# Patient Record
Sex: Female | Born: 1995 | Race: White | Hispanic: No | Marital: Married | Smoking: Former smoker
Health system: Southern US, Community
[De-identification: ages and names within clinical notes are randomized; demographics above are authoritative.]

## PROBLEM LIST (undated history)

## (undated) DIAGNOSIS — F329 Major depressive disorder, single episode, unspecified: Secondary | ICD-10-CM

## (undated) DIAGNOSIS — I10 Essential (primary) hypertension: Secondary | ICD-10-CM

## (undated) DIAGNOSIS — N200 Calculus of kidney: Secondary | ICD-10-CM

## (undated) DIAGNOSIS — R51 Headache: Secondary | ICD-10-CM

## (undated) DIAGNOSIS — N2589 Other disorders resulting from impaired renal tubular function: Secondary | ICD-10-CM

## (undated) DIAGNOSIS — K219 Gastro-esophageal reflux disease without esophagitis: Secondary | ICD-10-CM

## (undated) DIAGNOSIS — Z973 Presence of spectacles and contact lenses: Secondary | ICD-10-CM

## (undated) DIAGNOSIS — F419 Anxiety disorder, unspecified: Secondary | ICD-10-CM

## (undated) DIAGNOSIS — G43909 Migraine, unspecified, not intractable, without status migrainosus: Secondary | ICD-10-CM

## (undated) DIAGNOSIS — F32A Depression, unspecified: Secondary | ICD-10-CM

## (undated) DIAGNOSIS — F411 Generalized anxiety disorder: Secondary | ICD-10-CM

## (undated) DIAGNOSIS — Q8740 Marfan's syndrome, unspecified: Secondary | ICD-10-CM

## (undated) DIAGNOSIS — Z87448 Personal history of other diseases of urinary system: Secondary | ICD-10-CM

## (undated) DIAGNOSIS — Q87410 Marfan's syndrome with aortic dilation: Secondary | ICD-10-CM

## (undated) DIAGNOSIS — Z8279 Family history of other congenital malformations, deformations and chromosomal abnormalities: Secondary | ICD-10-CM

## (undated) DIAGNOSIS — E282 Polycystic ovarian syndrome: Secondary | ICD-10-CM

## (undated) DIAGNOSIS — Z87442 Personal history of urinary calculi: Secondary | ICD-10-CM

## (undated) DIAGNOSIS — R519 Headache, unspecified: Secondary | ICD-10-CM

## (undated) DIAGNOSIS — G43101 Migraine with aura, not intractable, with status migrainosus: Secondary | ICD-10-CM

## (undated) DIAGNOSIS — R011 Cardiac murmur, unspecified: Secondary | ICD-10-CM

## (undated) HISTORY — DX: Essential (primary) hypertension: I10

## (undated) HISTORY — PX: FRACTURE SURGERY: SHX138

## (undated) HISTORY — DX: Calculus of kidney: N20.0

## (undated) HISTORY — DX: Headache: R51

## (undated) HISTORY — DX: Headache, unspecified: R51.9

## (undated) HISTORY — DX: Migraine, unspecified, not intractable, without status migrainosus: G43.909

## (undated) HISTORY — PX: OTHER SURGICAL HISTORY: SHX169

## (undated) HISTORY — DX: Other disorders resulting from impaired renal tubular function: N25.89

## (undated) HISTORY — PX: CYSTOSCOPY WITH URETEROSCOPY AND STENT PLACEMENT: SHX6377

## (undated) HISTORY — DX: Depression, unspecified: F32.A

## (undated) HISTORY — DX: Major depressive disorder, single episode, unspecified: F32.9

---

## 2015-01-14 HISTORY — PX: EXTRACORPOREAL SHOCK WAVE LITHOTRIPSY: SHX1557

## 2017-04-08 ENCOUNTER — Ambulatory Visit (INDEPENDENT_AMBULATORY_CARE_PROVIDER_SITE_OTHER): Payer: No Typology Code available for payment source | Admitting: Family Medicine

## 2017-04-08 ENCOUNTER — Telehealth: Payer: Self-pay

## 2017-04-08 ENCOUNTER — Encounter: Payer: Self-pay | Admitting: Family Medicine

## 2017-04-08 VITALS — BP 122/82 | HR 104 | Temp 98.0°F | Ht 69.0 in | Wt 235.2 lb

## 2017-04-08 DIAGNOSIS — G43809 Other migraine, not intractable, without status migrainosus: Secondary | ICD-10-CM

## 2017-04-08 DIAGNOSIS — Z87442 Personal history of urinary calculi: Secondary | ICD-10-CM | POA: Diagnosis not present

## 2017-04-08 DIAGNOSIS — Z8279 Family history of other congenital malformations, deformations and chromosomal abnormalities: Secondary | ICD-10-CM | POA: Diagnosis not present

## 2017-04-08 DIAGNOSIS — G43909 Migraine, unspecified, not intractable, without status migrainosus: Secondary | ICD-10-CM | POA: Insufficient documentation

## 2017-04-08 MED ORDER — PROPRANOLOL HCL ER 60 MG PO CP24: 60.0000 mg | ORAL_CAPSULE | Freq: Every day | ORAL | 5 refills | Status: DC

## 2017-04-08 MED ORDER — SUMATRIPTAN SUCCINATE 100 MG PO TABS: 100.0000 mg | ORAL_TABLET | ORAL | 2 refills | Status: DC | PRN

## 2017-04-08 MED ORDER — ZOLMITRIPTAN 2.5 MG PO TABS: 2.5000 mg | ORAL_TABLET | ORAL | 2 refills | Status: DC | PRN

## 2017-04-08 MED FILL — PROPRANOLOL ER 60 MG CAP: 60 | 30 days supply | Qty: 30 | Fill #0

## 2017-04-08 MED FILL — SUMATRIPTAN SUCC 100 MG TAB: 100 | 30 days supply | Qty: 8 | Fill #0

## 2017-04-08 NOTE — Patient Instructions (Addendum)
Call Center for [facility] at [facility] at [phone] for an appointment.  They are located at [address], [address], [place], [place], [phone] (right across the hall from our office).  If you do not hear anything about your urology referral in the next 1-2 weeks, call our office and ask for an update. The echo appointment should be scheduled earlier.   Let us know if you need anything.

## 2017-04-08 NOTE — Progress Notes (Signed)
Pre visit review using our clinic review tool, if applicable. No additional management support is needed unless otherwise documented below in the visit note. 

## 2017-04-08 NOTE — Progress Notes (Signed)
Chief Complaint  Patient presents with  . Establish Care       New Patient Visit SUBJECTIVE: HPI: Carol Pugh is an 22 y.o.female who is being seen for establishing care.  [facility].  The patient has a history of kidney stones.  She has had 23 symptomatic stones in her life.  She follows with urology for this issue.  Her previous urologist had her on potassium citrate which she did not feel was helpful.  She is not following up with that provider because her insurance changed.  She would like another referral.  She does not have an active stone that is passing.  She does report 5 stones that are a symptomatic.  Her father has a history of Marfan syndrome.  She did not undergo genetic testing, however does show some aspects of the disease while she lacks others.  The cardiology team has been monitoring bi annual echocardiograms.  She is wondering if I can start managing this or refer her back to a cardiologist.  The cardiology team also was prescribing propanolol which helped for both migraines and blood pressure/tachycardia.  No Known Allergies  Past Medical History:  Diagnosis Date  . Depression   . Distal renal tubular acidosis   . Headache   . Hypertension   . Kidney stones   . Migraine    Past Surgical History:  Procedure Laterality Date  . NO PAST SURGERIES     Social History   Socioeconomic History  . Marital status: Married   Family History  Problem Relation Age of Onset  . Marfan syndrome Father      Current Outpatient Medications:  .  buPROPion (WELLBUTRIN XL) 150 MG 24 hr tablet, Take 150 mg by mouth daily., Disp: , Rfl:  .  EPINEPHrine 0.3 mg/0.3 mL IJ SOAJ injection, Inject into the muscle once., Disp: , Rfl:  .  Potassium Citrate 15 MEQ (1620 MG) TBCR, Take 2 by mouth twice daily, Disp: , Rfl:  .  propranolol ER (INDERAL LA) 60 MG 24 hr capsule, Take 1 capsule (60 mg total) by mouth daily., Disp: 30 capsule, Rfl: 5 .   SUMAtriptan (IMITREX) 100 MG tablet, Take 1 tablet (100 mg total) by mouth every 2 (two) hours as needed for migraine. May repeat in 2 hours if headache persists or recurs., Disp: 10 tablet, Rfl: 2  ROS Cardiovascular: Denies chest pain  Respiratory: Denies dyspnea   OBJECTIVE: BP 122/82 (BP Location: Left Arm, Patient Position: Sitting, Cuff Size: Large)   Pulse (!) 104   Temp 98 F (36.7 C) (Oral)   Ht 5\' 9"  (1.753 m)   Wt 235 lb 4 oz (106.7 kg)   SpO2 96%   BMI 34.74 kg/m   Constitutional: -  VS reviewed -  Well developed, well nourished, appears stated age  -She does not appear to have a Marfan habitus -  No apparent distress  Psychiatric: -  Oriented to person, place, and time -  Memory intact -  Affect and mood normal -  Fluent conversation, good eye contact -  Judgment and insight age appropriate  Eye: -  Conjunctivae clear, no discharge -  Pupils symmetric, round, reactive to light  ENMT: -  MMM    Pharynx moist, no exudate, no erythema  Neck: -  No gross swelling, no palpable masses -  Thyroid midline, not enlarged, mobile, no palpable masses  Cardiovascular: -  RRR, no bruits -  No LE edema  Respiratory: -  Normal respiratory effort, no accessory muscle use, no retraction -  Breath sounds equal, no wheezes, no ronchi, no crackles  Musculoskeletal: -  No clubbing, no cyanosis -  Gait normal  Skin: -  No significant lesion on inspection -  Warm and dry to palpation   ASSESSMENT/PLAN: History of kidney stones - Plan: Ambulatory referral to Urology  Family history of Marfan syndrome - Plan: ECHOCARDIOGRAM COMPLETE  Other migraine without status migrainosus, not intractable - Plan: propranolol ER (INDERAL LA) 60 MG 24 hr capsule  Patient instructed to sign release of records form from her previous PCP. Refer to urology, check echo, restart propranolol. Patient should return at earliest convenience for complete physical.  The patient voiced understanding and  agreement to the plan.   Jilda Roche [place], DO 04/08/17  4:47 PM

## 2017-04-08 NOTE — Telephone Encounter (Signed)
Zolmitriptan not covered. Preferred alternatives: sumatriptan or rizatriptan.

## 2017-04-13 ENCOUNTER — Encounter: Payer: Self-pay | Admitting: Family Medicine

## 2017-04-16 ENCOUNTER — Ambulatory Visit (HOSPITAL_BASED_OUTPATIENT_CLINIC_OR_DEPARTMENT_OTHER)
Admission: RE | Admit: 2017-04-16 | Discharge: 2017-04-16 | Disposition: A | Discharge status: Home / Self Care | Payer: No Typology Code available for payment source | Source: Ambulatory Visit | Attending: Family Medicine | Admitting: Family Medicine

## 2017-04-16 DIAGNOSIS — I10 Essential (primary) hypertension: Secondary | ICD-10-CM | POA: Insufficient documentation

## 2017-04-16 DIAGNOSIS — Z8279 Family history of other congenital malformations, deformations and chromosomal abnormalities: Secondary | ICD-10-CM | POA: Diagnosis not present

## 2017-04-16 NOTE — Progress Notes (Signed)
  Echocardiogram 2D Echocardiogram has been performed.  Carol Pugh Carol Pugh 04/16/2017, 2:56 PM

## 2017-06-02 ENCOUNTER — Other Ambulatory Visit: Payer: Self-pay | Admitting: Urology

## 2017-06-04 ENCOUNTER — Ambulatory Visit (INDEPENDENT_AMBULATORY_CARE_PROVIDER_SITE_OTHER): Payer: No Typology Code available for payment source | Admitting: Medical

## 2017-06-04 ENCOUNTER — Encounter (HOSPITAL_COMMUNITY): Payer: Self-pay | Admitting: *Deleted

## 2017-06-04 ENCOUNTER — Encounter: Payer: Self-pay | Admitting: Medical

## 2017-06-04 ENCOUNTER — Other Ambulatory Visit: Payer: Self-pay

## 2017-06-04 VITALS — BP 123/83 | HR 68 | Temp 97.9°F | Resp 16 | Ht 69.0 in | Wt 247.8 lb

## 2017-06-04 DIAGNOSIS — R002 Palpitations: Secondary | ICD-10-CM

## 2017-06-04 DIAGNOSIS — Z01818 Encounter for other preprocedural examination: Secondary | ICD-10-CM | POA: Diagnosis not present

## 2017-06-04 NOTE — Patient Instructions (Addendum)
Your exam today and EKG look good.  Preop EKG showed normal sinus rhythm.  No abnormality.  However on further discussion your events of pulse rate randomly reaching 170 and other times as low as 39-40 are of concern.  I would recommend that you stay on your low-dose beta-blocker.  But would be hesitant to increase dose as you do report some events of bradycardia.  I went ahead and place referral for cardiology and asked that they can see you Tuesday of this coming week.  I am hoping that we can get you in quickly but we might have to delay clearance for your urologic procedure as we need to get full understanding regarding your erratic heart rate/palpitations.  I am going to ask [facility] our referal coordinator to work on Geneticist, molecular tomorrow.  Hopefully will give you update  by tomorrow.  However I do not know if cardiologist office is open tomorrow.  Follow-up date to be determined,  Also I think you might benefit from finger pulse ox monitor.  If you are at work would recommend checking your pulse if you become symptomatic.  If any severe low pulse rate or high pulse rate with severe symptoms and recommend ED evaluation.

## 2017-06-04 NOTE — Progress Notes (Signed)
Subjective:    Patient ID: Carol Pugh, female    DOB: 10-06-1995, 22 y.o.   MRN: [id number]  HPI  Pt is a nurse in NICU.   Pt in for evaluation. She is going to get lithotripsy done in about 2 weeks. She has hx of kidney stone.  Pt has history of kidney stones in past. Total number in past was 21. Pt has no current pain presently. But has known 6 mm stone on left side. Pt is planning to try to get pregnant in near future and wants to avoid potential problems with stone in event stone were to move while she was pregnant.  Pt had pregnancy test on Monday and was negative(at urologist office) Pt states her cycle are very irregular in the past. Sometimes very frequent and some  2 months between cycles.  No history of asthma, no wheezing or diagnosed cardiac problems. Pt dad had marfans. Pt had work up and no known abnormality. Work up included echo.Echo reviewed today.  No bruising or bleeding disorders.   Pt dad passed away from aneurysm of the heart. He had a lot of marfan findings.  Dr. Carmelia Roller made the referral for echo.  Pt states sometimes per her apple watch her pulse will increase to 170. Other times down to 40. She feels her heart palpitating occasionally. Happens every couple of day. These events are happening at rest. Pt does not drinking any coffee or sudafed.  Pt has been on propanolol for years since 22 yo. Bp has been well controlled now.   Pt states easily will see pulse range 140-160. Highest pulse 170. She will randomly see 39-40. These readings are on her aplewatch.   One event she thought she would pass out.  Pt can't wear her apple watch at work.    Review of Systems  Constitutional: Negative for chills, diaphoresis, fatigue and fever.  Respiratory: Negative for cough, chest tightness, shortness of breath and wheezing.   Cardiovascular: Positive for palpitations. Negative for chest pain and leg swelling.  Gastrointestinal: Negative for abdominal  distention, abdominal pain and anal bleeding.  Musculoskeletal: Negative for back pain.  Skin: Negative for rash.  Neurological: Negative for dizziness, weakness, numbness and headaches.  Hematological: Negative for adenopathy. Does not bruise/bleed easily.  Psychiatric/Behavioral: Negative for behavioral problems and confusion.    Past Medical History:  Diagnosis Date  . Depression   . Distal renal tubular acidosis   . Family history of Marfan syndrome   . Headache   . History of kidney stones   . Hypertension   . Kidney stones   . Migraine      Social History   Socioeconomic History  . Marital status: Married    Spouse name: Not on file  . Number of children: Not on file  . Years of education: Not on file  . Highest education level: Not on file  Occupational History  . Not on file  Social Needs  . Financial resource strain: Not on file  . Food insecurity:    Worry: Not on file    Inability: Not on file  . Transportation needs:    Medical: Not on file    Non-medical: Not on file  Tobacco Use  . Smoking status: Current Every Day Smoker    Packs/day: 0.50    Years: 5.00    Pack years: 2.50    Types: Cigarettes  . Smokeless tobacco: Never Used  Substance and Sexual Activity  . Alcohol use:  Not Currently    Frequency: Never  . Drug use: Never  . Sexual activity: Not on file  Lifestyle  . Physical activity:    Days per week: Not on file    Minutes per session: Not on file  . Stress: Not on file  Relationships  . Social connections:    Talks on phone: Not on file    Gets together: Not on file    Attends religious service: Not on file    Active member of club or organization: Not on file    Attends meetings of clubs or organizations: Not on file    Relationship status: Not on file  . Intimate partner violence:    Fear of current or ex partner: Not on file    Emotionally abused: Not on file    Physically abused: Not on file    Forced sexual activity: Not on  file  Other Topics Concern  . Not on file  Social History Narrative  . Not on file    Past Surgical History:  Procedure Laterality Date  . urinary stent placements      Family History  Problem Relation Age of Onset  . Marfan syndrome Father     No Known Allergies  Current Outpatient Medications on File Prior to Visit  Medication Sig Dispense Refill  . buPROPion (WELLBUTRIN XL) 150 MG 24 hr tablet Take 150 mg by mouth daily.    Marland Kitchen EPINEPHrine 0.3 mg/0.3 mL IJ SOAJ injection Inject into the muscle once.    . Potassium Citrate 15 MEQ (1620 MG) TBCR Take 2 by mouth twice daily    . propranolol ER (INDERAL LA) 60 MG 24 hr capsule Take 1 capsule (60 mg total) by mouth daily. 30 capsule 5  . SUMAtriptan (IMITREX) 100 MG tablet Take 1 tablet (100 mg total) by mouth every 2 (two) hours as needed for migraine. May repeat in 2 hours if headache persists or recurs. 10 tablet 2  . aspirin-acetaminophen-caffeine (EXCEDRIN MIGRAINE) 250-250-65 MG tablet Take 2 tablets by mouth every 6 (six) hours as needed for headache.     No current facility-administered medications on file prior to visit.     BP 123/83   Pulse 68   Temp 97.9 F (36.6 C) (Oral)   Resp 16   Ht  (1.753 m)   Wt 247 lb 12.8 oz (112.4 kg)   SpO2 100%   BMI 36.59 kg/m       Objective:   Physical Exam  General Mental Status- Alert. General Appearance- Not in acute distress.    Neck Carotid Arteries- Normal color. Moisture- Normal Moisture. No carotid bruits. No JVD.  Chest and Lung Exam Auscultation: Breath Sounds:-Normal.  Cardiovascular Auscultation:Rythm- Regular. Murmurs & Other Heart Sounds:Auscultation of the heart reveals- No Murmurs.  Abdomen Inspection:-Inspeection Normal. Palpation/Percussion:Note:No mass. Palpation and Percussion of the abdomen reveal- Non Tender, Non Distended + BS, no rebound or guarding.   Neurologic Cranial Nerve exam:- CN III-XII intact(No nystagmus), symmetric  smile. Strength:- 5/5 equal and symmetric strength both upper and lower extremities.  Lower ext- no pedal edema. Symmetric calves.    Assessment & Plan:  Your exam today and EKG look good.  Preop EKG showed normal sinus rhythm.  No abnormality.  However on further discussion your events of pulse rate randomly reaching 170 and other times as low as 39-40 are of concern.  I would recommend that you stay on your low-dose beta-blocker.  But would be hesitant to increase  dose as you do report some events of bradycardia.  I went ahead and place referral for cardiology and asked that they can see you Tuesday of this coming week.  I am hoping that we can get you in quickly but we might have to delay clearance for your urologic procedure as we need to get full understanding regarding your erratic heart rate/palpitations.  I am going to ask [facility] our referal coordinator to work on Geneticist, molecular tomorrow.  Hopefully will give you update  by tomorrow.  However I do not know if cardiologist office is open tomorrow.  Follow-up date to be determined,  Also I think you might benefit from finger pulse ox monitor.  If you are at work would recommend checking your pulse if you become symptomatic.  If any severe low pulse rate or high pulse rate with severe symptoms and recommend ED evaluation.  Esperanza Richters, PA-C

## 2017-06-09 ENCOUNTER — Encounter: Payer: Self-pay | Admitting: Cardiology

## 2017-06-09 ENCOUNTER — Ambulatory Visit (INDEPENDENT_AMBULATORY_CARE_PROVIDER_SITE_OTHER): Payer: No Typology Code available for payment source | Admitting: Cardiology

## 2017-06-09 VITALS — BP 126/82 | HR 79 | Ht 69.0 in | Wt 248.0 lb

## 2017-06-09 DIAGNOSIS — Z8279 Family history of other congenital malformations, deformations and chromosomal abnormalities: Secondary | ICD-10-CM

## 2017-06-09 DIAGNOSIS — I10 Essential (primary) hypertension: Secondary | ICD-10-CM | POA: Insufficient documentation

## 2017-06-09 DIAGNOSIS — R000 Tachycardia, unspecified: Secondary | ICD-10-CM

## 2017-06-09 DIAGNOSIS — Z87442 Personal history of urinary calculi: Secondary | ICD-10-CM | POA: Diagnosis not present

## 2017-06-09 NOTE — Patient Instructions (Signed)
Medication Instructions:  Your physician recommends that you continue on your current medications as directed. Please refer to the Current Medication list given to you today.  Labwork: None  Testing/Procedures: Your physician has recommended that you wear a holter monitor. Holter monitors are medical devices that record the heart's electrical activity. Doctors most often use these monitors to diagnose arrhythmias. Arrhythmias are problems with the speed or rhythm of the heartbeat. The monitor is a small, portable device. You can wear one while you do your normal daily activities. This is usually used to diagnose what is causing palpitations/syncope (passing out).  Follow-Up: Your physician recommends that you schedule a follow-up appointment in: 3 months  Any Other Special Instructions Will Be Listed Below (If Applicable).     If you need a refill on your cardiac medications before your next appointment, please call your pharmacy.   [facility]  Garey Ham, RN, BSN

## 2017-06-09 NOTE — Progress Notes (Signed)
Cardiology Office Note:    Date:  06/09/2017   ID:  Carol Pugh, DOB 11/25/1995, MRN [id number]  PCP:  Sharlene Dory, DO  Cardiologist:  Garwin Brothers, MD   Referring MD: Esperanza Richters, PA-C    ASSESSMENT:    1. Tachycardia   2. Essential hypertension   3. History of kidney stones   4. Family history of Marfan syndrome    PLAN:    In order of problems listed above:  1. I reassured the patient about my findings.  In view of history of Marfan's syndrome in the family, I reviewed her echocardiogram done recently and that was unremarkable.  Her blood pressure and heart rates are stable.  In view of her her mentioning that her heart rate gets low at times I will do a 48-hour Holter monitor.  I do not think that this should cause any source of concern as far as I for her lithotripsy and she should be good to go ahead with it. 2. Patient will be seen in follow-up appointment in 3 months or earlier if the patient has any concerns    Medication Adjustments/Labs and Tests Ordered: Current medicines are reviewed at length with the patient today.  Concerns regarding medicines are outlined above.  Orders Placed This Encounter  Procedures  . HOLTER MONITOR - 48 HOUR   No orders of the defined types were placed in this encounter.    History of Present Illness:    Carol Pugh is a 22 y.o. female who is being seen today for the evaluation of palpitations and family history of Marfan syndrome at the request of [place], [place]on [place], [place].  Patient is a pleasant 21 year old female.  She has past medical history of essential hypertension.  She also has history of resting tachycardia and therefore she is on a beta-blocker since the past 4 or 5 years approximately.  She mentions to me that she tolerates this medication well and has had no issues with palpitations or tachycardia.  She was told that she has resting elevated heart rate in the past and the beta-blocker has helped her.   Now she mentions to me that she is planning to undergo lithotripsy.  She tells me that on her apple watch occasionally her heart rate will go down in the 40s.  She is completely asymptomatic from this.  No chest pain orthopnea or PND.  No history of palpitations.  No history of syncope.  At the time of my evaluation, the patient is alert awake oriented and in no distress.  Past Medical History:  Diagnosis Date  . Depression   . Distal renal tubular acidosis   . Family history of Marfan syndrome   . Headache   . History of kidney stones   . Hypertension   . Kidney stones   . Migraine     Past Surgical History:  Procedure Laterality Date  . urinary stent placements      Current Medications: Current Meds  Medication Sig  . aspirin-acetaminophen-caffeine (EXCEDRIN MIGRAINE) 250-250-65 MG tablet Take 2 tablets by mouth every 6 (six) hours as needed for headache.  Marland Kitchen buPROPion (WELLBUTRIN XL) 150 MG 24 hr tablet Take 150 mg by mouth daily.  Marland Kitchen EPINEPHrine 0.3 mg/0.3 mL IJ SOAJ injection Inject into the muscle once.  . Potassium Citrate 15 MEQ (1620 MG) TBCR Take 2 by mouth twice daily  . propranolol ER (INDERAL LA) 60 MG 24 hr capsule Take 1 capsule (60 mg total) by  mouth daily.  . SUMAtriptan (IMITREX) 100 MG tablet Take 1 tablet (100 mg total) by mouth every 2 (two) hours as needed for migraine. May repeat in 2 hours if headache persists or recurs.     Allergies:   Patient has no known allergies.   Social History   Socioeconomic History  . Marital status: Married    Spouse name: Not on file  . Number of children: Not on file  . Years of education: Not on file  . Highest education level: Not on file  Occupational History  . Not on file  Social Needs  . Financial resource strain: Not on file  . Food insecurity:    Worry: Not on file    Inability: Not on file  . Transportation needs:    Medical: Not on file    Non-medical: Not on file  Tobacco Use  . Smoking status: Current  Every Day Smoker    Packs/day: 0.50    Years: 5.00    Pack years: 2.50    Types: Cigarettes  . Smokeless tobacco: Never Used  Substance and Sexual Activity  . Alcohol use: Not Currently    Frequency: Never  . Drug use: Never  . Sexual activity: Not on file  Lifestyle  . Physical activity:    Days per week: Not on file    Minutes per session: Not on file  . Stress: Not on file  Relationships  . Social connections:    Talks on phone: Not on file    Gets together: Not on file    Attends religious service: Not on file    Active member of club or organization: Not on file    Attends meetings of clubs or organizations: Not on file    Relationship status: Not on file  Other Topics Concern  . Not on file  Social History Narrative  . Not on file     Family History: The patient's family history includes Marfan syndrome in her father.  ROS:   Please see the history of present illness.    All other systems reviewed and are negative.  EKGs/Labs/Other Studies Reviewed:    The following studies were reviewed today: EKG reveals sinus rhythm and nonspecific ST changes.   Recent Labs: No results found for requested labs within last 8760 hours.  Recent Lipid Panel No results found for: CHOL, TRIG, HDL, CHOLHDL, VLDL, LDLCALC, LDLDIRECT  Physical Exam:    VS:  BP 126/82 (BP Location: Left Arm, Patient Position: Sitting, Cuff Size: Normal)   Pulse 79   Ht  (1.753 m)   Wt 248 lb (112.5 kg)   SpO2 98%   BMI 36.62 kg/m     Wt Readings from Last 3 Encounters:  06/09/17 248 lb (112.5 kg)  06/04/17 247 lb 12.8 oz (112.4 kg)  04/08/17 235 lb 4 oz (106.7 kg)     GEN: Patient is in no acute distress HEENT: Normal NECK: No JVD; No carotid bruits LYMPHATICS: No lymphadenopathy CARDIAC: S1 S2 regular, 2/6 systolic murmur at the apex. RESPIRATORY:  Clear to auscultation without rales, wheezing or rhonchi  ABDOMEN: Soft, non-tender, non-distended MUSCULOSKELETAL:  No edema;  No deformity  SKIN: Warm and dry NEUROLOGIC:  Alert and oriented x 3 PSYCHIATRIC:  Normal affect    Signed, Garwin Brothers, MD  06/09/2017 2:54 PM    [facility]

## 2017-06-10 NOTE — H&P (Signed)
Office Visit Report     06/01/2017   --------------------------------------------------------------------------------   Carol Pugh  MRN: [id number]  PRIMARY CARE:  Arva Chafe, MD  DOB: July 20, 1995, 22 year old Female  REFERRING:    SSN: [id number]  PROVIDER:  Jerilee Field, M.D.    LOCATION:  [facility] [phone]   --------------------------------------------------------------------------------   CC: I have kidney stones.  HPI: Carol Pugh is a 22 year-old female established patient who is here for renal calculi.  The problem is on both sides. This is not her first kidney stone.   She has had ureteroscopy for treatment of her stones in the past.   Patient has a long history of kidney stones. 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 Stones are calcium oxalate and calcium phosphate.   She underwent a stent and ESWL in 2017 (less pain). She doesn't recall which side. She underwent right ureteroscopy June 2018 and stent (more pain).   She's been treated with hydration and potassium citrate. She was on 30 mEq potassium citrate twice daily (15 mEq, two tabs, twice a day). Urine pH = 6. Imaging was a KUB summer of 2018 elsewhere and she considered treating the left-sided stones (largest 6 mm, but pt thought there were 11 stones) before she starts her family planning. She's a nurse in NICU.   She returns and UA is clear. KUB today reveals an 8 mm stone in the left kidney with a 2 mm stone just above it. She is well today. She wants to have kids later this year. UA clear.     ALLERGIES: None   MEDICATIONS: Epi Pen  Imitrex 100 mg tablet  Potassium Citrate Er 15 meq (1,620 mg) tablet, extended release  Propranolol Hcl Er 60 mg capsule, extended release 24hr  Wellbutrin Xl     GU PSH: None     PSH Notes: ureteroscopy  oral surgery     NON-GU PSH: None   GU PMH: Renal calculus - 04/17/2017    NON-GU  PMH: Cardiac murmur, unspecified Depression Hypertension    FAMILY HISTORY: Aortic Aneurysm - Father Kidney Stones - Grandmother   SOCIAL HISTORY: Marital Status: Married Preferred Language: English; Race: White Current Smoking Status: Patient smokes.   Tobacco Use Assessment Completed: Used Tobacco in last 30 days? Drinks 3 caffeinated drinks per day. Patient's occupation Education administrator.    REVIEW OF SYSTEMS:    GU Review Female:   Patient reports get up at night to urinate. Patient denies frequent urination, hard to postpone urination, burning /pain with urination, leakage of urine, stream starts and stops, trouble starting your stream, have to strain to urinate, and being pregnant.  Gastrointestinal (Upper):   Patient denies nausea, vomiting, and indigestion/ heartburn.  Gastrointestinal (Lower):   Patient denies diarrhea and constipation.  Constitutional:   Patient denies fever, night sweats, weight loss, and fatigue.  Skin:   Patient denies skin rash/ lesion and itching.  Eyes:   Patient denies blurred vision and double vision.  Ears/ Nose/ Throat:   Patient denies sore throat and sinus problems.  Hematologic/Lymphatic:   Patient denies swollen glands and easy bruising.  Cardiovascular:   Patient denies leg swelling and chest pains.  Respiratory:   Patient denies cough and shortness of breath.  Endocrine:   Patient denies excessive thirst.  Musculoskeletal:   Patient denies back pain and joint pain.  Neurological:   Patient denies headaches and dizziness.  Psychologic:  Patient denies depression and anxiety.   VITAL SIGNS:      06/01/2017 10:06 AM  BP 133/86 mmHg  Pulse 80 /min  Temperature 98.1 F / 36.7 C   MULTI-SYSTEM PHYSICAL EXAMINATION:    Constitutional: Well-nourished. No physical deformities. Normally developed. Good grooming.  Neck: Neck symmetrical, not swollen. Normal tracheal position.  Respiratory: No labored breathing, no use of accessory muscles. Normal  breath sounds.  Cardiovascular: Regular rate and rhythm. Normal temperature, normal extremity pulses, no swelling, no varicosities.   Neurologic / Psychiatric: Oriented to time, oriented to place, oriented to person. No depression, no anxiety, no agitation.  Gastrointestinal: No mass, no tenderness, no rigidity, non obese abdomen.     PAST DATA REVIEWED:  Source Of History:  Patient   PROCEDURES:         KUB - [id number]  A single view of the abdomen is obtained.  Calculi:  Middle pole left kidney calculi -- 7 mm, 2 mm superior and adjacent.       The bones appeared normal. The bowel gas pattern appeared normal. The soft tissues were unremarkable.         Urinalysis Dipstick Dipstick Cont'd  Color: Yellow Bilirubin: Neg  Appearance: Clear Ketones: Neg  Specific Gravity: 1.020 Blood: Neg  pH: 6.0 Protein: Neg  Glucose: Neg Urobilinogen: 0.2    Nitrites: Neg    Leukocyte Esterase: Neg    ASSESSMENT:      ICD-10 Details  1 GU:   Renal calculus - N20.0    PLAN:           Schedule Return Visit/Planned Activity: Next Available Appointment - Schedule Surgery          Document Letter(s):  Created for Patient: Clinical Summary         Notes:   left renal stones - discussed she may have other early, small stones that aren't visible on KUB. She has a left 7 mm stone. I discussed with the patient the nature risks and benefits of continued surveillance, LEFT shockwave lithotripsy or ureteroscopy. Discussed limitations of each approach, expected success, complications and staged procedure use. All questions answered. She elects to proceed with LEFT ESWL. Discussed one of my colleagues may be doing the procedure.   cc: Dr. Carmelia Roller     * Signed by Jerilee Field, M.D. on 06/01/17 at 4:37 PM (EDT)*

## 2017-06-11 ENCOUNTER — Encounter (HOSPITAL_COMMUNITY)
Admission: RE | Disposition: A | Discharge status: Home / Self Care | Payer: Self-pay | Source: Other Acute Inpatient Hospital | Attending: Urology

## 2017-06-11 ENCOUNTER — Telehealth: Payer: Self-pay | Admitting: Family Medicine

## 2017-06-11 ENCOUNTER — Ambulatory Visit (HOSPITAL_COMMUNITY): Payer: No Typology Code available for payment source

## 2017-06-11 ENCOUNTER — Encounter (HOSPITAL_COMMUNITY): Payer: Self-pay | Admitting: General Practice

## 2017-06-11 ENCOUNTER — Ambulatory Visit (HOSPITAL_COMMUNITY)
Admission: RE | Admit: 2017-06-11 | Discharge: 2017-06-11 | Disposition: A | Discharge status: Home / Self Care | Payer: No Typology Code available for payment source | Source: Other Acute Inpatient Hospital | Attending: Urology | Admitting: Urology

## 2017-06-11 DIAGNOSIS — Z79899 Other long term (current) drug therapy: Secondary | ICD-10-CM | POA: Insufficient documentation

## 2017-06-11 DIAGNOSIS — Z87442 Personal history of urinary calculi: Secondary | ICD-10-CM | POA: Diagnosis not present

## 2017-06-11 DIAGNOSIS — N200 Calculus of kidney: Secondary | ICD-10-CM | POA: Diagnosis not present

## 2017-06-11 DIAGNOSIS — I10 Essential (primary) hypertension: Secondary | ICD-10-CM | POA: Diagnosis not present

## 2017-06-11 DIAGNOSIS — F329 Major depressive disorder, single episode, unspecified: Secondary | ICD-10-CM | POA: Diagnosis not present

## 2017-06-11 HISTORY — DX: Family history of other congenital malformations, deformations and chromosomal abnormalities: Z82.79

## 2017-06-11 HISTORY — DX: Personal history of urinary calculi: Z87.442

## 2017-06-11 HISTORY — PX: EXTRACORPOREAL SHOCK WAVE LITHOTRIPSY: SHX1557

## 2017-06-11 LAB — PREGNANCY, URINE: Preg Test, Ur: NEGATIVE

## 2017-06-11 SURGERY — LITHOTRIPSY, ESWL
Anesthesia: LOCAL | Laterality: Left

## 2017-06-11 MED ORDER — SODIUM CHLORIDE 0.9 % IV SOLN
INTRAVENOUS | Status: DC
  Administered 2017-06-11: 07:00:00 via INTRAVENOUS

## 2017-06-11 MED ORDER — CIPROFLOXACIN HCL 500 MG PO TABS
500.0000 mg | ORAL_TABLET | ORAL | Status: AC
  Administered 2017-06-11: 500 mg via ORAL
  Filled 2017-06-11: qty 1

## 2017-06-11 MED ORDER — DIAZEPAM 5 MG PO TABS
10.0000 mg | ORAL_TABLET | ORAL | Status: AC
  Administered 2017-06-11: 10 mg via ORAL
  Filled 2017-06-11: qty 2

## 2017-06-11 MED ORDER — DIPHENHYDRAMINE HCL 25 MG PO CAPS
25.0000 mg | ORAL_CAPSULE | ORAL | Status: AC
  Administered 2017-06-11: 25 mg via ORAL
  Filled 2017-06-11: qty 1

## 2017-06-11 MED FILL — TAMSULOSIN HCL 0.4 MG CAP: 0.4 | 30 days supply | Qty: 30 | Fill #0

## 2017-06-11 MED FILL — ONDANSETRON HCL 4 MG TABLET: 4 | 2 days supply | Qty: 10 | Fill #0

## 2017-06-11 MED FILL — OXYCODONE-ACETAMINOPHEN 5-3: 5-325 | 2 days supply | Qty: 16 | Fill #0

## 2017-06-11 NOTE — Discharge Instructions (Signed)
1. You should strain your urine and collect all fragments and bring them to your follow up appointment.  °2. You should take your pain medication as needed.  Please call if your pain is severe to the point that it is not controlled with your pain medication. °3. You should call if you develop fever > 101 or persistent nausea or vomiting. °4. Your doctor may prescribe tamsulosin to take to help facilitate stone passage. °

## 2017-06-11 NOTE — Telephone Encounter (Signed)
OK to place referral. TY.  

## 2017-06-11 NOTE — Telephone Encounter (Signed)
Copied from CRM [id number]. Topic: Referral - Request >> Jun 11, 2017  2:31 PM Floria Raveling A wrote: Reason for CRM:  Pt called in and stated that she has the focus plan with [facility] and needs ins referral to a Gyn.  She doesn't care which one she just needs one.  Pt has not had per period in 2 weeks, per pregnancy test they were negative   Best number  [phone]

## 2017-06-11 NOTE — Op Note (Signed)
Please see scanned chart for ESWL operative note. 

## 2017-06-11 NOTE — Interval H&P Note (Signed)
History and Physical Interval Note:  06/11/2017 8:02 AM  Carol Pugh  has presented today for surgery, with the diagnosis of LEFT KIDNEY STONE  The various methods of treatment have been discussed with the patient and family. After consideration of risks, benefits and other options for treatment, the patient has consented to  Procedure(s): LEFT EXTRACORPOREAL SHOCK WAVE LITHOTRIPSY (ESWL) (Left) as a surgical intervention .  The patient's history has been reviewed, patient examined, no change in status, stable for surgery.  I have reviewed the patient's chart and labs.  Questions were answered to the patient's satisfaction.     Nikolos Billig,LES

## 2017-06-12 ENCOUNTER — Encounter (HOSPITAL_COMMUNITY): Payer: Self-pay | Admitting: Urology

## 2017-06-12 ENCOUNTER — Other Ambulatory Visit: Payer: Self-pay | Admitting: Family Medicine

## 2017-06-12 DIAGNOSIS — N926 Irregular menstruation, unspecified: Secondary | ICD-10-CM

## 2017-06-12 NOTE — Telephone Encounter (Signed)
Referral done

## 2017-06-15 ENCOUNTER — Encounter: Payer: Self-pay | Admitting: Family Medicine

## 2017-06-15 ENCOUNTER — Encounter: Payer: Self-pay | Admitting: Medical

## 2017-06-15 ENCOUNTER — Other Ambulatory Visit (INDEPENDENT_AMBULATORY_CARE_PROVIDER_SITE_OTHER): Payer: No Typology Code available for payment source

## 2017-06-15 ENCOUNTER — Telehealth: Payer: Self-pay

## 2017-06-15 ENCOUNTER — Other Ambulatory Visit: Payer: Self-pay | Admitting: Family Medicine

## 2017-06-15 DIAGNOSIS — Z3200 Encounter for pregnancy test, result unknown: Secondary | ICD-10-CM | POA: Diagnosis not present

## 2017-06-15 LAB — HCG, QUANTITATIVE, PREGNANCY

## 2017-06-15 NOTE — Telephone Encounter (Signed)
Put in order/scheduled appt with the patient for today

## 2017-06-15 NOTE — Telephone Encounter (Signed)
OK, order POCT urine preg please.

## 2017-06-15 NOTE — Telephone Encounter (Signed)
Copied from CRM [id number]. Topic: Inquiry >> Jun 15, 2017 11:17 AM Alexander BergeronBarksdale, Harvey B wrote: Reason for CRM: pt called to ask if she can have lab orders created to have blood work done to check to see if she is pregnant, call pt to advise

## 2017-06-22 ENCOUNTER — Ambulatory Visit: Payer: No Typology Code available for payment source

## 2017-06-22 DIAGNOSIS — R002 Palpitations: Secondary | ICD-10-CM | POA: Diagnosis not present

## 2017-06-23 MED FILL — PROPRANOLOL ER 60 MG CAP: 60 | 30 days supply | Qty: 30 | Fill #1

## 2017-06-24 ENCOUNTER — Other Ambulatory Visit: Payer: Self-pay | Admitting: Obstetrics and Gynecology

## 2017-06-24 DIAGNOSIS — N921 Excessive and frequent menstruation with irregular cycle: Secondary | ICD-10-CM

## 2017-06-24 MED FILL — MEDROXYPROGESTERONE 10 MG T: 10 | 10 days supply | Qty: 10 | Fill #0

## 2017-06-29 ENCOUNTER — Ambulatory Visit (HOSPITAL_COMMUNITY)
Admission: RE | Admit: 2017-06-29 | Discharge: 2017-06-29 | Disposition: A | Discharge status: Home / Self Care | Payer: No Typology Code available for payment source | Source: Ambulatory Visit | Attending: Obstetrics and Gynecology | Admitting: Obstetrics and Gynecology

## 2017-06-29 DIAGNOSIS — N921 Excessive and frequent menstruation with irregular cycle: Secondary | ICD-10-CM | POA: Diagnosis present

## 2017-07-03 ENCOUNTER — Ambulatory Visit (INDEPENDENT_AMBULATORY_CARE_PROVIDER_SITE_OTHER): Payer: No Typology Code available for payment source | Admitting: Family Medicine

## 2017-07-03 ENCOUNTER — Encounter: Payer: Self-pay | Admitting: Family Medicine

## 2017-07-03 VITALS — BP 112/76 | HR 78 | Temp 98.2°F | Ht 69.0 in | Wt 252.4 lb

## 2017-07-03 DIAGNOSIS — Z0000 Encounter for general adult medical examination without abnormal findings: Secondary | ICD-10-CM

## 2017-07-03 MED FILL — SUMATRIPTAN SUCC 100 MG TAB: 100 | 30 days supply | Qty: 8 | Fill #1

## 2017-07-03 NOTE — Patient Instructions (Addendum)
Consider Pepcid (famotidine) 20 mg twice daily or Zantac (ranitidine) 150 mg twice daily to help. Omeprazole 20 mg twice daily is a stronger alternative.   The only lifestyle changes that have data behind them are weight loss for the overweight/obese and elevating the head of the bed. Finding out which foods/positions are triggers is important.  Keep the diet clean and stay physically active.  Let us know if you need anything.

## 2017-07-03 NOTE — Progress Notes (Signed)
Pre visit review using our clinic review tool, if applicable. No additional management support is needed unless otherwise documented below in the visit note. 

## 2017-07-03 NOTE — Progress Notes (Signed)
Chief Complaint  Patient presents with  . Annual Exam     Well Woman Carol Pugh is here for a complete physical.   Her last physical was >1 year ago.  Current diet: in general, trying to improve diet. Current exercise: none currently will walk dog. No LMP recorded. Seatbelt? Yes  Health Maintenance Pap/HPV- Yes 08/13/2016 Tetanus- Yes - 11-13-16 HIV screening- Yes -08/13/17 STI screening- Yes - 08/13/17  Past Medical History:  Diagnosis Date  . Depression   . Distal renal tubular acidosis   . Family history of Marfan syndrome   . Headache   . History of kidney stones   . Hypertension   . Kidney stones   . Migraine      Past Surgical History:  Procedure Laterality Date  . EXTRACORPOREAL SHOCK WAVE LITHOTRIPSY Left 06/11/2017   Procedure: LEFT EXTRACORPOREAL SHOCK WAVE LITHOTRIPSY (ESWL);  Surgeon: Heloise Purpura, MD;  Location: [facility];  Service: Urology;  Laterality: Left;  . urinary stent placements      Medications  Current Outpatient Medications on File Prior to Visit  Medication Sig Dispense Refill  . buPROPion (WELLBUTRIN XL) 150 MG 24 hr tablet Take 150 mg by mouth daily.    Marland Kitchen EPINEPHrine 0.3 mg/0.3 mL IJ SOAJ injection Inject into the muscle once.    . Potassium Citrate 15 MEQ (1620 MG) TBCR Take 2 by mouth twice daily    . propranolol ER (INDERAL LA) 60 MG 24 hr capsule Take 1 capsule (60 mg total) by mouth daily. 30 capsule 5  . SUMAtriptan (IMITREX) 100 MG tablet Take 1 tablet (100 mg total) by mouth every 2 (two) hours as needed for migraine. May repeat in 2 hours if headache persists or recurs. 10 tablet 2   Allergies No Known Allergies  Review of Systems: Constitutional:  no unexpected weight changes Eye:  no recent significant change in vision Ear/Nose/Mouth/Throat:  Ears:  no tinnitus or vertigo and no recent change in hearing Nose/Mouth/Throat:  no complaints of nasal congestion, no sore throat Cardiovascular: no chest pain Respiratory:  +nighttime  cough, no shortness of breath Gastrointestinal:  no abdominal pain, no change in bowel habits GU:  Female: negative for dysuria or pelvic pain Musculoskeletal/Extremities:  no pain of the joints Integumentary (Skin/Breast):  no abnormal skin lesions reported Neurologic:  no headaches Endocrine:  denies fatigue Hematologic/Lymphatic:  No areas of easy bleeding  Exam BP 112/76 (BP Location: Left Arm, Patient Position: Sitting, Cuff Size: Large)   Pulse 78   Temp 98.2 F (36.8 C) (Oral)   Ht 5\' 9"  (1.753 m)   Wt 252 lb 6 oz (114.5 kg)   BMI 37.27 kg/m  General:  well developed, well nourished, in no apparent distress Skin:  no significant moles, warts, or growths Head:  no masses, lesions, or tenderness Eyes:  pupils equal and round, sclera anicteric without injection Ears:  canals without lesions, TMs shiny without retraction, no obvious effusion, no erythema Nose:  nares patent, septum midline, mucosa normal, and no drainage or sinus tenderness Throat/Pharynx:  lips and gingiva without lesion; tongue and uvula midline; non-inflamed pharynx; no exudates or postnasal drainage Neck: neck supple without adenopathy, thyromegaly, or masses Lungs:  clear to auscultation, breath sounds equal bilaterally, no respiratory distress Cardio:  regular rate and rhythm, no bruits, no LE edema Abdomen:  abdomen soft, nontender; bowel sounds normal; no masses or organomegaly Genital: Defer to GYN Musculoskeletal:  symmetrical muscle groups noted without atrophy or deformity Extremities:  no clubbing, cyanosis, or edema, no deformities, no skin discoloration Neuro:  gait normal; deep tendon reflexes normal and symmetric Psych: well oriented with normal range of affect and appropriate judgment/insight  Assessment and Plan  Well adult exam   Well 22 y.o. female. Counseled on diet and exercise. Had labs thru GYN. Try H2 blocker and/or PPI.  Follow up in 6 mo or prn. The patient voiced  understanding and agreement to the plan.  Jilda Rocheicholas Paul WessonWendling, DO 07/03/17 3:12 PM

## 2017-07-07 ENCOUNTER — Encounter: Payer: No Typology Code available for payment source | Admitting: Advanced Practice Midwife

## 2017-07-24 MED FILL — CLOMIPHENE CITRATE 50 MG TA: 50 | 5 days supply | Qty: 5 | Fill #0

## 2017-07-28 ENCOUNTER — Telehealth: Payer: Self-pay | Admitting: Family Medicine

## 2017-07-28 MED FILL — PROPRANOLOL ER 60 MG CAP: 60 | 30 days supply | Qty: 30 | Fill #2

## 2017-07-28 NOTE — Telephone Encounter (Signed)
Copied from CRM [id number]. Topic: Quick Communication - Rx Refill/Question >> Jul 28, 2017  9:21 AM Maia Pettiesrtiz, Kristie S wrote: Medication: buPROPion (WELLBUTRIN XL) 150 MG 24 hr tablet takes 1/day & Potassium Citrate 15 MEQ (1620 MG) TBCR takes 2 tabs 2/day Pt is out of medication and pharmacy does not have RX. She thought Dr. Carmelia RollerWendling was sending in after her appt in June. Please advise.  Has the patient contacted their pharmacy? yes Preferred Pharmacy (with phone number or street name): [facility] [place], [place] - [id number] [phone] (Phone) [phone] (Fax)

## 2017-07-29 MED ORDER — BUPROPION HCL ER (XL) 150 MG PO TB24: 150.0000 mg | ORAL_TABLET | Freq: Every day | ORAL | 1 refills | Status: DC

## 2017-07-29 MED ORDER — POTASSIUM CITRATE ER 15 MEQ (1620 MG) PO TBCR: EXTENDED_RELEASE_TABLET | ORAL | 1 refills | Status: DC

## 2017-07-29 MED FILL — buPROPion HCL ER (XL) 150 M: 150 | 90 days supply | Qty: 90 | Fill #0

## 2017-07-29 NOTE — Telephone Encounter (Signed)
Refill done.  

## 2017-08-31 MED FILL — PROPRANOLOL ER 60 MG CAP: 60 | 30 days supply | Qty: 30 | Fill #3

## 2017-09-07 ENCOUNTER — Ambulatory Visit (INDEPENDENT_AMBULATORY_CARE_PROVIDER_SITE_OTHER): Payer: No Typology Code available for payment source | Admitting: Family Medicine

## 2017-09-07 ENCOUNTER — Encounter: Payer: Self-pay | Admitting: Family Medicine

## 2017-09-07 VITALS — BP 108/72 | HR 98 | Temp 98.3°F | Ht 69.0 in | Wt 256.1 lb

## 2017-09-07 DIAGNOSIS — S83004A Unspecified dislocation of right patella, initial encounter: Secondary | ICD-10-CM | POA: Diagnosis not present

## 2017-09-07 MED ORDER — TRAMADOL HCL 50 MG PO TABS: 50.0000 mg | ORAL_TABLET | Freq: Three times a day (TID) | ORAL | 0 refills | Status: DC | PRN

## 2017-09-07 MED FILL — traMADol HCL 50 MG TABS: 50 | 5 days supply | Qty: 15 | Fill #0

## 2017-09-07 NOTE — Progress Notes (Signed)
Chief Complaint  Patient presents with  . Knee Pain    right knee    Subjective: Patient is a 22 y.o. female here for R knee pain.  Patient was at a wedding and her knee gave out and she fell down.  There was no injury specifically.  Her kneecap had dislocated laterally initially but was able to go back in.  It came out again and she went to the emergency department.  The provider in the emergency department tried to relocate it but it slid back out.  It was finally relocated under conscious sedation and remained in place with a pressure brace.  She was told she needs a follow-up for an orthopedic surgery referral.  She has been using hydrocodone for pain, however does make her drowsy.  She has done well on tramadol in the past.   ROS: MSK: +knee pain  Past Medical History:  Diagnosis Date  . Depression   . Distal renal tubular acidosis   . Family history of Marfan syndrome   . Headache   . History of kidney stones   . Hypertension   . Kidney stones   . Migraine     Objective: BP 108/72 (BP Location: Right Arm, Patient Position: Sitting, Cuff Size: Large)   Pulse 98   Temp 98.3 F (36.8 C) (Oral)   Ht 5\' 9"  (1.753 m)   Wt 256 lb 2 oz (116.2 kg)   SpO2 96%   BMI 37.82 kg/m  General: Awake, appears stated age Lungs: No accessory muscle use Neuro: Antalgic gait Psych: Age appropriate judgment and insight, normal affect and mood  Assessment and Plan: Dislocation of right patella, initial encounter - Plan: Ambulatory referral to Orthopedic Surgery, traMADol (ULTRAM) 50 MG tablet  Patient declined exam due to fear of re-aggravating her injury.  Short course of tramadol.  Okay to use hydrocodone at night.  Ice, ibuprofen, Tylenol, activity as tolerated. Follow-up as needed. The patient voiced understanding and agreement to the plan.  Jilda Rocheicholas Paul Big RunWendling, DO 09/07/17  4:46 PM

## 2017-09-07 NOTE — Progress Notes (Signed)
Pre visit review using our clinic review tool, if applicable. No additional management support is needed unless otherwise documented below in the visit note. 

## 2017-09-07 NOTE — Patient Instructions (Addendum)
Ice/cold pack over area for 10-15 min twice daily.  If you do not hear anything about your referral in the next few days, call our office and ask for an update.  OK to take Tylenol 1000 mg (2 extra strength tabs) or 975 mg (3 regular strength tabs) every 6 hours as needed.  Ibuprofen 400-600 mg (2-3 over the counter strength tabs) every 6 hours as needed for pain.  Let us know if you need anything.

## 2017-09-08 ENCOUNTER — Telehealth: Payer: Self-pay

## 2017-09-08 ENCOUNTER — Encounter: Payer: Self-pay | Admitting: Family Medicine

## 2017-09-08 NOTE — Telephone Encounter (Signed)
Copied from CRM #[id number]. Topic: Referral - Question >> Sep 08, 2017 11:23 AM Alexander, Amber L wrote: Reason for CRM:   Pt wants to know what orthopeadic surgeon she is being referred to. Pt can be reached at [phone] >> Sep 08, 2017 12:13 PM Fergerson, Tricia A, CMA wrote: See pt mychart message >> Sep 08, 2017  1:35 PM Barksdale, Harvey B wrote: Pt called to check any info about where the referral has went so that she can contact that office 

## 2017-09-08 NOTE — Telephone Encounter (Signed)
Copied from CRM [id number]. Topic: Referral - Question >> Sep 08, 2017 11:23 AM Baldo DaubAlexander, Amber L wrote: Reason for CRM:   Pt wants to know what orthopeadic surgeon she is being referred to. Pt can be reached at [phone] >> Sep 08, 2017 12:13 PM Kathi SimpersFergerson, Tricia A, CMA wrote: See pt mychart message >> Sep 08, 2017  1:35 PM Alexander BergeronBarksdale, Harvey B wrote: Pt called to check any info about where the referral has went so that she can contact that office

## 2017-09-08 NOTE — Telephone Encounter (Signed)
Routed to [facility] LLCGwen, Armed forces training and education officerreferral coordinator, to address.   Copied from CRM [id number]. Topic: Referral - Question >> Sep 08, 2017 11:23 AM Baldo DaubAlexander, Amber L wrote: Reason for CRM:   Pt wants to know what orthopeadic surgeon she is being referred to. Pt can be reached at [phone] >> Sep 08, 2017 12:13 PM Kathi SimpersFergerson, Tricia A, CMA wrote: See pt mychart message >> Sep 08, 2017  1:35 PM Alexander BergeronBarksdale, Harvey B wrote: Pt called to check any info about where the referral has went so that she can contact that office

## 2017-09-09 ENCOUNTER — Telehealth: Payer: Self-pay

## 2017-09-09 ENCOUNTER — Telehealth: Payer: Self-pay | Admitting: Family Medicine

## 2017-09-09 NOTE — Telephone Encounter (Signed)
I did not want to get an MRI from our end since she is going to see ortho. She had told me the ER suggested the orthopedic team may want to get one, which I agree that they may want to. TY.

## 2017-09-09 NOTE — Telephone Encounter (Signed)
Patient informed of PCP instructions. 

## 2017-09-09 NOTE — Telephone Encounter (Unsigned)
Copied from CRM [id number]. Topic: Referral - Question >> Sep 08, 2017 11:23 AM Baldo DaubAlexander, Amber L wrote: Reason for CRM:   Pt wants to know what orthopeadic surgeon she is being referred to. Pt can be reached at [phone] >> Sep 08, 2017 12:13 PM Kathi SimpersFergerson, Tricia A, CMA wrote: See pt mychart message >> Sep 08, 2017  1:35 PM Jonette EvaBarksdale, Harvey B wrote: Pt called to check any info about where the referral has went so that she can contact that office >> Sep 08, 2017  3:59 PM Terisa Starraylor, Brittany L wrote: [facility] called and said she is coming in on Thursday at 11:15. The patient called and told him that Dr Carmelia RollerWendling wanted her to have an MRI but they told her they can not do that on the first appointment. She did say that if Dr Carmelia RollerWendling wants that done to please call her. If you can not get in touch with her, and you want her to have the MRI first you can upload it in the referral section in epic.   Call back @ [phone]

## 2017-09-09 NOTE — Telephone Encounter (Signed)
Pt. calling, wanting clarification on whether or not Dr. Carmelia RollerWendling prefers pt. get her MRI done prior to appointment with [facility] at 11 tmr. 8/29. No MRI order found. Order able to be uploaded in referral section of Epic if Dr. Carmelia RollerWendling wants it done prior. Routed to Dr. Carmelia RollerWendling to advise.

## 2017-09-09 NOTE — Telephone Encounter (Signed)
Copied from CRM #[id number]. Topic: Referral - Question >> Sep 08, 2017 11:23 AM Alexander, Amber L wrote: Reason for CRM:   Pt wants to know what orthopeadic surgeon she is being referred to. Pt can be reached at [phone] >> Sep 08, 2017 12:13 PM Fergerson, Tricia A, CMA wrote: See pt mychart message >> Sep 08, 2017  1:35 PM Barksdale, Harvey B wrote: Pt called to check any info about where the referral has went so that she can contact that office >> Sep 08, 2017  3:59 PM Taylor, Brittany L wrote: Andrienne, with [facility] called and said she is coming in on Thursday at 11:15. The patient called and told him that Dr Wendling wanted her to have an MRI but they told her they can not do that on the first appointment. She did say that if Dr Wendling wants that done to please call her. If you can not get in touch with her, and you want her to have the MRI first you can upload it in the referral section in epic.   Call back @ [phone] 

## 2017-09-10 ENCOUNTER — Ambulatory Visit (INDEPENDENT_AMBULATORY_CARE_PROVIDER_SITE_OTHER): Payer: Self-pay

## 2017-09-10 ENCOUNTER — Ambulatory Visit (INDEPENDENT_AMBULATORY_CARE_PROVIDER_SITE_OTHER): Payer: No Typology Code available for payment source | Admitting: Surgery

## 2017-09-10 ENCOUNTER — Encounter (INDEPENDENT_AMBULATORY_CARE_PROVIDER_SITE_OTHER): Payer: Self-pay | Admitting: Surgery

## 2017-09-10 ENCOUNTER — Telehealth (INDEPENDENT_AMBULATORY_CARE_PROVIDER_SITE_OTHER): Payer: Self-pay | Admitting: Surgery

## 2017-09-10 ENCOUNTER — Ambulatory Visit (INDEPENDENT_AMBULATORY_CARE_PROVIDER_SITE_OTHER): Payer: Self-pay | Admitting: Orthopaedic Surgery

## 2017-09-10 VITALS — BP 139/101 | HR 95 | Ht 69.0 in | Wt 250.0 lb

## 2017-09-10 DIAGNOSIS — M25561 Pain in right knee: Secondary | ICD-10-CM

## 2017-09-10 DIAGNOSIS — S83104A Unspecified dislocation of right knee, initial encounter: Secondary | ICD-10-CM

## 2017-09-10 MED ORDER — HYDROCODONE-ACETAMINOPHEN 5-325 MG PO TABS: 1.0000 | ORAL_TABLET | Freq: Three times a day (TID) | ORAL | 0 refills | Status: DC | PRN

## 2017-09-10 NOTE — Telephone Encounter (Signed)
Per Zonia KiefJames Owens by text pt can use the note from hospital until follow up with Dr. August Saucerean to review MRI.

## 2017-09-10 NOTE — Telephone Encounter (Signed)
Patient needs a work note stating that she needs to be out of work until a certain date.  She received a note from the hospital to be out of work until the 6th.  CB#[phone].  Thank you.

## 2017-09-10 NOTE — Progress Notes (Signed)
Office Visit Note   Patient: Carol Pugh           Date of Birth: 01/12/1996           MRN: [id number] Visit Date: 09/10/2017              Requested by: Sharlene DoryWendling, Nicholas Paul, DO [address] [place], [place] [id number] PCP: Sharlene DoryWendling, Nicholas Paul, DO   Assessment & Plan: Visit Diagnoses:  1. Acute pain of right knee   2. Dislocation of right knee, initial encounter     Plan: With patient's knee injury I recommend getting a stat MRI to rule out medial patellofemoral ligament tear, ACL injury, meniscal injury.  Follow-up with Dr. August Saucerean next week to discuss results and treatment options.  Recommend that she stay in her knee immobilizer and use crutches.  Follow-Up Instructions: Return in about 1 week (around 09/17/2017) for with Dr August Saucerean to review MRI.   Orders:  Orders Placed This Encounter  Procedures  . XR Knee 1-2 Views Right  . MR Knee Right w/o contrast   No orders of the defined types were placed in this encounter.     Procedures: No procedures performed   Clinical Data: No additional findings.   Subjective: Chief Complaint  Patient presents with  . Right Knee - Follow-up    HPI 22 year old white female who is a [employer] employee comes in today with complaints of right knee pain and likely patella dislocation.  Patient states that Saturday, September 05, 2017 she was dancing at home and she felt her knee "pop" and went down to the floor.  States that she saw her kneecap shifted lateral.  She went to move her knee and patella reduced.  She continued to have ongoing pain.  EMS was called and when they got there she pivoted and her kneecap "dislocated" again.  She has never had any problems with her knee before this injury.  She was transported to the emergency room and states that she was given conscious sedation and patella again was reduced.  She was put in a knee immobilizer and advised to follow-up with orthopedics.  States that she had to go through  her primary care to get a referral to our office which caused the delay for her treatment by us. Review of Systems No current cardiac pulmonary GI GU issues  Objective: Vital Signs: BP (!) 139/101 (BP Location: Left Arm, Patient Position: Sitting)   Pulse 95   Ht 5\' 9"  (1.753 m)   Wt 250 lb (113.4 kg)   BMI 36.92 kg/m   Physical Exam  Constitutional: She is oriented to person, place, and time. No distress.  HENT:  Head: Normocephalic and atraumatic.  Eyes: Pupils are equal, round, and reactive to light. EOM are normal.  Pulmonary/Chest: She is in respiratory distress.  Musculoskeletal:  Right knee is swollen.  Knee is diffusely tender.  Markedly tender around the medial retinaculum.  I am unable to put patient's knee through range of motion or assess knee ligament stability due to her pain.  Calf is nontender.  Neurovascular intact.  Neurological: She is alert and oriented to person, place, and time.  Skin: Skin is warm and dry.    Ortho Exam  Specialty Comments:  No specialty comments available.  Imaging: No results found.   PMFS History: Patient Active Problem List   Diagnosis Date Noted  . Essential hypertension 06/09/2017  . History of kidney stones 04/08/2017  .  Family history of Marfan syndrome 04/08/2017  . Migraine 04/08/2017   Past Medical History:  Diagnosis Date  . Depression   . Distal renal tubular acidosis   . Family history of Marfan syndrome   . Headache   . History of kidney stones   . Hypertension   . Kidney stones   . Migraine     Family History  Problem Relation Age of Onset  . Marfan syndrome Father     Past Surgical History:  Procedure Laterality Date  . EXTRACORPOREAL SHOCK WAVE LITHOTRIPSY Left 06/11/2017   Procedure: LEFT EXTRACORPOREAL SHOCK WAVE LITHOTRIPSY (ESWL);  Surgeon: Heloise Purpura, MD;  Location: [facility];  Service: Urology;  Laterality: Left;  . urinary stent placements     Social History   Occupational History  . Not  on file  Tobacco Use  . Smoking status: Current Every Day Smoker    Packs/day: 0.50    Years: 5.00    Pack years: 2.50    Types: Cigarettes  . Smokeless tobacco: Never Used  Substance and Sexual Activity  . Alcohol use: Not Currently    Frequency: Never  . Drug use: Never  . Sexual activity: Not on file

## 2017-09-11 ENCOUNTER — Telehealth (INDEPENDENT_AMBULATORY_CARE_PROVIDER_SITE_OTHER): Payer: Self-pay | Admitting: Orthopedic Surgery

## 2017-09-11 NOTE — Telephone Encounter (Signed)
Patient called stating that she had an MRI at [facility] and wants to know if someone is going to read it and give her a call with the results before her MRI.  CB#[phone].  Thank you.

## 2017-09-11 NOTE — Telephone Encounter (Signed)
Please advise 

## 2017-09-15 ENCOUNTER — Telehealth (INDEPENDENT_AMBULATORY_CARE_PROVIDER_SITE_OTHER): Payer: Self-pay | Admitting: Orthopedic Surgery

## 2017-09-15 ENCOUNTER — Telehealth: Payer: Self-pay | Admitting: *Deleted

## 2017-09-15 NOTE — Telephone Encounter (Signed)
Patient called asked if Dr August Saucer can call her with the results of her MRI. Patient said the MRI was scheduled as (Stat) MRI. The number to contact patient is [phone]

## 2017-09-15 NOTE — Telephone Encounter (Signed)
IC s/w patient. Worked her in to see Dr August Saucer tomorrow a.m. Due to complexity of MRI results.

## 2017-09-15 NOTE — Telephone Encounter (Signed)
Copied from CRM [id number]. Topic: Referral - Request >> Sep 15, 2017  9:04 AM Gaynelle Adu wrote: Reason for CRM: Patient is calling to request another referral for a orthopeadic surgeon. She stated she will not be going back to [facility].  She is requesting her referral sent  to [facility]. Address: [address] [place]  Ph: [phone] She is requesting  a call back once the referral is sent. >> Sep 15, 2017 10:23 AM Leafy Ro wrote: Pt no longer needs the referral to [facility]

## 2017-09-15 NOTE — Telephone Encounter (Signed)
No she needs return office visit with Dr. August Saucer per my office note to review the study.

## 2017-09-16 ENCOUNTER — Encounter (INDEPENDENT_AMBULATORY_CARE_PROVIDER_SITE_OTHER): Payer: Self-pay | Admitting: Orthopedic Surgery

## 2017-09-16 ENCOUNTER — Telehealth (INDEPENDENT_AMBULATORY_CARE_PROVIDER_SITE_OTHER): Payer: Self-pay | Admitting: Orthopedic Surgery

## 2017-09-16 ENCOUNTER — Ambulatory Visit (INDEPENDENT_AMBULATORY_CARE_PROVIDER_SITE_OTHER): Payer: No Typology Code available for payment source | Admitting: Orthopedic Surgery

## 2017-09-16 ENCOUNTER — Telehealth: Payer: Self-pay | Admitting: Family Medicine

## 2017-09-16 ENCOUNTER — Encounter: Payer: Self-pay | Admitting: Family Medicine

## 2017-09-16 DIAGNOSIS — S83104A Unspecified dislocation of right knee, initial encounter: Secondary | ICD-10-CM

## 2017-09-16 NOTE — Telephone Encounter (Signed)
Patient left a message stating that she has a question about the rehab Dr. August Saucer is sending her to.  CB#[phone].  Thank you.

## 2017-09-16 NOTE — Telephone Encounter (Signed)
Working on getting patient brace. Patient advised will call her once I have heard back from [facility] about getting her a brace for her knee.

## 2017-09-16 NOTE — Telephone Encounter (Signed)
Copied from CRM [id number]. Topic: Quick Communication - See Telephone Encounter >> Sep 16, 2017  1:38 PM Raquel Sarna wrote: Pt had a visit with 1st referral - Dr. August Saucer - orthopedic surgeon. Pt wanting to know if she can have another referral for a second opinion.  Pt would also like to discuss what Dr. Carmelia Roller thinks on Dr. Diamantina Providence option.

## 2017-09-16 NOTE — Telephone Encounter (Signed)
The patient has appointment today with Dr. August Saucer.  She is keeping this appt. Due to her MRI results and the need to be seen asap. The problem she had was with the office staff, she stated she was not treated well.  One staff member called her back and apologized.  She will keep her appt with Dr. August Saucer, but most likely once treatment is complete will not return there for future referrals.

## 2017-09-16 NOTE — Telephone Encounter (Signed)
Patient returned call advised the [facility] is fine for therapy. Patient asked will she have the brace before her appointment on Monday? Patient asked for a call back concerning the brace. The number to contact patient is [phone]

## 2017-09-16 NOTE — Telephone Encounter (Signed)
That's fine. For my reference and future referrals, was there a specific issue?

## 2017-09-16 NOTE — Telephone Encounter (Signed)
Tried calling patient. No answer. Also responded to her my chart message asking her to call us back and let us know specifically where she would like to be referred for therapy.

## 2017-09-16 NOTE — Telephone Encounter (Signed)
Noted  

## 2017-09-17 ENCOUNTER — Telehealth: Payer: Self-pay | Admitting: Family Medicine

## 2017-09-17 ENCOUNTER — Telehealth (INDEPENDENT_AMBULATORY_CARE_PROVIDER_SITE_OTHER): Payer: Self-pay | Admitting: Orthopedic Surgery

## 2017-09-17 NOTE — Telephone Encounter (Signed)
IC s/w patient and advised I just have found a brace through [facility] but had to talk with Dr August Saucer to make sure that this is what he was wanting her to have. Will also ask Dr August Saucer if ok for patient to still participate in therapy on Monday even if she does not have the brace.

## 2017-09-17 NOTE — Telephone Encounter (Signed)
IC s/w patient and advised I just have found a brace through [facility] but had to talk with Dr Dean to make sure that this is what he was wanting her to have. Will also ask Dr Dean if ok for patient to still participate in therapy on Monday even if she does not have the brace.  

## 2017-09-17 NOTE — Telephone Encounter (Signed)
FYI

## 2017-09-17 NOTE — Telephone Encounter (Signed)
Patient called asked if there has been any word about the brace for her knee? Patient said she is having (PT) on Monday. The number to contact patient is [phone]

## 2017-09-17 NOTE — Telephone Encounter (Signed)
This was addressed in phone encounter

## 2017-09-17 NOTE — Telephone Encounter (Signed)
I reviewed her MyChart message and her relaying of the assessment/plan from Dr. August Saucer. I appreciate Dr. Diamantina Providence approach in trying to avoid surgery, but also understand where she is coming from. PT is not cheap. My recommendation would be to first let Dr. August Saucer know the concerns. My understanding is that when therapies/treatment like this have such a low success rate, "shared decision making" where the patient is very much involved with the next step, is much more important. If Dr. August Saucer is rigid in his plan, I think a second opinion is worthwhile given her concerns. TY.

## 2017-09-17 NOTE — Telephone Encounter (Signed)
Copied from CRM [id number]. Topic: Referral - Request >> Sep 17, 2017  9:41 AM Crist Infante wrote: Reason for CRM: pt states she has the [facility] plan and [facility], Dr August Saucer wants her to have PT. Her appt is Monday,9/09 at [facility] at [facility] At 1:30 pm Monday.  Pt needs before then for insurance purposes  Address: [address] [place], [place] [address] Phone: [phone]  Referral done already/spoke to the patient.

## 2017-09-17 NOTE — Addendum Note (Signed)
Addended by: Scharlene Gloss B on: 09/17/2017 10:16 AM   Modules accepted: Orders

## 2017-09-17 NOTE — Telephone Encounter (Signed)
Informed the patient of PCP instructions.  She will discuss at appointment tomorrow with Dr. August Saucer all of this. She has made a PT appt already for Monday and wants for insurance reason referral for PT to do done (will do referral)

## 2017-09-18 ENCOUNTER — Encounter (INDEPENDENT_AMBULATORY_CARE_PROVIDER_SITE_OTHER): Payer: Self-pay | Admitting: Orthopedic Surgery

## 2017-09-18 ENCOUNTER — Ambulatory Visit (INDEPENDENT_AMBULATORY_CARE_PROVIDER_SITE_OTHER): Payer: No Typology Code available for payment source | Admitting: Orthopedic Surgery

## 2017-09-18 NOTE — Progress Notes (Signed)
Office Visit Note   Patient: Carol Pugh           Date of Birth: 1995-08-04           MRN: [id number] Visit Date: 09/16/2017 Requested by: Sharlene Dory, DO [address] [place], [place] [address] PCP: Sharlene Dory, DO  Subjective: Chief Complaint  Patient presents with  . Right Knee - Follow-up    HPI: Patient presents for evaluation of right knee pain.  Date of injury was 09/05/2017.  She is had no prior patellar dislocation.  Her father did die of complications from Marfan's which was an aortic dissection.  She may have some incomplete penetrance of Marfan's.  She is 2 weeks out from injury.  She works as a Engineer, civil (consulting).  MRI scan shows evidence of patellar dislocation.  They also called ACL tear.  I think some of these fibers are intact but a little bit lax.              ROS: All systems reviewed are negative as they relate to the chief complaint within the history of present illness.  Patient denies  fevers or chills.   Assessment & Plan: Visit Diagnoses:  1. Dislocation of right knee, initial encounter     Plan: Impression is patellar dislocation right knee with pretty significant pain and reluctance to really do anything with the knee.  She is been in a knee immobilizer.  At her age and with the first time dislocation I think there is a chance that she may not require surgery.  Like to get her started in physical therapy with a patellar brace.  We will see her back in 2 weeks for clinical recheck.  Note out of work for 4 weeks.  Cannot really get a great enough exam of her today because of apprehension general reluctance to have the knee examined to make a great clinical assessment on the stability of the ACL.  We will try that again at her next clinic appointment.  Follow-Up Instructions: Return in about 2 weeks (around 09/30/2017).   Orders:  Orders Placed This Encounter  Procedures  . Ambulatory referral to Physical Therapy   No orders of  the defined types were placed in this encounter.     Procedures: No procedures performed   Clinical Data: No additional findings.  Objective: Vital Signs: There were no vitals taken for this visit.  Physical Exam:   Constitutional: Patient appears well-developed HEENT:  Head: Normocephalic Eyes:EOM are normal Neck: Normal range of motion Cardiovascular: Normal rate Pulmonary/chest: Effort normal Neurologic: Patient is alert Skin: Skin is warm Psychiatric: Patient has normal mood and affect    Ortho Exam: Ortho exam demonstrates mild effusion of the knee.  She does have some tenderness at the medial facet of the patella and medial pedal pulses palpable.  Beyond that assessment she really is reluctant to have any manipulation or examination done on the knee.  She does not seem to have much in terms of joint hyperlaxity the elbows wrist or fingers.  Specialty Comments:  No specialty comments available.  Imaging: No results found.   PMFS History: Patient Active Problem List   Diagnosis Date Noted  . Essential hypertension 06/09/2017  . History of kidney stones 04/08/2017  . Family history of Marfan syndrome 04/08/2017  . Migraine 04/08/2017   Past Medical History:  Diagnosis Date  . Depression   . Distal renal tubular acidosis   . Family history  of Marfan syndrome   . Headache   . History of kidney stones   . Hypertension   . Kidney stones   . Migraine     Family History  Problem Relation Age of Onset  . Marfan syndrome Father     Past Surgical History:  Procedure Laterality Date  . EXTRACORPOREAL SHOCK WAVE LITHOTRIPSY Left 06/11/2017   Procedure: LEFT EXTRACORPOREAL SHOCK WAVE LITHOTRIPSY (ESWL);  Surgeon: Heloise Purpura, MD;  Location: [facility];  Service: Urology;  Laterality: Left;  . urinary stent placements     Social History   Occupational History  . Not on file  Tobacco Use  . Smoking status: Current Every Day Smoker    Packs/day: 0.50     Years: 5.00    Pack years: 2.50    Types: Cigarettes  . Smokeless tobacco: Never Used  Substance and Sexual Activity  . Alcohol use: Not Currently    Frequency: Never  . Drug use: Never  . Sexual activity: Not on file

## 2017-09-18 NOTE — Telephone Encounter (Signed)
Talked with Dr August Saucer. Ok to proceed with PT even without brace. Alycia Rossetti still working with me to make arrangements to get brace for patient. I have advised patient of this as well.

## 2017-09-18 NOTE — Telephone Encounter (Signed)
Advised patient would call her as soon as I have brace in hand.

## 2017-09-21 ENCOUNTER — Ambulatory Visit: Payer: No Typology Code available for payment source | Admitting: Physical Therapy

## 2017-09-21 ENCOUNTER — Telehealth (INDEPENDENT_AMBULATORY_CARE_PROVIDER_SITE_OTHER): Payer: Self-pay | Admitting: Orthopedic Surgery

## 2017-09-21 ENCOUNTER — Ambulatory Visit (INDEPENDENT_AMBULATORY_CARE_PROVIDER_SITE_OTHER): Payer: No Typology Code available for payment source | Admitting: Orthopedic Surgery

## 2017-09-21 NOTE — Telephone Encounter (Signed)
Patient called advised the measurement for the brace is 25.5. The number to contact patient is [phone]

## 2017-09-21 NOTE — Telephone Encounter (Signed)
Sent information to [employer]

## 2017-09-21 NOTE — Telephone Encounter (Signed)
Patient wondering if brace is still supposed to come in tomorrow and also if you can give her an estimate on the cost. CB # [phone]

## 2017-09-21 NOTE — Telephone Encounter (Signed)
Emailed Ryan additional information needed to see about approval for patients brace

## 2017-09-21 NOTE — Telephone Encounter (Signed)
I have emailed Ryan to ask questions below per patient request. Waiting to be advised. IC patient and advised would let her know as soon as I knew something.

## 2017-09-21 NOTE — Telephone Encounter (Signed)
Talked with Carol Pugh and he needed measurement Patient will measure her leg per Carol Pugh she can give [name] a quad measurement 7" above her knee center. Talked with patient she will get measurement and let us know.

## 2017-09-22 ENCOUNTER — Telehealth: Payer: Self-pay | Admitting: *Deleted

## 2017-09-22 ENCOUNTER — Other Ambulatory Visit (INDEPENDENT_AMBULATORY_CARE_PROVIDER_SITE_OTHER): Payer: Self-pay | Admitting: Orthopedic Surgery

## 2017-09-22 ENCOUNTER — Telehealth (INDEPENDENT_AMBULATORY_CARE_PROVIDER_SITE_OTHER): Payer: Self-pay | Admitting: Orthopedic Surgery

## 2017-09-22 NOTE — Telephone Encounter (Signed)
I have sent email to Carol Pugh asking him to reach out to patient to give her an update on status of brace.

## 2017-09-22 NOTE — Telephone Encounter (Signed)
Copied from CRM [id number]. Topic: Quick Communication - See Telephone Encounter >> Sep 22, 2017  1:15 PM Laural Benes, [place] C wrote: CRM for notification. See Telephone encounter for: 09/22/17.  Pt called in and is requesting a referral to an ortho surgeon for a second opinion, pt says that she is okay with [facility]   CB: [phone]

## 2017-09-22 NOTE — Telephone Encounter (Signed)
Patient is still waiting to hear from [facility].  She is asking for his number. States she does not feel comfortable doing physical therapy without the brace.  States she is frustrated and would like to get an a response today.    cb  [phone]

## 2017-09-22 NOTE — Telephone Encounter (Signed)
Patient wondering if she can have # to contact [place]? Patients # [phone]

## 2017-09-22 NOTE — Telephone Encounter (Signed)
I called.

## 2017-09-22 NOTE — Telephone Encounter (Signed)
I have been diligently trying to work on this for patient and trying to get her a solution. I just received email from [facility] stating that patients insurance will not cover brace if they bill her because they are out of network. He is going to have one shipped by UPS to our office and should be delivered to [name] tomorrow. He also has been checking with all of his accounts in the [place] to see if they have one in stock that is the size/measurement that she needs and so since he was unable to locate one, he will have it sent to our office tomorrow and we will just bill patient through our office for the brace and that I will call her as soon as I have the brace in my hands. I have called patient and explained in detail. She did say that she was going to proceed with therapy appointment tomorrow even though she did not feel comfortable doing so and she felt she would not be able to do therapy.  Again apologized for the delay but that unfortunately these things take time. I did apologize again that she felt that she had been pushed off. I let her know that it was nor is it ever our intention to make our patients feel that way.  She proceeded to tell me that it was not just in regards to the brace but her care in general. She said she felt she had been uneducated in what was going on with her knee, did not understand why plan of care is for PT? She said she was never advised her ACL was ruptured that she had to read it on the MRI. She said she consulted with her grandfathers doctor who advised PT was not going to Carol Pugh anything. I suggested to patient if she was this "put out" with the care she had received she should have let me know much sooner since we had talked almost daily for the last several days and we could have certainly addressed her concerns much sooner. I did let her know that Dr August Saucer was in surgery this afternoon and would call her after he finished his case.

## 2017-09-23 ENCOUNTER — Telehealth (INDEPENDENT_AMBULATORY_CARE_PROVIDER_SITE_OTHER): Payer: Self-pay

## 2017-09-23 ENCOUNTER — Ambulatory Visit: Payer: No Typology Code available for payment source | Attending: Orthopedic Surgery | Admitting: Physical Therapy

## 2017-09-23 ENCOUNTER — Other Ambulatory Visit: Payer: Self-pay | Admitting: Family Medicine

## 2017-09-23 ENCOUNTER — Other Ambulatory Visit: Payer: Self-pay

## 2017-09-23 ENCOUNTER — Ambulatory Visit: Payer: No Typology Code available for payment source | Admitting: Physical Therapy

## 2017-09-23 ENCOUNTER — Encounter: Payer: Self-pay | Admitting: Physical Therapy

## 2017-09-23 DIAGNOSIS — R262 Difficulty in walking, not elsewhere classified: Secondary | ICD-10-CM | POA: Diagnosis present

## 2017-09-23 DIAGNOSIS — M25661 Stiffness of right knee, not elsewhere classified: Secondary | ICD-10-CM | POA: Insufficient documentation

## 2017-09-23 DIAGNOSIS — M25561 Pain in right knee: Secondary | ICD-10-CM | POA: Diagnosis present

## 2017-09-23 DIAGNOSIS — S83004A Unspecified dislocation of right patella, initial encounter: Secondary | ICD-10-CM

## 2017-09-23 DIAGNOSIS — M6281 Muscle weakness (generalized): Secondary | ICD-10-CM | POA: Insufficient documentation

## 2017-09-23 NOTE — Telephone Encounter (Signed)
Darl Pikes with [facility] called wanting to know if Dr. August Saucer had a specific protocol for therapy for patient.  Stated that they will being seeing patient today at 2:45pm.  Cb# is [phone].  Please advise.  Thank you

## 2017-09-23 NOTE — Telephone Encounter (Signed)
Brace actually received. Called patient to advise. She will come in to be fitted for brace.

## 2017-09-23 NOTE — Therapy (Addendum)
[facility] Outpatient Rehabilitation [facility] [address]  [address] [place], [place], [address] Phone: [phone]   Fax:  [phone]  Physical Therapy Evaluation  Patient Details  Name: Carol Pugh MRN: [id number] Date of Birth: Mar 12, 1995 Referring Provider: Cammy Copa, MD   Encounter Date: 09/23/2017   *all treatment was performed on 09/23/17*  PT End of Session - 09/23/17 1643    Visit Number  1    Number of Visits  13    Date for PT Re-Evaluation  10/21/17    Authorization Type  [facility]    PT Start Time  1443    PT Stop Time  1554    PT Time Calculation (min)  71 min    Activity Tolerance  Patient tolerated treatment well;Patient limited by pain    Behavior During Therapy  [employer] for tasks assessed/performed       Past Medical History:  Diagnosis Date  . Depression   . Distal renal tubular acidosis   . Family history of Marfan syndrome   . Headache   . History of kidney stones   . Hypertension   . Kidney stones   . Migraine     Past Surgical History:  Procedure Laterality Date  . EXTRACORPOREAL SHOCK WAVE LITHOTRIPSY Left 06/11/2017   Procedure: LEFT EXTRACORPOREAL SHOCK WAVE LITHOTRIPSY (ESWL);  Surgeon: Heloise Purpura, MD;  Location: [facility];  Service: Urology;  Laterality: Left;  . urinary stent placements      There were no vitals filed for this visit.   Subjective Assessment - 09/23/17 1445    Subjective  Patient reports on 09/05/17 R patella dislocated while she was dancing- leg buckled and it felt like it popped. Fell to the floor and then it had popped back in. Dislocated again and ER MD reduced patella under conscious sedation. Placed in immobilizer for 2.5 weeks. Today with new Donjoy brace to be worn at all times- also using 2 crutches for ambulation. Has another F/U with Dr. August Saucer next week. Unsure if she will have surgery or not. 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    Patient is accompained by:  Family member   grandmother   Pertinent History  distal renal tibular acidosis, family hx of Marfan syndrome, kidney stones, HTN, migraine    Limitations  Sitting;House hold activities;Lifting;Standing;Walking    How long can you sit comfortably?  unlimited if R LE propped on pillow    How long can you stand comfortably?  10 min    How long can you walk comfortably?  variable 5-15 min    Diagnostic tests  per patient MRI R knee 09/08/16: showed patellofemoral lig tear and MCL sprain and ACL rupture     Patient Stated Goals  be able to walk right; get back to work    Currently in Pain?  Yes    Pain Score  2     Pain Location  Knee    Pain Orientation  Right    Pain Descriptors / Indicators  Sharp;Shooting;Constant   pulling   Pain Type  Acute pain    Aggravating Factors   bending knee, putting weight through knee, pressure over patella and medial knee, rotating hip    Pain Relieving Factors  meds         [facility] PT Assessment - 09/23/17  1504      Assessment   Medical Diagnosis  Dislocation of R knee    Referring Provider  Cammy Copa, MD    Onset Date/Surgical Date  09/05/17    Next MD Visit  09/30/17    Prior Therapy  No      Precautions   Precautions  Knee    Precaution Comments  bledsoe brace on R knee      Restrictions   Weight Bearing Restrictions  No      Balance Screen   Has the patient fallen in the past 6 months  No    Has the patient had a decrease in activity level because of a fear of falling?   No    Is the patient reluctant to leave their home because of a fear of falling?   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    Leisure  hiking      Cognition   Overall Cognitive Status  Within Functional Limits for tasks assessed      Observation/Other Assessments   Focus on Therapeutic Outcomes (FOTO)   Knee: 32 (68% limited, 39% predicted)      Sensation   Light Touch  Appears Intact   notes numbess over R medial claf from previous accident     Coordination   Gross Motor Movements are Fluid and Coordinated  Yes      Posture/Postural Control   Posture/Postural Control  Postural limitations    Postural Limitations  Weight shift left      ROM / Strength   AROM / PROM / Strength  Strength;AROM;PROM      AROM   AROM Assessment Site  Knee    Right/Left Knee  Right;Left    Right Knee Extension  0    Right Knee Flexion  44   limited by pain   Left Knee Extension  0    Left Knee Flexion  132      PROM   PROM Assessment Site  Knee    Right/Left Knee  Right;Left    Right Knee Extension  0    Right Knee Flexion  47   limited by pain   Left Knee Extension  0    Left Knee Flexion  134      Strength   Strength Assessment Site  Hip;Knee;Ankle    Right/Left Hip  Left;Right    Right Hip Flexion  2/5    Right Hip ABduction  3/5    Right Hip ADduction  3/5    Left Hip Flexion  4+/5    Left Hip ABduction  4+/5    Left Hip ADduction  4+/5    Right/Left Knee  Left;Right    Right Knee Flexion  2/5    Right Knee Extension  2/5    Left Knee Flexion  4+/5    Left Knee Extension  4+/5    Right/Left Ankle  Right;Left    Right Ankle Plantar Flexion  4/5    Right Ankle Inversion  4/5    Left Ankle Dorsiflexion  4+/5    Left Ankle Plantar Flexion  4+/5      Flexibility   Soft Tissue Assessment /Muscle Length  yes    Hamstrings  midly tight B      Palpation   Patella mobility  R  knee good movement, avoiding lateral glide    Palpation comment  TTP and significant swelling over patella and medial knee      Ambulation/Gait   Assistive device  Crutches    Gait Pattern  Step-to pattern;Step-through pattern;Decreased hip/knee flexion - right;Decreased weight shift to right;Decreased stance time - right;Decreased step length - left                Objective measurements completed on examination: See above findings.      [facility] Adult PT Treatment/Exercise - 09/23/17 0001      Modalities   Modalities Vasopneumatic device to R knee (15 min, medium compression, coldest temp)            PT Education - 09/23/17 1642    Education Details  prognosis, POC, HEP    Person(s) Educated  Patient    Methods  Explanation;Demonstration;Tactile cues;Verbal cues;Handout    Comprehension  Returned demonstration;Verbalized understanding       PT Short Term Goals - 09/23/17 1813      PT SHORT TERM GOAL #1   Title  Patient independent with initial HEP.    Time  2    Period  Weeks    Status  New    Target Date  10/07/17        PT Long Term Goals - 09/23/17 1814      PT LONG TERM GOAL #1   Title  Patient to be independnet with advanced HEP.    Time  4    Period  Weeks    Status  New    Target Date  10/21/17      PT LONG TERM GOAL #2   Title  Patient to demonstrate R knee AROM/PROM 0-120 without pain limiting.    Time  4    Period  Weeks    Status  New    Target Date  10/21/17      PT LONG TERM GOAL #3   Title  Patient to demonstrate B LE strength >=4+/5.    Time  4    Period  Weeks    Status  New    Target Date  10/21/17      PT LONG TERM GOAL #4   Title  Patient to demonstrate equal step length, weight shift, and knee flexion throughout ambulation with LRAD.    Time  4    Period  Weeks    Status  New    Target Date  10/21/17             Plan - 09/23/17 1803    Clinical Impression Statement  Patient is a 22y/o F presenting to OPPT  with R knee pain after repeated patellar dislocations after initial event on 09/05/17. Pain is located centrally over patella as well as medial knee. Per- patient MRI showed MTFL tear, ACL tear, and MCL tear. MD and patient discussing surgery. Patient was placed in immobilizer, now with lateral patella stabilizing Bledsoe and 2 crutches for ambulation WBAT. Aggravating factors include R knee flexion,  WBing, laying supine. Patient today with significant swelling over and surrounding patella as well as medial knee. Limited R LE strength and patient very apprehensive and painful with R knee flexion. Educated on and administered gentle strengthening HEP handout with assistance to perform SLR as patient unable to perform herself. Patient reported understanding. Ended session with [employer] to R knee to reduce pain and edema. Normal integumentary response observed. Encouraged patient to continue icing knee at home. Would benefit from skilled PT services 3x/week for 4 weeks to address aforementioned impairments.     Clinical Presentation  Stable    Clinical Decision Making  Low    Rehab Potential  Good    PT Frequency  3x / week    PT Duration  4 weeks    PT Treatment/Interventions  ADLs/Self Care Home Management;Cryotherapy;Electrical Stimulation;Iontophoresis 4mg /ml Dexamethasone;Moist Heat;Ultrasound;Gait training;Stair training;Functional mobility training;DME Instruction;Therapeutic activities;Therapeutic exercise;Manual techniques;Patient/family education;Neuromuscular re-education;Balance training;Passive range of motion;Dry needling;Energy conservation;Scar mobilization;Splinting;Taping;Vasopneumatic Device    PT Next Visit Plan  reassess HEP    Consulted and Agree with Plan of Care  Patient       Patient will benefit from skilled therapeutic intervention in order to improve the following deficits and impairments:  Increased edema, Decreased activity tolerance, Decreased strength, Pain, Decreased  mobility, Difficulty walking, Decreased range of motion, Impaired flexibility, Hypermobility  Visit Diagnosis: Acute pain of right knee - Plan: PT plan of care cert/re-cert  Stiffness of right knee, not elsewhere classified - Plan: PT plan of care cert/re-cert  Muscle weakness (generalized) - Plan: PT plan of care cert/re-cert  Difficulty in walking, not elsewhere classified - Plan: PT plan of care cert/re-cert     Problem List Patient Active Problem List   Diagnosis Date Noted  . Essential hypertension 06/09/2017  . History of kidney stones 04/08/2017  . Family history of Marfan syndrome 04/08/2017  . Migraine 04/08/2017    Anette Guarneri, PT, DPT 09/24/17 7:47 AM  [facility] [address]  [address] [place], [place], [address] Phone: [phone]   Fax:  [phone]  Name: MEGHANA TULLO MRN: [id number] Date of Birth: 1995/04/10

## 2017-09-23 NOTE — Telephone Encounter (Signed)
Brace is at my desk. Patient will need to sign form stating received it. She will be here some time today to have brace applied.

## 2017-09-23 NOTE — Telephone Encounter (Signed)
Referral done

## 2017-09-23 NOTE — Telephone Encounter (Signed)
I replied to her in basket message that she had sent advising that no specific protocol, only needed to work on SLR and bending knee.

## 2017-09-23 NOTE — Telephone Encounter (Signed)
OK to place referral to orthoEulah Pont and W; dx dislocation of R patella. TY.

## 2017-09-28 ENCOUNTER — Ambulatory Visit: Payer: No Typology Code available for payment source

## 2017-09-28 MED FILL — CLOMIPHENE CITRATE 50 MG TA: 50 | 5 days supply | Qty: 5 | Fill #1

## 2017-09-28 MED FILL — PROPRANOLOL ER 60 MG CAP: 60 | 30 days supply | Qty: 30 | Fill #4

## 2017-09-30 ENCOUNTER — Ambulatory Visit: Payer: No Typology Code available for payment source | Admitting: Physical Therapy

## 2017-09-30 ENCOUNTER — Encounter: Payer: Self-pay | Admitting: Physical Therapy

## 2017-09-30 ENCOUNTER — Ambulatory Visit (INDEPENDENT_AMBULATORY_CARE_PROVIDER_SITE_OTHER): Payer: No Typology Code available for payment source | Admitting: Orthopedic Surgery

## 2017-09-30 DIAGNOSIS — M25661 Stiffness of right knee, not elsewhere classified: Secondary | ICD-10-CM

## 2017-09-30 DIAGNOSIS — R262 Difficulty in walking, not elsewhere classified: Secondary | ICD-10-CM

## 2017-09-30 DIAGNOSIS — M25561 Pain in right knee: Secondary | ICD-10-CM | POA: Diagnosis not present

## 2017-09-30 DIAGNOSIS — M6281 Muscle weakness (generalized): Secondary | ICD-10-CM

## 2017-09-30 NOTE — Therapy (Signed)
[facility] Outpatient Rehabilitation [facility] [address]  [address] [place], [place], [address] Phone: [phone]   Fax:  [phone]  Physical Therapy Treatment  Patient Details  Name: Carol Pugh MRN: [id number] Date of Birth: 07-07-1995 Referring Provider: Cammy Copa, MD   Encounter Date: 09/30/2017  PT End of Session - 09/30/17 1539    Visit Number  2    Number of Visits  13    Date for PT Re-Evaluation  10/21/17    Authorization Type  [facility]    PT Start Time  1531    PT Stop Time  1630    PT Time Calculation (min)  59 min    Activity Tolerance  Patient tolerated treatment well    Behavior During Therapy  [facility] for tasks assessed/performed       Past Medical History:  Diagnosis Date  . Depression   . Distal renal tubular acidosis   . Family history of Marfan syndrome   . Headache   . History of kidney stones   . Hypertension   . Kidney stones   . Migraine     Past Surgical History:  Procedure Laterality Date  . EXTRACORPOREAL SHOCK WAVE LITHOTRIPSY Left 06/11/2017   Procedure: LEFT EXTRACORPOREAL SHOCK WAVE LITHOTRIPSY (ESWL);  Surgeon: Heloise Purpura, MD;  Location: [facility];  Service: Urology;  Laterality: Left;  . urinary stent placements      There were no vitals filed for this visit.  Subjective Assessment - 09/30/17 1542    Subjective  Pt reports the swelling in her Rt knee is much better than before.  She has questions of how to safely enter/exit showertub.  She has been doing exercises 2x/day. She doesn't trust her knee when walking; always feels like its going to buckle.   She is scheduled for surgery 10/20/17.     Patient Stated Goals  be able to walk right; get back to work    Currently in Pain?  Yes    Pain Score  1     Pain Location  Knee    Pain Orientation  Right    Pain Descriptors / Indicators  Sore    Aggravating Factors   bending knee     Pain Relieving Factors  not moving knee         [facility] PT Assessment  - 09/30/17 0001      Assessment   Medical Diagnosis  Dislocation of R knee    Referring Provider  Cammy Copa, MD    Onset Date/Surgical Date  09/05/17    Next MD Visit  after surgery      AROM   Right/Left Knee  Right    Right Knee Extension  0    Right Knee Flexion  73   AAROM heel slide       [facility] Adult PT Treatment/Exercise - 09/30/17 0001      Exercises   Exercises  Ankle;Knee/Hip      Knee/Hip Exercises: Stretches   Passive Hamstring Stretch  Right;30 seconds;3 reps      Knee/Hip Exercises: Standing   Gait Training  100 ft with bilat axillary crutches, step through pattern.  VC to decrease Rt step length, increase heel strike, toe off and allow knee to bend during swing through.  VC for improved posture (not to lean armpits into crutches) -- improved quality observed with increased distance.       Knee/Hip Exercises: Seated   Sit to [employer]  5 reps;with UE support   leaning Lt      Knee/Hip Exercises: Supine   Quad Sets  Strengthening;Right;1 set;5 reps   10 sec holds   Heel Slides  Right;1 set;10 reps;AAROM   assist with strap   Straight Leg Raises  Strengthening;Right;1 set;10 reps    Straight Leg Raise with External Rotation  Strengthening;Right;1 set;5 reps      Knee/Hip Exercises: Sidelying   Hip ABduction  Strengthening;Right;1 set;10 reps    Hip ADduction  Strengthening;Right;1 set;10 reps      Knee/Hip Exercises: Prone   Hip Extension  Strengthening;Right;1 set;10 reps      Ankle Exercises: Stretches   Gastroc Stretch  2 reps;30 seconds   seated with strap     Ankle Exercises: Seated   ABC's  1 rep             PT Education - 09/30/17 1627    Education Details  HEP     Person(s) Educated  Patient    Methods  Explanation;Demonstration;Tactile cues;Verbal cues;Handout    Comprehension  Verbalized understanding;Returned demonstration       PT Short Term Goals - 09/23/17 1813      PT SHORT TERM GOAL #1   Title  Patient independent  with initial HEP.    Time  2    Period  Weeks    Status  New    Target Date  10/07/17        PT Long Term Goals - 09/23/17 1814      PT LONG TERM GOAL #1   Title  Patient to be independnet with advanced HEP.    Time  4    Period  Weeks    Status  New    Target Date  10/21/17      PT LONG TERM GOAL #2   Title  Patient to demonstrate R knee AROM/PROM 0-120 without pain limiting.    Time  4    Period  Weeks    Status  New    Target Date  10/21/17      PT LONG TERM GOAL #3   Title  Patient to demonstrate B LE strength >=4+/5.    Time  4    Period  Weeks    Status  New    Target Date  10/21/17      PT LONG TERM GOAL #4   Title  Patient to demonstrate equal step length, weight shift, and knee flexion throughout ambulation with LRAD.    Time  4    Period  Weeks    Status  New    Target Date  10/21/17            Plan - 09/30/17 1620    Clinical Impression Statement  Pt demonstrated improved Rt knee flexion ROM to 73 deg.  Pt tolerated all exercises well, with mild increase in discomfort with quad contractions.  Pt very guarded with position changes.  Progressing well towards established therapy goals.     Rehab Potential  Good    PT Frequency  3x / week    PT Duration  4 weeks    PT Treatment/Interventions  ADLs/Self Care Home Management;Cryotherapy;Electrical Stimulation;Iontophoresis 4mg /ml Dexamethasone;Moist Heat;Ultrasound;Gait training;Stair training;Functional mobility training;DME Instruction;Therapeutic activities;Therapeutic exercise;Manual techniques;Patient/family education;Neuromuscular re-education;Balance training;Passive range of motion;Dry needling;Energy conservation;Scar mobilization;Splinting;Taping;Vasopneumatic Device    PT Next Visit Plan  continue progressive Rt knee ROM, LE strengthening and gait. Ktape trial for edema reduction    Consulted and Agree with  Plan of Care  Patient       Patient will benefit from skilled therapeutic intervention  in order to improve the following deficits and impairments:  Increased edema, Decreased activity tolerance, Decreased strength, Pain, Decreased mobility, Difficulty walking, Decreased range of motion, Impaired flexibility, Hypermobility  Visit Diagnosis: Acute pain of right knee  Stiffness of right knee, not elsewhere classified  Muscle weakness (generalized)  Difficulty in walking, not elsewhere classified     Problem List Patient Active Problem List   Diagnosis Date Noted  . Essential hypertension 06/09/2017  . History of kidney stones 04/08/2017  . Family history of Marfan syndrome 04/08/2017  . Migraine 04/08/2017   Mayer Camel, PTA 09/30/17 4:32 PM  [facility] [address]  [address] [place], [place], [address] Phone: [phone]   Fax:  [phone]  Name: Carol Pugh MRN: [id number] Date of Birth: [birth year]

## 2017-09-30 NOTE — Patient Instructions (Signed)
Straight Leg Raise   Tighten stomach and slowly raise locked right leg __6-8__ inches from floor. Repeat __10__ times per set. Do _2___ sets per session. Do ___1_ sessions per day.  Hip Extension (Prone)   Lift left leg _2_ inches from floor, keeping knee locked. Repeat __10__ times per set. Do ____ sets per session. Do _1___ sessions per day.  Strengthening: Hip Abduction (Side2-Lying)   Tighten muscles on front of left thigh, then lift leg _6-8___ inches from surface, keeping knee locked.  Repeat _10___ times per set. Do _2___ sets per session. Do __1__ sessions per day.   Strengthening: Hip Adduction (Side-Lying)  Outer Hip Stretch: Reclined IT Band Stretch (Strap) Tighten muscles on front of right thigh, then lift leg ___3_ inches from surface, keeping knee locked.  Repeat _10__ times per set. Do _2___ sets per session. Do _1___ sessions per day.   Self-Mobilization: Heel Slide (Supine)    Slide left heel toward buttocks until a gentle stretch is felt. Hold _10___ seconds. Relax. Repeat _10___ times per set. Do _1___ sets per session. Do __2__ sessions per day. Use a strap to assist with the bend.   Calf Stretch    With towel around forefoot, keep knee straight and pull back on towel until a stretch is felt in the calf. Hold _30___ seconds. Repeat __2__ times. Do __2__ sessions per day. Ankle Alphabet    Using left ankle and foot only, trace the letters of the alphabet. Perform A to Z. Repeat _1___ times per set. Do __1__ sets per session. Do _2_ sessions per day.   [facility] [address] [place], [place] [address]  [phone] (office) [phone] (fax)

## 2017-10-07 ENCOUNTER — Ambulatory Visit: Payer: No Typology Code available for payment source | Admitting: Physical Therapy

## 2017-10-07 ENCOUNTER — Encounter: Payer: Self-pay | Admitting: Physical Therapy

## 2017-10-07 DIAGNOSIS — M25561 Pain in right knee: Secondary | ICD-10-CM

## 2017-10-07 DIAGNOSIS — M6281 Muscle weakness (generalized): Secondary | ICD-10-CM

## 2017-10-07 DIAGNOSIS — M25661 Stiffness of right knee, not elsewhere classified: Secondary | ICD-10-CM

## 2017-10-07 DIAGNOSIS — R262 Difficulty in walking, not elsewhere classified: Secondary | ICD-10-CM

## 2017-10-07 NOTE — Therapy (Signed)
[facility] [address]  [address] [place], [place], [address] Phone: [phone]   Fax:  [phone]  Physical Therapy Treatment  Patient Details  Name: Carol Pugh MRN: [id number] Date of Birth: September 01, 1995 Referring Provider: Cammy Copa, MD   Encounter Date: 10/07/2017  PT End of Session - 10/07/17 1054    Visit Number  3    Number of Visits  13    Date for PT Re-Evaluation  10/21/17    Authorization Type  [facility]    PT Start Time  1015    PT Stop Time  1101    PT Time Calculation (min)  46 min    Activity Tolerance  Patient tolerated treatment well;Patient limited by pain    Behavior During Therapy  [facility] for tasks assessed/performed       Past Medical History:  Diagnosis Date  . Depression   . Distal renal tubular acidosis   . Family history of Marfan syndrome   . Headache   . History of kidney stones   . Hypertension   . Kidney stones   . Migraine     Past Surgical History:  Procedure Laterality Date  . EXTRACORPOREAL SHOCK WAVE LITHOTRIPSY Left 06/11/2017   Procedure: LEFT EXTRACORPOREAL SHOCK WAVE LITHOTRIPSY (ESWL);  Surgeon: Heloise Purpura, MD;  Location: [facility];  Service: Urology;  Laterality: Left;  . urinary stent placements      There were no vitals filed for this visit.  Subjective Assessment - 10/07/17 1019    Subjective  Reports R knee is feeling much better, however still not able to fully flex knee.     Pertinent History  distal renal tibular acidosis, family hx of Marfan syndrome, kidney stones, HTN, migraine    Diagnostic tests  per patient MRI R knee 09/08/16: showed patellofemoral lig tear and MCL sprain and ACL rupture     Patient Stated Goals  be able to walk right; get back to work    Currently in Pain?  No/denies                       [facility] Adult PT Treatment/Exercise - 10/07/17 0001      Exercises   Exercises  Ankle;Knee/Hip      Knee/Hip Exercises:  Stretches   Passive Hamstring Stretch  Right;30 seconds;2 reps    Passive Hamstring Stretch Limitations  supine strap    Hip Flexor Stretch  Right;2 reps;30 seconds;Limitations    Hip Flexor Stretch Limitations  mod thomas strap      Knee/Hip Exercises: Aerobic   Nustep  L1 x 6 min within pain-less ROM   c/o mild R knee pain and clicking with full extension     Knee/Hip Exercises: Standing   Other Standing Knee Exercises  STS with bottom touch on 2 foam pads x10   unable to tolerate with 1 foam pad     Knee/Hip Exercises: Supine   Quad Sets  Strengthening;Right;1 set;10 reps;Limitations    Quad Sets Limitations  10x10" with towel roll under knee    Bridges  Strengthening;Both;10 reps;2 sets    Bridges Limitations  manual assistance to maintain R knee in flexion for set 1    Straight Leg Raises  Strengthening;Right;1 set;10 reps    Straight Leg Raises Limitations  cues for limited range    Straight Leg Raise with External Rotation  Strengthening;Right;1 set;10 reps      Knee/Hip Exercises: Sidelying  Hip ABduction  Strengthening;Right;1 set;10 reps;Left    Hip ABduction Limitations  cues for alignment    Hip ADduction  Strengthening;Right;1 set;10 reps;Left    Hip ADduction Limitations  cues for form      Knee/Hip Exercises: Prone   Hip Extension  Strengthening;Right;1 set;10 reps;Limitations    Hip Extension Limitations  prone donkey kicks               PT Short Term Goals - 10/07/17 1058      PT SHORT TERM GOAL #1   Title  Patient independent with initial HEP.    Time  2    Period  Weeks    Status  Achieved        PT Long Term Goals - 10/07/17 1058      PT LONG TERM GOAL #1   Title  Patient to be independnet with advanced HEP.    Time  4    Period  Weeks    Status  On-going      PT LONG TERM GOAL #2   Title  Patient to demonstrate R knee AROM/PROM 0-120 without pain limiting.    Time  4    Period  Weeks    Status  On-going      PT LONG TERM GOAL  #3   Title  Patient to demonstrate B LE strength >=4+/5.    Time  4    Period  Weeks    Status  On-going      PT LONG TERM GOAL #4   Title  Patient to demonstrate equal step length, weight shift, and knee flexion throughout ambulation with LRAD.    Time  4    Period  Weeks    Status  On-going            Plan - 10/07/17 1055    Clinical Impression Statement  Patient arrived to session using B crutches and with patellar knee brace. Patient reporting improved edema in R knee. Patient with good tolerance of LE stretching and progressive quad and hip strengthening. Better able to flex R knee during ther-ex today. Progressed STS transfers with mini squats with 2 foam pads under bottom- patient unable to tolerate with just 1 foam pad d/t pain. Requested Gameready to R knee at end of session for continued pain relief with good response.    PT Treatment/Interventions  ADLs/Self Care Home Management;Cryotherapy;Electrical Stimulation;Iontophoresis 4mg /ml Dexamethasone;Moist Heat;Ultrasound;Gait training;Stair training;Functional mobility training;DME Instruction;Therapeutic activities;Therapeutic exercise;Manual techniques;Patient/family education;Neuromuscular re-education;Balance training;Passive range of motion;Dry needling;Energy conservation;Scar mobilization;Splinting;Taping;Vasopneumatic Device    Consulted and Agree with Plan of Care  Patient       Patient will benefit from skilled therapeutic intervention in order to improve the following deficits and impairments:  Increased edema, Decreased activity tolerance, Decreased strength, Pain, Decreased mobility, Difficulty walking, Decreased range of motion, Impaired flexibility, Hypermobility  Visit Diagnosis: Acute pain of right knee  Stiffness of right knee, not elsewhere classified  Muscle weakness (generalized)  Difficulty in walking, not elsewhere classified     Problem List Patient Active Problem List   Diagnosis Date Noted   . Essential hypertension 06/09/2017  . History of kidney stones 04/08/2017  . Family history of Marfan syndrome 04/08/2017  . Migraine 04/08/2017    Anette Guarneri, PT, DPT 10/07/17 11:47 AM   [facility] [address]  [address] [place], [place], [address] Phone: [phone]   Fax:  [phone]  Name: ABYGALE KARPF MRN: [id number]  Date of Birth: 1995-02-27

## 2017-10-09 ENCOUNTER — Ambulatory Visit: Payer: No Typology Code available for payment source

## 2017-10-09 ENCOUNTER — Ambulatory Visit (INDEPENDENT_AMBULATORY_CARE_PROVIDER_SITE_OTHER): Payer: No Typology Code available for payment source | Admitting: Cardiology

## 2017-10-09 ENCOUNTER — Encounter: Payer: Self-pay | Admitting: Cardiology

## 2017-10-09 VITALS — BP 122/76 | HR 84 | Ht 69.0 in | Wt 259.0 lb

## 2017-10-09 DIAGNOSIS — M6281 Muscle weakness (generalized): Secondary | ICD-10-CM

## 2017-10-09 DIAGNOSIS — Z01810 Encounter for preprocedural cardiovascular examination: Secondary | ICD-10-CM | POA: Diagnosis not present

## 2017-10-09 DIAGNOSIS — R262 Difficulty in walking, not elsewhere classified: Secondary | ICD-10-CM

## 2017-10-09 DIAGNOSIS — Z8279 Family history of other congenital malformations, deformations and chromosomal abnormalities: Secondary | ICD-10-CM

## 2017-10-09 DIAGNOSIS — M25661 Stiffness of right knee, not elsewhere classified: Secondary | ICD-10-CM

## 2017-10-09 DIAGNOSIS — N209 Urinary calculus, unspecified: Secondary | ICD-10-CM | POA: Insufficient documentation

## 2017-10-09 DIAGNOSIS — M25561 Pain in right knee: Secondary | ICD-10-CM

## 2017-10-09 DIAGNOSIS — N200 Calculus of kidney: Secondary | ICD-10-CM | POA: Insufficient documentation

## 2017-10-09 DIAGNOSIS — I10 Essential (primary) hypertension: Secondary | ICD-10-CM

## 2017-10-09 NOTE — Progress Notes (Signed)
Cardiology Office Note:    Date:  10/09/2017   ID:  Carol Pugh, DOB 03-23-1995, MRN [id number]  PCP:  Sharlene Dory, DO  Cardiologist:  Garwin Brothers, MD   Referring MD: Sharlene Dory*    ASSESSMENT:    1. Preoperative cardiovascular examination   2. Essential hypertension   3. Family history of Marfan syndrome    PLAN:    In order of problems listed above:  1. Primary prevention stressed to the patient.  Importance of compliance with diet and medication stressed and she vocalized understanding.  Her blood pressure is stable.  In view of the previous evaluation done recently which has been unremarkable, she is not at high risk for coronary events during the aforementioned surgery.  Meticulous hemodynamic monitoring and continued beta-blockade will further reduce the risk of coronary events.    Medication Adjustments/Labs and Tests Ordered: Current medicines are reviewed at length with the patient today.  Concerns regarding medicines are outlined above.  No orders of the defined types were placed in this encounter.  No orders of the defined types were placed in this encounter.    No chief complaint on file.    History of Present Illness:    Carol Pugh is a 22 y.o. female.  Patient has history of essential hypertension.  She denies any problems at this time.  She was very active until she fell and had injury to her right knee.  She is going to need surgery for this and is here for preop assessment.  No chest pain orthopnea PND or palpitations.  Her blood pressure and heart rate are well controlled with the beta-blocker that she is on.  She has been on it for very long time.  Past Medical History:  Diagnosis Date  . Depression   . Distal renal tubular acidosis   . Family history of Marfan syndrome   . Headache   . History of kidney stones   . Hypertension   . Kidney stones   . Migraine     Past Surgical History:  Procedure  Laterality Date  . EXTRACORPOREAL SHOCK WAVE LITHOTRIPSY Left 06/11/2017   Procedure: LEFT EXTRACORPOREAL SHOCK WAVE LITHOTRIPSY (ESWL);  Surgeon: Heloise Purpura, MD;  Location: [facility];  Service: Urology;  Laterality: Left;  . urinary stent placements      Current Medications: Current Meds  Medication Sig  . buPROPion (WELLBUTRIN XL) 150 MG 24 hr tablet Take 1 tablet (150 mg total) by mouth daily.  . clomiPHENE (CLOMID) 50 MG tablet   . EPINEPHrine 0.3 mg/0.3 mL IJ SOAJ injection Inject into the muscle once.  . medroxyPROGESTERone (PROVERA) 10 MG tablet   . propranolol ER (INDERAL LA) 60 MG 24 hr capsule Take 1 capsule (60 mg total) by mouth daily.  . SUMAtriptan (IMITREX) 100 MG tablet Take 1 tablet (100 mg total) by mouth every 2 (two) hours as needed for migraine. May repeat in 2 hours if headache persists or recurs.     Allergies:   Patient has no known allergies.   Social History   Socioeconomic History  . Marital status: Married    Spouse name: Not on file  . Number of children: Not on file  . Years of education: Not on file  . Highest education level: Not on file  Occupational History  . Not on file  Social Needs  . Financial resource strain: Not on file  . Food insecurity:    Worry: Not on file  Inability: Not on file  . Transportation needs:    Medical: Not on file    Non-medical: Not on file  Tobacco Use  . Smoking status: Current Every Day Smoker    Packs/day: 0.50    Years: 5.00    Pack years: 2.50    Types: Cigarettes  . Smokeless tobacco: Never Used  Substance and Sexual Activity  . Alcohol use: Not Currently    Frequency: Never  . Drug use: Never  . Sexual activity: Not on file  Lifestyle  . Physical activity:    Days per week: Not on file    Minutes per session: Not on file  . Stress: Not on file  Relationships  . Social connections:    Talks on phone: Not on file    Gets together: Not on file    Attends religious service: Not on file     Active member of club or organization: Not on file    Attends meetings of clubs or organizations: Not on file    Relationship status: Not on file  Other Topics Concern  . Not on file  Social History Narrative  . Not on file     Family History: The patient's family history includes Marfan syndrome in her father.  ROS:   Please see the history of present illness.    All other systems reviewed and are negative.  EKGs/Labs/Other Studies Reviewed:    The following studies were reviewed today: I discussed my findings with the patient at length.  EKG reveals sinus rhythm and nonspecific ST changes.  Echocardiogram and Holter monitor report from last evaluation was discussed with the patient at length.   Recent Labs: No results found for requested labs within last 8760 hours.  Recent Lipid Panel No results found for: CHOL, TRIG, HDL, CHOLHDL, VLDL, LDLCALC, LDLDIRECT  Physical Exam:    VS:  BP 122/76 (BP Location: Right Arm, Patient Position: Sitting, Cuff Size: Normal)   Pulse 84   Ht 5\' 9"  (1.753 m)   Wt 259 lb (117.5 kg)   SpO2 98%   BMI 38.25 kg/m     Wt Readings from Last 3 Encounters:  10/09/17 259 lb (117.5 kg)  09/10/17 250 lb (113.4 kg)  09/07/17 256 lb 2 oz (116.2 kg)     GEN: Patient is in no acute distress HEENT: Normal NECK: No JVD; No carotid bruits LYMPHATICS: No lymphadenopathy CARDIAC: Hear sounds regular, 2/6 systolic murmur at the apex. RESPIRATORY:  Clear to auscultation without rales, wheezing or rhonchi  ABDOMEN: Soft, non-tender, non-distended MUSCULOSKELETAL:  No edema; No deformity  SKIN: Warm and dry NEUROLOGIC:  Alert and oriented x 3 PSYCHIATRIC:  Normal affect   Signed, Garwin Brothers, MD  10/09/2017 1:46 PM    [facility]

## 2017-10-09 NOTE — Therapy (Signed)
[facility] [address]  [address] [place], [place], [address] Phone: [phone]   Fax:  [phone]  Physical Therapy Treatment  Patient Details  Name: Carol Pugh MRN: [id number] Date of Birth: [birth year] Referring Provider (PT): Cammy Copa, MD   Encounter Date: 10/09/2017  PT End of Session - 10/09/17 0848    Visit Number  4    Number of Visits  13    Date for PT Re-Evaluation  10/21/17    Authorization Type  [facility]    PT Start Time  0845    PT Stop Time  0935    PT Time Calculation (min)  50 min    Activity Tolerance  Patient tolerated treatment well;Patient limited by pain    Behavior During Therapy  [facility] for tasks assessed/performed       Past Medical History:  Diagnosis Date  . Depression   . Distal renal tubular acidosis   . Family history of Marfan syndrome   . Headache   . History of kidney stones   . Hypertension   . Kidney stones   . Migraine     Past Surgical History:  Procedure Laterality Date  . EXTRACORPOREAL SHOCK WAVE LITHOTRIPSY Left 06/11/2017   Procedure: LEFT EXTRACORPOREAL SHOCK WAVE LITHOTRIPSY (ESWL);  Surgeon: Heloise Purpura, MD;  Location: [facility];  Service: Urology;  Laterality: Left;  . urinary stent placements      There were no vitals filed for this visit.  Subjective Assessment - 10/09/17 0852    Subjective  Pt. doing well today with no new complaints.      Pertinent History  distal renal tibular acidosis, family hx of Marfan syndrome, kidney stones, HTN, migraine    Diagnostic tests  per patient MRI R knee 09/08/16: showed patellofemoral lig tear and MCL sprain and ACL rupture     Patient Stated Goals  be able to walk right; get back to work    Currently in Pain?  No/denies    Pain Score  0-No pain    Multiple Pain Sites  No         [facility] PT Assessment - 10/09/17 0906      AROM   AROM Assessment Site  Knee    Right/Left Knee  Right    Right Knee Extension   0    Right Knee Flexion  84   knee brace on      PROM   PROM Assessment Site  Knee    Right/Left Knee  Right    Right Knee Extension  0    Right Knee Flexion  95   knee brace                   [facility] Adult PT Treatment/Exercise - 10/09/17 0858      Knee/Hip Exercises: Aerobic   Recumbent Bike  half rotations for ROM       Knee/Hip Exercises: Standing   Terminal Knee Extension  Right;10 reps    Terminal Knee Extension Limitations  ball squeeze on wall     Other Standing Knee Exercises  STS with bottom touch on 2 foam pads x12   Mirror feedback for proper wt. distribution      Knee/Hip Exercises: Supine   Straight Leg Raises  Right;2 sets;10 reps    Straight Leg Raises Limitations  1#    Knee Flexion  Right;15 reps    Knee Flexion Limitations  no assistance  Knee/Hip Exercises: Sidelying   Hip ABduction  Right;10 reps;2 sets;Strengthening    Hip ABduction Limitations  1#    Cues required for slight hip extension    Hip ADduction  Right;10 reps;2 sets;Strengthening    Hip ADduction Limitations  1#      Knee/Hip Exercises: Prone   Hip Extension  Strengthening;Right;10 reps;Limitations;2 sets    Hip Extension Limitations  1#; straight leg raise      Vasopneumatic   Number Minutes Vasopneumatic   10 minutes    Vasopnuematic Location   Knee   R   Vasopneumatic Pressure  Medium    Vasopneumatic Temperature   Coldest temp               PT Short Term Goals - 10/07/17 1058      PT SHORT TERM GOAL #1   Title  Patient independent with initial HEP.    Time  2    Period  Weeks    Status  Achieved        PT Long Term Goals - 10/07/17 1058      PT LONG TERM GOAL #1   Title  Patient to be independnet with advanced HEP.    Time  4    Period  Weeks    Status  On-going      PT LONG TERM GOAL #2   Title  Patient to demonstrate R knee AROM/PROM 0-120 without pain limiting.    Time  4    Period  Weeks    Status  On-going      PT LONG TERM GOAL  #3   Title  Patient to demonstrate B LE strength >=4+/5.    Time  4    Period  Weeks    Status  On-going      PT LONG TERM GOAL #4   Title  Patient to demonstrate equal step length, weight shift, and knee flexion throughout ambulation with LRAD.    Time  4    Period  Weeks    Status  On-going            Plan - 10/09/17 0851    Clinical Impression Statement  Carol Pugh reporting no issues with current HEP.  Tolerated progression of proximal hip strengthening and addition of ball TKE on wall well today.  Did have some R knee pain with STS transfer however tolerable.  Ended visit with ice/compression to R knee to decrease post-exercise soreness and swelling.       PT Treatment/Interventions  ADLs/Self Care Home Management;Cryotherapy;Electrical Stimulation;Iontophoresis 4mg /ml Dexamethasone;Moist Heat;Ultrasound;Gait training;Stair training;Functional mobility training;DME Instruction;Therapeutic activities;Therapeutic exercise;Manual techniques;Patient/family education;Neuromuscular re-education;Balance training;Passive range of motion;Dry needling;Energy conservation;Scar mobilization;Splinting;Taping;Vasopneumatic Device    Consulted and Agree with Plan of Care  Patient       Patient will benefit from skilled therapeutic intervention in order to improve the following deficits and impairments:  Increased edema, Decreased activity tolerance, Decreased strength, Pain, Decreased mobility, Difficulty walking, Decreased range of motion, Impaired flexibility, Hypermobility  Visit Diagnosis: Acute pain of right knee  Stiffness of right knee, not elsewhere classified  Muscle weakness (generalized)  Difficulty in walking, not elsewhere classified     Problem List Patient Active Problem List   Diagnosis Date Noted  . Essential hypertension 06/09/2017  . History of kidney stones 04/08/2017  . Family history of Marfan syndrome 04/08/2017  . Migraine 04/08/2017    Kermit Balo,  PTA 10/09/17 9:35 AM   [facility] [address]  [place] [address]  [address] [place], [place], [address] Phone: [phone]   Fax:  [phone]  Name: Carol Pugh MRN: [id number] Date of Birth: 1995-10-26

## 2017-10-09 NOTE — Patient Instructions (Signed)
Medication Instructions:  Your physician recommends that you continue on your current medications as directed. Please refer to the Current Medication list given to you today.  Labwork: None  Testing/Procedures: None  Follow-Up: Your physician recommends that you schedule a follow-up appointment in: 9 months  Any Other Special Instructions Will Be Listed Below (If Applicable).     If you need a refill on your cardiac medications before your next appointment, please call your pharmacy.   [facility]  Vannary Greening A, RN, BSN  

## 2017-10-12 ENCOUNTER — Telehealth: Payer: Self-pay | Admitting: Cardiology

## 2017-10-12 ENCOUNTER — Encounter (HOSPITAL_BASED_OUTPATIENT_CLINIC_OR_DEPARTMENT_OTHER): Payer: Self-pay | Admitting: *Deleted

## 2017-10-12 ENCOUNTER — Other Ambulatory Visit: Payer: Self-pay

## 2017-10-12 NOTE — Telephone Encounter (Signed)
Pt called and stated her surgeon need her pre op clearance faxed to them

## 2017-10-13 NOTE — Telephone Encounter (Signed)
Clearance for surgery has been faxed  °

## 2017-10-14 ENCOUNTER — Ambulatory Visit: Payer: No Typology Code available for payment source | Attending: Internal Medicine | Admitting: Physical Therapy

## 2017-10-14 ENCOUNTER — Encounter: Payer: Self-pay | Admitting: Physical Therapy

## 2017-10-14 DIAGNOSIS — M6281 Muscle weakness (generalized): Secondary | ICD-10-CM | POA: Insufficient documentation

## 2017-10-14 DIAGNOSIS — M25561 Pain in right knee: Secondary | ICD-10-CM | POA: Diagnosis not present

## 2017-10-14 DIAGNOSIS — R262 Difficulty in walking, not elsewhere classified: Secondary | ICD-10-CM | POA: Insufficient documentation

## 2017-10-14 DIAGNOSIS — M25661 Stiffness of right knee, not elsewhere classified: Secondary | ICD-10-CM | POA: Insufficient documentation

## 2017-10-14 NOTE — Therapy (Signed)
[facility] [address]  [address] [place], [place], [address] Phone: [phone]   Fax:  [phone]  Physical Therapy Treatment  Patient Details  Name: Carol Pugh MRN: [id number] Date of Birth: 1995/10/19 Referring Provider (PT): Cammy Copa, MD   Encounter Date: 10/14/2017  PT End of Session - 10/14/17 1206    Visit Number  5    Number of Visits  13    Date for PT Re-Evaluation  10/21/17    Authorization Type  [facility]    PT Start Time  1014    PT Stop Time  1103    PT Time Calculation (min)  49 min    Activity Tolerance  Patient tolerated treatment well    Behavior During Therapy  [facility] for tasks assessed/performed       Past Medical History:  Diagnosis Date  . Anxiety   . Depression   . Distal renal tubular acidosis   . Family history of Marfan syndrome   . Headache   . Heart murmur   . History of kidney stones   . Hypertension   . Kidney stones   . Migraine   . PCOS (polycystic ovarian syndrome)     Past Surgical History:  Procedure Laterality Date  . EXTRACORPOREAL SHOCK WAVE LITHOTRIPSY Left 06/11/2017   Procedure: LEFT EXTRACORPOREAL SHOCK WAVE LITHOTRIPSY (ESWL);  Surgeon: Heloise Purpura, MD;  Location: [facility];  Service: Urology;  Laterality: Left;  . urinary stent placements      There were no vitals filed for this visit.  Subjective Assessment - 10/14/17 1014    Subjective  Reports she had a lot of pain since last session and having more painful popping in R knee with end range knee flexion. Reports that "it feels like it is not tracking right." Hurts more to do exercises with brace off.     Pertinent History  distal renal tibular acidosis, family hx of Marfan syndrome, kidney stones, HTN, migraine    How long can you sit comfortably?  unlimited if R LE propped on pillow    Diagnostic tests  per patient MRI R knee 09/08/16: showed patellofemoral lig tear and MCL sprain and ACL rupture     Patient Stated Goals  be able to walk right; get back to work    Currently in Pain?  No/denies                       [facility] Adult PT Treatment/Exercise - 10/14/17 0001      Exercises   Exercises  Knee/Hip      Knee/Hip Exercises: Stretches   Passive Hamstring Stretch  Right;30 seconds;1 rep;Left    Passive Hamstring Stretch Limitations  sitting; foot propped on stool      Knee/Hip Exercises: Aerobic   Nustep  L1 x 6 min within pain-less ROM      Knee/Hip Exercises: Standing   Hip Extension  Stengthening;Right;Left;1 set;15 reps;Knee straight;Limitations    Extension Limitations  at counter top; cues to avoid ant trunk lean'      Knee/Hip Exercises: Supine   Quad Sets  Strengthening;Right;1 set;10 reps;Limitations    Quad Sets Limitations  10x10"    Bridges  Strengthening;Both;10 reps;2 sets    Bridges Limitations  good tolerance    Straight Leg Raises  Right;10 reps;1 set    Straight Leg Raises Limitations  cues for quad set before each rep    Straight Leg Raise  with External Rotation  Strengthening;Right;1 set;15 reps      Knee/Hip Exercises: Sidelying   Hip ABduction  Right;Strengthening;1 set;15 reps    Hip ABduction Limitations  good form    Hip ADduction  Right;Strengthening;1 set;15 reps    Hip ADduction Limitations  R LE propped on bolster for comfort      Knee/Hip Exercises: Prone   Hip Extension  Strengthening;Right;10 reps;Limitations;Left;1 set    Hip Extension Limitations  prone donkey kicks; cues to increase knee flexion      Vasopneumatic   Number Minutes Vasopneumatic   10 minutes    Vasopnuematic Location   Knee   R   Vasopneumatic Pressure  Medium    Vasopneumatic Temperature   Coldest temp             PT Education - 10/14/17 1205    Education Details  review of post-op surgical precautions, WBing precautions, post-op HEP    Person(s) Educated  Patient    Methods  Explanation;Demonstration;Tactile cues;Verbal cues;Handout     Comprehension  Verbalized understanding;Returned demonstration       PT Short Term Goals - 10/07/17 1058      PT SHORT TERM GOAL #1   Title  Patient independent with initial HEP.    Time  2    Period  Weeks    Status  Achieved        PT Long Term Goals - 10/07/17 1058      PT LONG TERM GOAL #1   Title  Patient to be independnet with advanced HEP.    Time  4    Period  Weeks    Status  On-going      PT LONG TERM GOAL #2   Title  Patient to demonstrate R knee AROM/PROM 0-120 without pain limiting.    Time  4    Period  Weeks    Status  On-going      PT LONG TERM GOAL #3   Title  Patient to demonstrate B LE strength >=4+/5.    Time  4    Period  Weeks    Status  On-going      PT LONG TERM GOAL #4   Title  Patient to demonstrate equal step length, weight shift, and knee flexion throughout ambulation with LRAD.    Time  4    Period  Weeks    Status  On-going            Plan - 10/14/17 1207    Clinical Impression Statement  Patient arrived to session without use of crutches. Report she has had increased pain and popping in R knee since last session with end range flexion activities. Made sure to avoid painful motions throughout session today. Spent considerable amount of time educating patient on post-op surgical precautions, use of brace and crutches, and post-op HEP. Advised patient to perform these exercises as tolerated and discontinue if they cause increased or lasting pain. Patient reported understanding. Focused on LE strengthening with cues intermittently required to correct form. Patient still noting some edema in R knee which was addressed with Gameready at end of session. Normal integumentary response noted at end of session.     PT Treatment/Interventions  ADLs/Self Care Home Management;Cryotherapy;Electrical Stimulation;Iontophoresis 4mg /ml Dexamethasone;Moist Heat;Ultrasound;Gait training;Stair training;Functional mobility training;DME Instruction;Therapeutic  activities;Therapeutic exercise;Manual techniques;Patient/family education;Neuromuscular re-education;Balance training;Passive range of motion;Dry needling;Energy conservation;Scar mobilization;Splinting;Taping;Vasopneumatic Device    Consulted and Agree with Plan of Care  Patient       Patient  will benefit from skilled therapeutic intervention in order to improve the following deficits and impairments:  Increased edema, Decreased activity tolerance, Decreased strength, Pain, Decreased mobility, Difficulty walking, Decreased range of motion, Impaired flexibility, Hypermobility  Visit Diagnosis: Acute pain of right knee  Stiffness of right knee, not elsewhere classified  Muscle weakness (generalized)  Difficulty in walking, not elsewhere classified     Problem List Patient Active Problem List   Diagnosis Date Noted  . Kidney stone 10/09/2017  . Preoperative cardiovascular examination 10/09/2017  . Essential hypertension 06/09/2017  . History of kidney stones 04/08/2017  . Family history of Marfan syndrome 04/08/2017  . Migraine 04/08/2017     Anette Guarneri, PT, DPT 10/14/17 12:13 PM   [facility] [address]  [address] [place], [place], [address] Phone: [phone]   Fax:  [phone]  Name: GENESE QUEBEDEAUX MRN: [id number] Date of Birth: October 24, 1995

## 2017-10-15 NOTE — Addendum Note (Signed)
Addended by: Craige Cotta on: 10/15/2017 08:35 AM   Modules accepted: Orders

## 2017-10-16 ENCOUNTER — Ambulatory Visit: Payer: No Typology Code available for payment source

## 2017-10-16 DIAGNOSIS — M25561 Pain in right knee: Secondary | ICD-10-CM

## 2017-10-16 DIAGNOSIS — R262 Difficulty in walking, not elsewhere classified: Secondary | ICD-10-CM

## 2017-10-16 DIAGNOSIS — M25661 Stiffness of right knee, not elsewhere classified: Secondary | ICD-10-CM

## 2017-10-16 DIAGNOSIS — M6281 Muscle weakness (generalized): Secondary | ICD-10-CM

## 2017-10-16 NOTE — Therapy (Signed)
[facility] [address]  [place], [place], [address] Phone: [phone]   Fax:  [phone]  Physical Therapy Treatment  Patient Details  Name: Carol Pugh MRN: [id number] Date of Birth: April 09, 1995 Referring Provider (PT): Meredith Pel, MD   Encounter Date: 10/16/2017  PT End of Session - 10/16/17 1025    Visit Number  6    Number of Visits  13    Date for PT Re-Evaluation  10/21/17    Authorization Type  [facility]    PT Start Time  1016    PT Stop Time  1104    PT Time Calculation (min)  48 min    Activity Tolerance  Patient tolerated treatment well    Behavior During Therapy  [facility] for tasks assessed/performed       Past Medical History:  Diagnosis Date  . Anxiety   . Depression   . Distal renal tubular acidosis   . Family history of Marfan syndrome   . Headache   . Heart murmur   . History of kidney stones   . Hypertension   . Kidney stones   . Migraine   . PCOS (polycystic ovarian syndrome)     Past Surgical History:  Procedure Laterality Date  . EXTRACORPOREAL SHOCK WAVE LITHOTRIPSY Left 06/11/2017   Procedure: LEFT EXTRACORPOREAL SHOCK WAVE LITHOTRIPSY (ESWL);  Surgeon: Raynelle Bring, MD;  Location: [facility];  Service: Urology;  Laterality: Left;  . urinary stent placements      There were no vitals filed for this visit.  Subjective Assessment - 10/16/17 1024    Subjective  Carol Pugh reporting no issues with latest HEP update.  Feels she is ready for surgery scheduled on 10.7.19.      Pertinent History  distal renal tibular acidosis, family hx of Marfan syndrome, kidney stones, HTN, migraine    Diagnostic tests  per patient MRI R knee 09/08/16: showed patellofemoral lig tear and MCL sprain and ACL rupture     Patient Stated Goals  be able to walk right; get back to work    Currently in Pain?  No/denies    Pain Score  0-No pain   up to "catching" pain of 8/10 with extending knee after end  range flexion    Pain Location  Knee    Pain Orientation  Right    Pain Descriptors / Indicators  Sore    Pain Type  Acute pain    Multiple Pain Sites  No         [facility] PT Assessment - 10/16/17 1041      AROM   AROM Assessment Site  Knee    Right/Left Knee  Right    Right Knee Extension  0    Right Knee Flexion  110      PROM   PROM Assessment Site  Knee    Right/Left Knee  Right    Right Knee Extension  0    Right Knee Flexion  112                   [facility] Adult PT Treatment/Exercise - 10/16/17 1028      Knee/Hip Exercises: Stretches   Hip Flexor Stretch  Right;60 seconds    Hip Flexor Stretch Limitations  mod thomas strap      Knee/Hip Exercises: Aerobic   Recumbent Bike  half rotations for ROM       Knee/Hip Exercises: Standing   Terminal Knee  Extension  Right;15 reps;Strengthening    Terminal Knee Extension Limitations  ball squeeze on wall       Knee/Hip Exercises: Supine   Straight Leg Raises  Right;15 reps    Straight Leg Raises Limitations  1#     Straight Leg Raise with External Rotation  Right;10 reps    Straight Leg Raise with External Rotation Limitations  1#      Knee/Hip Exercises: Sidelying   Hip ABduction  Right;Strengthening;1 set;15 reps    Hip ABduction Limitations  1#    Hip ADduction  Right;Strengthening;1 set;15 reps    Hip ADduction Limitations  1#      Knee/Hip Exercises: Prone   Hip Extension  Strengthening;Right;Limitations;1 set;15 reps    Hip Extension Limitations  1#      Vasopneumatic   Number Minutes Vasopneumatic   10 minutes    Vasopnuematic Location   Knee    Vasopneumatic Pressure  Medium    Vasopneumatic Temperature   Coldest temp               PT Short Term Goals - 10/07/17 1058      PT SHORT TERM GOAL #1   Title  Patient independent with initial HEP.    Time  2    Period  Weeks    Status  Achieved        PT Long Term Goals - 10/16/17 1215      PT LONG TERM GOAL #1   Title  Patient to  be independnet with advanced HEP.    Time  4    Period  Weeks    Status  Partially Met      PT LONG TERM GOAL #2   Title  Patient to demonstrate R knee AROM/PROM 0-120 without pain limiting.    Time  4    Period  Weeks    Status  Partially Met      PT LONG TERM GOAL #3   Title  Patient to demonstrate B LE strength >=4+/5.    Time  4    Period  Weeks    Status  Partially Met   Gross screening revealed 4+/5 with most proximal hip muscle groups     PT LONG TERM GOAL #4   Title  Patient to demonstrate equal step length, weight shift, and knee flexion throughout ambulation with LRAD.    Time  4    Period  Weeks    Status  Partially Met            Plan - 10/16/17 1025    Clinical Impression Statement  Carol Pugh doing well today and notes she is feeling ready for surgery scheduled for Monday.  Session focused on final review of current HEP and proximal hip, HS/quad strengthening.  Pt. tolerated all activities well today and ended visit with ice/compression to R knee to decrease post-exercise swelling and pain    PT Treatment/Interventions  ADLs/Self Care Home Management;Cryotherapy;Electrical Stimulation;Iontophoresis '4mg'$ /ml Dexamethasone;Moist Heat;Ultrasound;Gait training;Stair training;Functional mobility training;DME Instruction;Therapeutic activities;Therapeutic exercise;Manual techniques;Patient/family education;Neuromuscular re-education;Balance training;Passive range of motion;Dry needling;Energy conservation;Scar mobilization;Splinting;Taping;Vasopneumatic Device    Consulted and Agree with Plan of Care  Patient       Patient will benefit from skilled therapeutic intervention in order to improve the following deficits and impairments:  Increased edema, Decreased activity tolerance, Decreased strength, Pain, Decreased mobility, Difficulty walking, Decreased range of motion, Impaired flexibility, Hypermobility  Visit Diagnosis: Acute pain of right knee  Stiffness of right  knee, not elsewhere  classified  Muscle weakness (generalized)  Difficulty in walking, not elsewhere classified     Problem List Patient Active Problem List   Diagnosis Date Noted  . Kidney stone 10/09/2017  . Preoperative cardiovascular examination 10/09/2017  . Essential hypertension 06/09/2017  . History of kidney stones 04/08/2017  . Family history of Marfan syndrome 04/08/2017  . Migraine 04/08/2017    Bess Harvest, PTA 10/16/17 12:17 PM   [facility] [address]  [place], [place], [address] Phone: [phone]   Fax:  [phone]  Name: Carol Pugh MRN: [id number] Date of Birth: 08/09/1995

## 2017-10-19 ENCOUNTER — Ambulatory Visit (HOSPITAL_BASED_OUTPATIENT_CLINIC_OR_DEPARTMENT_OTHER): Payer: No Typology Code available for payment source | Admitting: Certified Registered"

## 2017-10-19 ENCOUNTER — Encounter (HOSPITAL_BASED_OUTPATIENT_CLINIC_OR_DEPARTMENT_OTHER)
Admission: RE | Disposition: A | Discharge status: Home / Self Care | Payer: Self-pay | Source: Ambulatory Visit | Attending: Orthopaedic Surgery

## 2017-10-19 ENCOUNTER — Other Ambulatory Visit: Payer: Self-pay

## 2017-10-19 ENCOUNTER — Encounter (HOSPITAL_BASED_OUTPATIENT_CLINIC_OR_DEPARTMENT_OTHER): Payer: Self-pay | Admitting: *Deleted

## 2017-10-19 ENCOUNTER — Ambulatory Visit (HOSPITAL_BASED_OUTPATIENT_CLINIC_OR_DEPARTMENT_OTHER)
Admission: RE | Admit: 2017-10-19 | Discharge: 2017-10-19 | Disposition: A | Discharge status: Home / Self Care | Payer: No Typology Code available for payment source | Source: Ambulatory Visit | Attending: Orthopaedic Surgery | Admitting: Orthopaedic Surgery

## 2017-10-19 ENCOUNTER — Encounter: Payer: No Typology Code available for payment source | Admitting: Physical Therapy

## 2017-10-19 DIAGNOSIS — F17210 Nicotine dependence, cigarettes, uncomplicated: Secondary | ICD-10-CM | POA: Insufficient documentation

## 2017-10-19 DIAGNOSIS — Z79899 Other long term (current) drug therapy: Secondary | ICD-10-CM | POA: Diagnosis not present

## 2017-10-19 DIAGNOSIS — F329 Major depressive disorder, single episode, unspecified: Secondary | ICD-10-CM | POA: Diagnosis not present

## 2017-10-19 DIAGNOSIS — G43909 Migraine, unspecified, not intractable, without status migrainosus: Secondary | ICD-10-CM | POA: Diagnosis not present

## 2017-10-19 DIAGNOSIS — S83004A Unspecified dislocation of right patella, initial encounter: Secondary | ICD-10-CM | POA: Diagnosis not present

## 2017-10-19 DIAGNOSIS — W19XXXA Unspecified fall, initial encounter: Secondary | ICD-10-CM | POA: Diagnosis not present

## 2017-10-19 DIAGNOSIS — I10 Essential (primary) hypertension: Secondary | ICD-10-CM | POA: Diagnosis not present

## 2017-10-19 DIAGNOSIS — S83511A Sprain of anterior cruciate ligament of right knee, initial encounter: Secondary | ICD-10-CM | POA: Insufficient documentation

## 2017-10-19 HISTORY — PX: KNEE ARTHROSCOPY WITH ANTERIOR CRUCIATE LIGAMENT (ACL) REPAIR WITH HAMSTRING GRAFT: SHX5645

## 2017-10-19 HISTORY — PX: MEDIAL PATELLOFEMORAL LIGAMENT REPAIR: SHX2020

## 2017-10-19 HISTORY — DX: Cardiac murmur, unspecified: R01.1

## 2017-10-19 HISTORY — DX: Polycystic ovarian syndrome: E28.2

## 2017-10-19 HISTORY — DX: Anxiety disorder, unspecified: F41.9

## 2017-10-19 LAB — POCT PREGNANCY, URINE: Preg Test, Ur: NEGATIVE

## 2017-10-19 SURGERY — KNEE ARTHROSCOPY WITH ANTERIOR CRUCIATE LIGAMENT (ACL) REPAIR WITH HAMSTRING GRAFT
Anesthesia: Regional | Site: Knee | Laterality: Right

## 2017-10-19 MED ORDER — ONDANSETRON HCL 4 MG/2ML IJ SOLN
INTRAMUSCULAR | Status: DC | PRN
  Administered 2017-10-19: 4 mg via INTRAVENOUS

## 2017-10-19 MED ORDER — HYDROMORPHONE HCL 1 MG/ML IJ SOLN
INTRAMUSCULAR | Status: AC
  Filled 2017-10-19: qty 0.5

## 2017-10-19 MED ORDER — MIDAZOLAM HCL 2 MG/2ML IJ SOLN
INTRAMUSCULAR | Status: AC
  Filled 2017-10-19: qty 2

## 2017-10-19 MED ORDER — PROMETHAZINE HCL 25 MG/ML IJ SOLN: 6.2500 mg | INTRAMUSCULAR | Status: DC | PRN

## 2017-10-19 MED ORDER — FENTANYL CITRATE (PF) 100 MCG/2ML IJ SOLN
INTRAMUSCULAR | Status: AC
  Filled 2017-10-19: qty 2

## 2017-10-19 MED ORDER — MELOXICAM 7.5 MG PO TABS: 7.5000 mg | ORAL_TABLET | Freq: Every day | ORAL | 2 refills | Status: DC

## 2017-10-19 MED ORDER — ACETAMINOPHEN 500 MG PO TABS: 1000.0000 mg | ORAL_TABLET | Freq: Three times a day (TID) | ORAL | 0 refills | Status: AC

## 2017-10-19 MED ORDER — ACETAMINOPHEN 10 MG/ML IV SOLN
INTRAVENOUS | Status: DC | PRN
  Administered 2017-10-19: 1000 mg via INTRAVENOUS

## 2017-10-19 MED ORDER — ACETAMINOPHEN 10 MG/ML IV SOLN
INTRAVENOUS | Status: AC
  Filled 2017-10-19: qty 100

## 2017-10-19 MED ORDER — VANCOMYCIN HCL 1000 MG IV SOLR
INTRAVENOUS | Status: AC
  Filled 2017-10-19: qty 1000

## 2017-10-19 MED ORDER — CEFAZOLIN SODIUM-DEXTROSE 2-3 GM-%(50ML) IV SOLR
INTRAVENOUS | Status: DC | PRN
  Administered 2017-10-19: 2 g via INTRAVENOUS
  Administered 2017-10-19: 1 g via INTRAVENOUS

## 2017-10-19 MED ORDER — ROPIVACAINE HCL 5 MG/ML IJ SOLN
INTRAMUSCULAR | Status: DC | PRN
  Administered 2017-10-19 (×2): 20 mL via PERINEURAL

## 2017-10-19 MED ORDER — DEXAMETHASONE SODIUM PHOSPHATE 10 MG/ML IJ SOLN
INTRAMUSCULAR | Status: AC
  Filled 2017-10-19: qty 1

## 2017-10-19 MED ORDER — CHLORHEXIDINE GLUCONATE 4 % EX LIQD: 60.0000 mL | Freq: Once | CUTANEOUS | Status: DC

## 2017-10-19 MED ORDER — OXYCODONE HCL 5 MG PO TABS
5.0000 mg | ORAL_TABLET | Freq: Once | ORAL | Status: AC
  Administered 2017-10-19: 5 mg via ORAL

## 2017-10-19 MED ORDER — ONDANSETRON HCL 4 MG PO TABS: 4.0000 mg | ORAL_TABLET | Freq: Three times a day (TID) | ORAL | 1 refills | Status: AC | PRN

## 2017-10-19 MED ORDER — CEFAZOLIN SODIUM-DEXTROSE 2-4 GM/100ML-% IV SOLN: 2.0000 g | INTRAVENOUS | Status: DC

## 2017-10-19 MED ORDER — SODIUM CHLORIDE 0.9 % IR SOLN
Status: DC | PRN
  Administered 2017-10-19: 12000 mL

## 2017-10-19 MED ORDER — SCOPOLAMINE 1 MG/3DAYS TD PT72: 1.0000 | MEDICATED_PATCH | Freq: Once | TRANSDERMAL | Status: DC | PRN

## 2017-10-19 MED ORDER — OXYCODONE HCL 5 MG PO TABS
ORAL_TABLET | ORAL | Status: AC
  Filled 2017-10-19: qty 1

## 2017-10-19 MED ORDER — HYDROMORPHONE HCL 1 MG/ML IJ SOLN
0.2500 mg | INTRAMUSCULAR | Status: DC | PRN
  Administered 2017-10-19 (×2): 0.5 mg via INTRAVENOUS

## 2017-10-19 MED ORDER — OXYCODONE HCL 5 MG PO TABS: ORAL_TABLET | ORAL | 0 refills | Status: AC

## 2017-10-19 MED ORDER — CEFAZOLIN SODIUM-DEXTROSE 2-4 GM/100ML-% IV SOLN
INTRAVENOUS | Status: AC
  Filled 2017-10-19: qty 100

## 2017-10-19 MED ORDER — ONDANSETRON HCL 4 MG/2ML IJ SOLN
INTRAMUSCULAR | Status: AC
  Filled 2017-10-19: qty 2

## 2017-10-19 MED ORDER — MIDAZOLAM HCL 2 MG/2ML IJ SOLN
1.0000 mg | INTRAMUSCULAR | Status: DC | PRN
  Administered 2017-10-19 (×2): 2 mg via INTRAVENOUS

## 2017-10-19 MED ORDER — PROPOFOL 10 MG/ML IV BOLUS
INTRAVENOUS | Status: AC
  Filled 2017-10-19: qty 40

## 2017-10-19 MED ORDER — VANCOMYCIN HCL 1000 MG IV SOLR
INTRAVENOUS | Status: DC | PRN
  Administered 2017-10-19: 1000 mg

## 2017-10-19 MED ORDER — PROPOFOL 10 MG/ML IV BOLUS
INTRAVENOUS | Status: DC | PRN
  Administered 2017-10-19: 50 mg via INTRAVENOUS
  Administered 2017-10-19: 150 mg via INTRAVENOUS

## 2017-10-19 MED ORDER — DEXAMETHASONE SODIUM PHOSPHATE 10 MG/ML IJ SOLN
INTRAMUSCULAR | Status: DC | PRN
  Administered 2017-10-19: 10 mg via INTRAVENOUS

## 2017-10-19 MED ORDER — LIDOCAINE HCL (CARDIAC) PF 100 MG/5ML IV SOSY
PREFILLED_SYRINGE | INTRAVENOUS | Status: DC | PRN
  Administered 2017-10-19: 60 mg via INTRAVENOUS

## 2017-10-19 MED ORDER — EPINEPHRINE 30 MG/30ML IJ SOLN
INTRAMUSCULAR | Status: AC
  Filled 2017-10-19: qty 1

## 2017-10-19 MED ORDER — LIDOCAINE 2% (20 MG/ML) 5 ML SYRINGE
INTRAMUSCULAR | Status: AC
  Filled 2017-10-19: qty 5

## 2017-10-19 MED ORDER — OMEPRAZOLE 20 MG PO CPDR: 20.0000 mg | DELAYED_RELEASE_CAPSULE | Freq: Every day | ORAL | 0 refills | Status: DC

## 2017-10-19 MED ORDER — LACTATED RINGERS IV SOLN
INTRAVENOUS | Status: DC
  Administered 2017-10-19 (×2): via INTRAVENOUS

## 2017-10-19 MED ORDER — FENTANYL CITRATE (PF) 100 MCG/2ML IJ SOLN
50.0000 ug | INTRAMUSCULAR | Status: AC | PRN
  Administered 2017-10-19 (×4): 50 ug via INTRAVENOUS
  Administered 2017-10-19: 100 ug via INTRAVENOUS

## 2017-10-19 MED ORDER — ESMOLOL HCL 100 MG/10ML IV SOLN
INTRAVENOUS | Status: AC
  Filled 2017-10-19: qty 10

## 2017-10-19 MED ORDER — ASPIRIN 81 MG PO TABS: 81.0000 mg | ORAL_TABLET | Freq: Every day | ORAL | 0 refills | Status: AC

## 2017-10-19 MED FILL — oxyCODONE HCL 5 MG TABS: 5 | 3 days supply | Qty: 30 | Fill #0

## 2017-10-19 MED FILL — MELOXICAM 7.5 MG TABLET: 7.5 | 30 days supply | Qty: 30 | Fill #0

## 2017-10-19 MED FILL — OMEPRAZOLE 20 MG CPDR: 20 | 14 days supply | Qty: 14 | Fill #0

## 2017-10-19 MED FILL — ACETAMINOPHEN 500 MG TABS: 500 | 17 days supply | Qty: 100 | Fill #0

## 2017-10-19 MED FILL — ASPIRIN ADULT LOW STRENGTH: 81 | 45 days supply | Qty: 45 | Fill #0

## 2017-10-19 MED FILL — ONDANSETRON HCL 4 MG TABLET: 4 | 3 days supply | Qty: 10 | Fill #0

## 2017-10-19 SURGICAL SUPPLY — 103 items
ANCHOR BUTTON TIGHTROPE ACL RT (Orthopedic Implant) ×3 IMPLANT
ANCHOR SUT FBRTK TTAIL DBL 2 (Anchor) ×9 IMPLANT
BANDAGE ACE 6X5 VEL STRL LF (GAUZE/BANDAGES/DRESSINGS) ×3 IMPLANT
BANDAGE ESMARK 6X9 LF (GAUZE/BANDAGES/DRESSINGS) IMPLANT
BENZOIN TINCTURE PRP APPL 2/3 (GAUZE/BANDAGES/DRESSINGS) IMPLANT
BLADE HEX COATED 2.75 (ELECTRODE) IMPLANT
BLADE SHAVER BONE 5.0MM X 13CM (MISCELLANEOUS)
BLADE SHAVER BONE 5.0X13 (MISCELLANEOUS) IMPLANT
BLADE SURG 10 STRL SS (BLADE) ×3 IMPLANT
BLADE SURG 15 STRL LF DISP TIS (BLADE) ×1 IMPLANT
BLADE SURG 15 STRL SS (BLADE) ×2
BNDG COHESIVE 4X5 TAN STRL (GAUZE/BANDAGES/DRESSINGS) ×3 IMPLANT
BNDG ESMARK 6X9 LF (GAUZE/BANDAGES/DRESSINGS)
BONE TUNNEL PLUG CANNULATED (MISCELLANEOUS) ×3 IMPLANT
BURR OVAL 8 FLU 4.0MM X 13CM (MISCELLANEOUS)
BURR OVAL 8 FLU 4.0X13 (MISCELLANEOUS) IMPLANT
CHLORAPREP W/TINT 26ML (MISCELLANEOUS) ×3 IMPLANT
CLOSURE STERI-STRIP 1/2X4 (GAUZE/BANDAGES/DRESSINGS) ×1
CLOSURE WOUND 1/2 X4 (GAUZE/BANDAGES/DRESSINGS)
CLSR STERI-STRIP ANTIMIC 1/2X4 (GAUZE/BANDAGES/DRESSINGS) ×2 IMPLANT
COVER BACK TABLE 60X90IN (DRAPES) ×3 IMPLANT
CUFF TOURNIQUET SINGLE 34IN LL (TOURNIQUET CUFF) ×3 IMPLANT
DECANTER SPIKE VIAL GLASS SM (MISCELLANEOUS) IMPLANT
DISSECTOR 3.5MM X 13CM CVD (MISCELLANEOUS) IMPLANT
DISSECTOR 4.0MMX13CM CVD (MISCELLANEOUS) IMPLANT
DRAPE ARTHROSCOPY W/POUCH 90 (DRAPES) ×3 IMPLANT
DRAPE IMP U-DRAPE 54X76 (DRAPES) ×3 IMPLANT
DRAPE OEC MINIVIEW 54X84 (DRAPES) ×3 IMPLANT
DRAPE SWITCH (DRAPES) IMPLANT
DRAPE TOP ARMCOVERS (MISCELLANEOUS) ×3 IMPLANT
DRAPE U-SHAPE 47X51 STRL (DRAPES) ×3 IMPLANT
DRSG EMULSION OIL 3X3 NADH (GAUZE/BANDAGES/DRESSINGS) IMPLANT
ELECT REM PT RETURN 9FT ADLT (ELECTROSURGICAL) ×3
ELECTRODE REM PT RTRN 9FT ADLT (ELECTROSURGICAL) ×1 IMPLANT
FIBERSTICK 2 (SUTURE) IMPLANT
FLEX REAMER 8.0MM (DRILL) ×3 IMPLANT
GAUZE SPONGE 4X4 12PLY STRL (GAUZE/BANDAGES/DRESSINGS) ×6 IMPLANT
GAUZE XEROFORM 1X8 LF (GAUZE/BANDAGES/DRESSINGS) IMPLANT
GLOVE BIOGEL PI IND STRL 7.0 (GLOVE) ×1 IMPLANT
GLOVE BIOGEL PI IND STRL 8 (GLOVE) ×2 IMPLANT
GLOVE BIOGEL PI INDICATOR 7.0 (GLOVE) ×2
GLOVE BIOGEL PI INDICATOR 8 (GLOVE) ×4
GLOVE ECLIPSE 6.5 STRL STRAW (GLOVE) ×6 IMPLANT
GLOVE ECLIPSE 8.0 STRL XLNG CF (GLOVE) ×6 IMPLANT
GOWN STRL REUS W/ TWL LRG LVL3 (GOWN DISPOSABLE) ×1 IMPLANT
GOWN STRL REUS W/ TWL XL LVL3 (GOWN DISPOSABLE) ×1 IMPLANT
GOWN STRL REUS W/TWL LRG LVL3 (GOWN DISPOSABLE) ×2
GOWN STRL REUS W/TWL XL LVL3 (GOWN DISPOSABLE) ×5 IMPLANT
GRAFT ROPE FROZEN (Tissue) ×3 IMPLANT
GUIDEPIN FLEX PATHFINDER 2.4MM (WIRE) ×3 IMPLANT
IMMOBILIZER KNEE 22 UNIV (SOFTGOODS) IMPLANT
IMMOBILIZER KNEE 24 THIGH 36 (MISCELLANEOUS) ×1 IMPLANT
IMMOBILIZER KNEE 24 UNIV (MISCELLANEOUS) ×3
IMPL SCREW BIO 8X30 (Screw) ×1 IMPLANT
IMPLANT SCREW BIO 8X30 (Screw) ×3 IMPLANT
IV NS IRRIG 3000ML ARTHROMATIC (IV SOLUTION) ×12 IMPLANT
KIT BIO-TENODESIS 3X8 DISP (MISCELLANEOUS)
KIT INSRT BABSR STRL DISP BTN (MISCELLANEOUS) IMPLANT
KIT LEG STABILIZATION (KITS) IMPLANT
KIT STR SPEAR 1.8 FBRTK DISP (KITS) ×3 IMPLANT
KIT TRANSTIBIAL (DISPOSABLE) ×3 IMPLANT
KNEE WRAP E Z 3 GEL PACK (MISCELLANEOUS) ×3 IMPLANT
MANIFOLD NEPTUNE II (INSTRUMENTS) ×3 IMPLANT
NDL SAFETY ECLIPSE 18X1.5 (NEEDLE) IMPLANT
NDL SUT 6 .5 CRC .975X.05 MAYO (NEEDLE) IMPLANT
NEEDLE HYPO 18GX1.5 SHARP (NEEDLE)
NEEDLE MAYO TAPER (NEEDLE)
NS IRRIG 1000ML POUR BTL (IV SOLUTION) ×3 IMPLANT
PACK ARTHROSCOPY DSU (CUSTOM PROCEDURE TRAY) ×3 IMPLANT
PACK BASIN DAY SURGERY FS (CUSTOM PROCEDURE TRAY) ×3 IMPLANT
PAD CAST 4YDX4 CTTN HI CHSV (CAST SUPPLIES) ×2 IMPLANT
PADDING CAST COTTON 4X4 STRL (CAST SUPPLIES) ×4
PENCIL BUTTON HOLSTER BLD 10FT (ELECTRODE) ×3 IMPLANT
PROBE BIPOLAR ATHRO 135MM 90D (MISCELLANEOUS) ×3 IMPLANT
SCREW BIOCOMP 7X20 (Screw) ×3 IMPLANT
SHEET MEDIUM DRAPE 40X70 STRL (DRAPES) ×3 IMPLANT
SLEEVE SCD COMPRESS KNEE MED (MISCELLANEOUS) ×3 IMPLANT
SPONGE LAP 4X18 RFD (DISPOSABLE) ×3 IMPLANT
STRIP CLOSURE SKIN 1/2X4 (GAUZE/BANDAGES/DRESSINGS) IMPLANT
SUT ETHILON 3 0 PS 1 (SUTURE) IMPLANT
SUT ETHILON 4 0 PS 2 18 (SUTURE) IMPLANT
SUT FIBERWIRE #2 38 T-5 BLUE (SUTURE) ×9
SUT MNCRL AB 3-0 PS2 18 (SUTURE) IMPLANT
SUT MNCRL AB 4-0 PS2 18 (SUTURE) ×3 IMPLANT
SUT VIC AB 0 CT1 27 (SUTURE) ×2
SUT VIC AB 0 CT1 27XBRD ANBCTR (SUTURE) ×1 IMPLANT
SUT VIC AB 1 CT1 27 (SUTURE) ×4
SUT VIC AB 1 CT1 27XBRD ANBCTR (SUTURE) ×2 IMPLANT
SUT VIC AB 2-0 SH 27 (SUTURE) ×2
SUT VIC AB 2-0 SH 27XBRD (SUTURE) ×1 IMPLANT
SUTURE FIBERWR #2 38 T-5 BLUE (SUTURE) ×3 IMPLANT
SUTURE TAPE 1.3 FIBERLOP 20 ST (SUTURE) ×2 IMPLANT
SUTURETAPE 1.3 FIBERLOOP 20 ST (SUTURE) ×6
SYR 5ML LUER SLIP (SYRINGE) IMPLANT
TAPE CLOTH 3X10 TAN LF (GAUZE/BANDAGES/DRESSINGS) IMPLANT
TENDON SEMI-TENDINOSUS (Bone Implant) ×3 IMPLANT
TOWEL GREEN STERILE FF (TOWEL DISPOSABLE) ×9 IMPLANT
TOWEL OR NON WOVEN STRL DISP B (DISPOSABLE) IMPLANT
TUBE CONNECTING 20'X1/4 (TUBING) ×1
TUBE CONNECTING 20X1/4 (TUBING) ×2 IMPLANT
TUBE SUCTION HIGH CAP CLEAR NV (SUCTIONS) ×3 IMPLANT
TUBING ARTHROSCOPY IRRIG 16FT (MISCELLANEOUS) ×3 IMPLANT
WATER STERILE IRR 1000ML POUR (IV SOLUTION) ×3 IMPLANT

## 2017-10-19 NOTE — Discharge Instructions (Signed)

## 2017-10-19 NOTE — H&P (Signed)
PREOPERATIVE H&P  Chief Complaint: Sprain of unspecified cruciate ligament of unspecified knee, initial encounter, Chronic instability of knee, right knee  S83.509A, M23.61  HPI: Carol Pugh is a 22 y.o. female who presents for preoperative history and physical with a diagnosis of Sprain of unspecified cruciate ligament of unspecified knee, initial encounter, Chronic instability of knee, right knee  S83.509A, M23.61. Symptoms are rated as moderate to severe, and have been worsening.  This is significantly impairing activities of daily living.  Please see my clinic note for full details on this patient's care.  She has elected for surgical management.   Past Medical History:  Diagnosis Date  . Anxiety   . Depression   . Distal renal tubular acidosis   . Family history of Marfan syndrome   . Headache   . Heart murmur   . History of kidney stones   . Hypertension   . Kidney stones   . Migraine   . PCOS (polycystic ovarian syndrome)    Past Surgical History:  Procedure Laterality Date  . EXTRACORPOREAL SHOCK WAVE LITHOTRIPSY Left 06/11/2017   Procedure: LEFT EXTRACORPOREAL SHOCK WAVE LITHOTRIPSY (ESWL);  Surgeon: Heloise Purpura, MD;  Location: [facility];  Service: Urology;  Laterality: Left;  . urinary stent placements     Social History   Socioeconomic History  . Marital status: Married    Spouse name: Not on file  . Number of children: Not on file  . Years of education: Not on file  . Highest education level: Not on file  Occupational History  . Not on file  Social Needs  . Financial resource strain: Not on file  . Food insecurity:    Worry: Not on file    Inability: Not on file  . Transportation needs:    Medical: Not on file    Non-medical: Not on file  Tobacco Use  . Smoking status: Current Every Day Smoker    Packs/day: 0.50    Years: 5.00    Pack years: 2.50    Types: Cigarettes  . Smokeless tobacco: Never Used  Substance and Sexual Activity  . Alcohol use:  Not Currently    Frequency: Never  . Drug use: Never  . Sexual activity: Not on file  Lifestyle  . Physical activity:    Days per week: Not on file    Minutes per session: Not on file  . Stress: Not on file  Relationships  . Social connections:    Talks on phone: Not on file    Gets together: Not on file    Attends religious service: Not on file    Active member of club or organization: Not on file    Attends meetings of clubs or organizations: Not on file    Relationship status: Not on file  Other Topics Concern  . Not on file  Social History Narrative  . Not on file   Family History  Problem Relation Age of Onset  . Marfan syndrome Father    No Known Allergies Prior to Admission medications   Medication Sig Start Date End Date Taking? Authorizing Provider  buPROPion (WELLBUTRIN XL) 150 MG 24 hr tablet Take 1 tablet (150 mg total) by mouth daily. 07/29/17  Yes Sharlene Dory, DO  clomiPHENE (CLOMID) 50 MG tablet  07/24/17  Yes [provider]  propranolol ER (INDERAL LA) 60 MG 24 hr capsule Take 1 capsule (60 mg total) by mouth daily. 04/08/17  Yes Sharlene Dory, DO  EPINEPHrine  0.3 mg/0.3 mL IJ SOAJ injection Inject into the muscle once.    [provider]  medroxyPROGESTERone (PROVERA) 10 MG tablet  06/24/17   [provider]  SUMAtriptan (IMITREX) 100 MG tablet Take 1 tablet (100 mg total) by mouth every 2 (two) hours as needed for migraine. May repeat in 2 hours if headache persists or recurs. 04/08/17   Sharlene Dory, DO     Positive ROS: All other systems have been reviewed and were otherwise negative with the exception of those mentioned in the HPI and as above.  Physical Exam: General: Alert, no acute distress Cardiovascular: No pedal edema Respiratory: No cyanosis, no use of accessory musculature GI: No organomegaly, abdomen is soft and non-tender Skin: No lesions in the area of chief complaint Neurologic:  Sensation intact distally Psychiatric: Patient is competent for consent with normal mood and affect Lymphatic: No axillary or cervical lymphadenopathy  MUSCULOSKELETAL: R patellar apprehension with translation, +lachman  Assessment: Sprain of unspecified cruciate ligament of unspecified knee, initial encounter, Chronic instability of knee, right knee  S83.509A, M23.61  Plan: Plan for Procedure(s): RIGHT KNEE ARTHROSCOPY WITH ANTERIOR CRUCIATE LIGAMENT (ACL) REPAIR WITH AUTOGRAFT HAMSTRING MEDIAL PATELLA FEMORAL LIGAMENT RECONSTRUCTION 5-64mm TIBIAL ANTERIOR ALLOGRAFT VS SEMI T ALLOGRAFT x 2  The risks benefits and alternatives were discussed with the patient including but not limited to the risks of nonoperative treatment, versus surgical intervention including infection, bleeding, nerve injury,  blood clots, cardiopulmonary complications, morbidity, mortality, among others, and they were willing to proceed.   Bjorn Pippin, MD  10/19/2017 7:13 AM

## 2017-10-19 NOTE — Transfer of Care (Signed)
Immediate Anesthesia Transfer of Care Note  Patient: Carol Pugh  Procedure(s) Performed: RIGHT KNEE ARTHROSCOPY WITH ANTERIOR CRUCIATE LIGAMENT (ACL) REPAIR WITH AUTOGRAFT HAMSTRING (Right Knee) MEDIAL PATELLA FEMORAL LIGAMENT RECONSTRUCTION RIGHT KNEE (Right Knee)  Patient Location: PACU  Anesthesia Type:General  Level of Consciousness: awake, alert  and oriented  Airway & Oxygen Therapy: Patient Spontanous Breathing and Patient connected to face mask oxygen  Post-op Assessment: Report given to RN and Post -op Vital signs reviewed and stable  Post vital signs: Reviewed and stable  Last Vitals:  Vitals Value Taken Time  BP 130/90 10/19/2017 10:18 AM  Temp    Pulse 88 10/19/2017 10:20 AM  Resp 19 10/19/2017 10:20 AM  SpO2 98 % 10/19/2017 10:20 AM  Vitals shown include unvalidated device data.  Last Pain:  Vitals:   10/19/17 0651  PainSc: 0-No pain         Complications: No apparent anesthesia complications

## 2017-10-19 NOTE — Anesthesia Procedure Notes (Signed)
Procedure Name: LMA Insertion Performed by: Elease Swarm M, CRNA Pre-anesthesia Checklist: Patient identified, Emergency Drugs available, Suction available, Patient being monitored and Timeout performed Patient Re-evaluated:Patient Re-evaluated prior to induction Oxygen Delivery Method: Circle system utilized Preoxygenation: Pre-oxygenation with 100% oxygen Induction Type: IV induction LMA: LMA inserted LMA Size: 4.0 Tube type: Oral Number of attempts: 1 Placement Confirmation: positive ETCO2,  CO2 detector and breath sounds checked- equal and bilateral Tube secured with: Tape Dental Injury: Teeth and Oropharynx as per pre-operative assessment        

## 2017-10-19 NOTE — Anesthesia Postprocedure Evaluation (Signed)
Anesthesia Post Note  Patient: Carol Pugh  Procedure(s) Performed: RIGHT KNEE ARTHROSCOPY WITH ANTERIOR CRUCIATE LIGAMENT (ACL) REPAIR WITH AUTOGRAFT HAMSTRING (Right Knee) MEDIAL PATELLA FEMORAL LIGAMENT RECONSTRUCTION RIGHT KNEE (Right Knee)     Anesthesia Type: Regional and General    Last Vitals:  Vitals:   10/19/17 1023 10/19/17 1136  BP:  (!) 138/96  Pulse: 90 90  Resp: 15 18  Temp:  37 C  SpO2: 100% 97%    Last Pain:  Vitals:   10/19/17 1136  PainSc: 5                  Trayshawn Durkin P Sherod Cisse

## 2017-10-19 NOTE — Progress Notes (Signed)
Assisted Dr. Ellender with right, ultrasound guided, popliteal, adductor canal block. Side rails up, monitors on throughout procedure. See vital signs in flow sheet. Tolerated Procedure well. 

## 2017-10-19 NOTE — Anesthesia Procedure Notes (Signed)
Anesthesia Regional Block: Popliteal block   Pre-Anesthetic Checklist: ,, timeout performed, Correct Patient, Correct Site, Correct Laterality, Correct Procedure,, site marked, risks and benefits discussed, Surgical consent,  Pre-op evaluation,  At surgeon's request and post-op pain management  Laterality: Right  Prep: chloraprep       Needles:  Injection technique: Single-shot  Needle Type: Echogenic Stimulator Needle     Needle Length: 10cm  Needle Gauge: 21     Additional Needles:   Procedures:,,,, ultrasound used (permanent image in chart),,,,  Narrative:  Start time: 10/19/2017 7:10 AM End time: 10/19/2017 7:20 AM Injection made incrementally with aspirations every 5 mL.  Performed by: Personally  Anesthesiologist: Leonides Grills, MD  Additional Notes: Functioning IV was confirmed and monitors were applied.  A 21ga [place] echogenic stimulator needle was used. Sterile prep, hand hygiene and sterile gloves were used.  Negative aspiration and negative test dose prior to incremental administration of local anesthetic. The patient tolerated the procedure well.

## 2017-10-19 NOTE — Anesthesia Preprocedure Evaluation (Addendum)
Anesthesia Evaluation  Patient identified by MRN, date of birth, ID band Patient awake    Reviewed: Allergy & Precautions, NPO status , Patient's Chart, lab work & pertinent test results, reviewed documented beta blocker date and time   Airway Mallampati: II  TM Distance: >3 FB Neck ROM: Full    Dental no notable dental hx.    Pulmonary Current Smoker,    Pulmonary exam normal breath sounds clear to auscultation       Cardiovascular hypertension, Pt. on medications and Pt. on home beta blockers Normal cardiovascular exam Rhythm:Regular Rate:Normal  ECG: NSR, rate 84  ECHO: Left ventricle: The cavity size was normal. Wall thickness was normal. Systolic function was normal. The estimated ejection fraction was in the range of 60% to 65%. Wall motion was normal; there were no regional wall motion abnormalities. Left ventricular diastolic function parameters were normal.  Pre-op eval per cardiology   Neuro/Psych  Headaches, PSYCHIATRIC DISORDERS Anxiety Depression    GI/Hepatic negative GI ROS, Neg liver ROS,   Endo/Other  PCOS (polycystic ovarian syndrome)  Renal/GU negative Renal ROS     Musculoskeletal negative musculoskeletal ROS (+)   Abdominal (+) + obese,   Peds  Hematology negative hematology ROS (+)   Anesthesia Other Findings Sprain of unspecified cruciate ligament of unspecified knee  Chronic instability of knee, right knee   Reproductive/Obstetrics hcg negative                            Anesthesia Physical Anesthesia Plan  ASA: III  Anesthesia Plan: General and Regional   Post-op Pain Management: GA combined w/ Regional for post-op pain   Induction: Intravenous  PONV Risk Score and Plan: 2 and Ondansetron, Dexamethasone, Midazolam and Treatment may vary due to age or medical condition  Airway Management Planned: LMA  Additional Equipment:   Intra-op Plan:    Post-operative Plan: Extubation in OR  Informed Consent: I have reviewed the patients History and Physical, chart, labs and discussed the procedure including the risks, benefits and alternatives for the proposed anesthesia with the patient or authorized representative who has indicated his/her understanding and acceptance.   Dental advisory given  Plan Discussed with: CRNA  Anesthesia Plan Comments:         Anesthesia Quick Evaluation

## 2017-10-19 NOTE — Anesthesia Procedure Notes (Signed)
Anesthesia Regional Block: Adductor canal block   Pre-Anesthetic Checklist: ,, timeout performed, Correct Patient, Correct Site, Correct Laterality, Correct Procedure,, site marked, risks and benefits discussed, Surgical consent,  Pre-op evaluation,  At surgeon's request and post-op pain management  Laterality: Right  Prep: chloraprep       Needles:  Injection technique: Single-shot  Needle Type: Echogenic Stimulator Needle     Needle Length: 10cm  Needle Gauge: 21     Additional Needles:   Procedures:,,,, ultrasound used (permanent image in chart),,,,  Narrative:  Start time: 10/19/2017 7:20 AM End time: 10/19/2017 7:25 AM Injection made incrementally with aspirations every 5 mL.  Performed by: Personally  Anesthesiologist: Leonides Grills, MD  Additional Notes: Functioning IV was confirmed and monitors were applied. A time-out was performed. Hand hygiene and sterile gloves were used. The thigh was placed in a frog-leg position and prepped in a sterile fashion. A 21ga Pajunk echogenic stimulator needle was placed using ultrasound guidance.  Negative aspiration and negative test dose prior to incremental administration of local anesthetic. The patient tolerated the procedure well.

## 2017-10-19 NOTE — Op Note (Signed)
Orthopaedic Surgery Operative Note (CSN: [id number])  Carol Pugh  02-03-1995 Date of Surgery: 10/19/2017   Diagnoses:  Right ACL tear and patellar dislocation  Procedure: Right ACL reconstruction hamstring autograft with augmented allograft strand Right MPFL reconstruction with allograft   Operative Finding Successful completion of planned procedure.  Patient had quadrants of translation of the patella prior to the surgery and had significant instability of the knee with a positive Lockman.  The end of the surgery she had 1-1/2 quadrants of translation of the patella and stable endpoint with minimal translation on Lachman.  The joint was essentially pristine with no cartilage or meniscal pathology, suprapatellar pouch intercondylar notch were normal with the exception of the ACL being torn.  There was one area of frayed meniscus at the posterior lateral meniscus.  It was debrided.  Post-operative plan: The patient will be weightbearing as tolerated in a knee immobilizer.  The patient will be discharged home.  DVT prophylaxis aspirin 81 mg a day.  Pain control with PRN pain medication preferring oral medicines.  Follow up plan will be scheduled in approximately 7 days for incision check and XR.  Post-Op Diagnosis: Same Surgeons:Primary: Bjorn Pippin, MD Assistants: Janace Litten, OPAC Location: [facility] OR ROOM 5 Anesthesia: Choice Antibiotics: Ancef 3g preop, Vancomycin 1000mg  locally  Tourniquet time:  Total Tourniquet Time Documented: Thigh (Right) - 119 minutes Total: Thigh (Right) - 119 minutes  Estimated Blood Loss: minimal Complications: None Specimens: None Implants: * No implants in log *  Indications for Surgery:   Carol Pugh is a 22 y.o. female with fall while dancing resulting in a ACL tear and a patellar dislocation.  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  We talked her about the risk benefits alternatives of an ACL and MPFL combined reconstruction.  She understood that her risk of stiffness would be higher.  Benefits and risks of operative and nonoperative management were discussed prior to surgery with patient/guardian(s) and informed consent form was completed.  Specific risks including infection, need for additional surgery, stiffness, re-tear of grafts, continued patellar instability, continued anterior knee pain.   Procedure:   The patient was identified in the preoperative holding area where the surgical site was marked. The patient was taken to the OR where a procedural timeout was called and the above noted anesthesia was induced.  The patient was positioned supine on a regular bed.  Preoperative antibiotics were dosed.  The patient's right knee was prepped and draped in the usual sterile fashion.  A second preoperative timeout was called.      Initially we turned our attention to the hamstring harvest.  We made a 3 cm incision overlying the hamstring tendons about 3 cm distal to the medial joint line longitudinally.  We took care to open the skin sharply and achieve hemostasis as we progressed.  We identified the fascia overlying the muscle tendons and open the sartorial fascia with a knife in an L-type configuration.  We dissected and elevated this carefully taking care to avoid the saphenous nerve posteriorly.  We then identified the gracilis and semitendinosus tendons and harvested both sequentially taking care not to truncate the tendons.  Total tendon length about 210 mm in each.  Graft prep included removing muscular tissue and whipstitching the  ends to make a quadruple hamstring graft 7 mm in size which was augmented to 8mm with a non-irradiated allograft.  We began arthroscopy and made our lateral and  medial portals in the typical fashion. Fat pad was resected and diagnostic arthroscopy performed with the findings listed above.   The anterior cruciate ligament stump was debrided utilizing a shaver taking great care to preserve the remnant stump on the femur and the tibia for localization of our tunnels. Once the remnant anterior cruciate ligament was removed and we obtained appropriate visualization by performing a small notchplasty and confirmed that we had indeed identify the over-the-top position. We made small marks at the location of the aperture of the tibial and femoral tunnels and double checked our location prior to drilling.  We then used a Arthrex tibial guide to ream a 8 mm tunnel from 1.5 cm medial to the tibial tubercle to our mark for the tibial aperture. At this point tunnels cleared of debris and once we verified there were happy with our tunnel we utilized a [employer] guide with 5.5 mm offset to create our femoral tunnel. A flexible guidepin was placed into the tibial tunnel and into the guide itself and directed at the footprint of the anterior cruciate ligament. We then ensured that the knee was at 90 of flexion and passed the flexible guidepin out the lateral cortex of the femur and through the skin without issue. We verified that we were in the anterior half of the femur to avoid posterior blowout prior to reaming our 8 mm femoral tunnel to a depth of 32 mm. Flexible reamers used to perform this taking great care to not run on power through the tibial tunnel to avoid moving the tibial tunnel more posterior.  We then from outside in percutaneously placed a 4 mm straight reamer through the lateral femoral cortex to allow the button passage.  We then created our femoral tunnel and cleared it of any bony debris using suction and shaver. We double checked that the posterior wall was intact prior to proceeding with passing the graft. A shuttling stitch was passed and the graft  was passed without issue and the graft was shuttled into the knee in the correct orientation.    Attention was turned to the proximal medial patella where a proximal medial patellar skin incision was made and carried down through the skin and subcutaneous tissue.  The medial border of the patella was exposed down to layer 3.  We tagged the superficial tissue which was consistent with the attenuated MPFL remnant.  The joint was not entered.  We then used 2 - 2.8 mm arthrex Fibertak anchor placed at the proximal 25% and 50% marks of the patella from proximal to distal transversely.  These would be used to hold our graft in place using a luggage loop type suture pass.    Our graft was prepped in the form of a doubled over tibialis anterior graft that passed through a 7.43mm tunnel.   This was secured as above to the patella at its mid portion and the two loose tails were then passed under layer 2 to the medial epicondyle.  We then made a 3 cm approach starting at the medial epicondyle extending just proximal and posterior.  We took care to dissect the superficial tissues bluntly and used blunt retraction to ensure that the neurovascular structures were out of our field.   We identified the medial epicondyle.  Blunt dissection was performed below  the fascia outside of the capsule from the medial patella to the adductor tubercle.    Using a Beath pin under fluoroscopy image intensification, the Beath pin was placed at [place] point and placed from a posterior to anterior and distal to proximal direction exiting the lateral thigh.  Good position was noted on the fluoroscopic views.  The Beath pin and the adductor tubercle was over reamed with a 7.83mm cannulated reamer to the far lateral femoral cortex.  The sutures from the semitendinosus graft were then passed used the Beath pin exiting laterally.  With the knee in 30 degrees of flexion, the graft was appropriately tensioned to allow for appropriate medial  lateral stability with approximately 10mm of lateral translation without being excessively tight.  Excellent tension was noted.  A guidepin was then placed in the femoral tunnel and the graft was secured using a 7x25-mm Arthrex biocomposite screw with excellent purchase noted and the medial patellofemoral ligament graft appropriately tensioned.  There was adequate medial lateral stability, but the patella was not excessively tight.  The arthroscope was placed back in the joint to check position and translation of the patella before and after graft fixation noting it to be stable and articulating within the trochlea.  The native MPFL tissue was repaired at both its patellar and femoral origins in a pants over vest style fashion to imbricate this loose tissue with #2 fiberwire.    We turned attention back to ACL.    Femoral fixation was with a tight rope device and we checked its placement on the lateral periosteum with fluoroscopy.  We verified arthroscopically that there is no sign of graft impingement on the notch. We then cycled the knee multiple times and turned our attention to the tibia.  Tibia was fixed with a 8x 30 mm bio composite Arthrex screw.  There was good purchase of the screw and the screw protrusion thus we felt there is no need to back up this fixation.  At this point a gentle Lachman maneuver was performed and there is a stable endpoint and no translation.  Incision was closed in multilayer fashion after irrigation and local vancomycin applied, Skin closed with absorbable suture and Steri-Strips placed. Sterile dressing and knee immobilizer were placed and patient taken to PACU without adverse event.   Janace Litten, OPA-C, present and scrubbed throughout the case, critical for completion in a timely fashion, and for retraction, instrumentation, closure.

## 2017-10-20 NOTE — Addendum Note (Signed)
Addendum  created 10/20/17 0826 by Lance Coon, CRNA   Charge Capture section accepted

## 2017-10-21 ENCOUNTER — Encounter: Payer: Self-pay | Admitting: Physical Therapy

## 2017-10-21 ENCOUNTER — Ambulatory Visit: Payer: No Typology Code available for payment source | Attending: Orthopaedic Surgery | Admitting: Physical Therapy

## 2017-10-21 DIAGNOSIS — M25561 Pain in right knee: Secondary | ICD-10-CM | POA: Insufficient documentation

## 2017-10-21 DIAGNOSIS — R262 Difficulty in walking, not elsewhere classified: Secondary | ICD-10-CM | POA: Insufficient documentation

## 2017-10-21 DIAGNOSIS — M6281 Muscle weakness (generalized): Secondary | ICD-10-CM | POA: Insufficient documentation

## 2017-10-21 DIAGNOSIS — M25661 Stiffness of right knee, not elsewhere classified: Secondary | ICD-10-CM | POA: Insufficient documentation

## 2017-10-21 NOTE — Therapy (Signed)
[facility] [address]  [address] [place], [place], [address] Phone: [phone]   Fax:  [phone]  Physical Therapy Re-Evaluation  Patient Details  Name: Carol Pugh MRN: [id number] Date of Birth: March 20, 1995 Referring Provider (PT): Ramond Marrow, MD   Encounter Date: 10/21/2017  PT End of Session - 10/21/17 1224    Visit Number  7    Number of Visits  23    Date for PT Re-Evaluation  12/16/17    Authorization Type  [facility]    PT Start Time  1013    PT Stop Time  1053    PT Time Calculation (min)  40 min    Activity Tolerance  Patient tolerated treatment well;Patient limited by pain    Behavior During Therapy  [facility] for tasks assessed/performed       Past Medical History:  Diagnosis Date  . Anxiety   . Depression   . Distal renal tubular acidosis   . Family history of Marfan syndrome   . Headache   . Heart murmur   . History of kidney stones   . Hypertension   . Kidney stones   . Migraine   . PCOS (polycystic ovarian syndrome)     Past Surgical History:  Procedure Laterality Date  . EXTRACORPOREAL SHOCK WAVE LITHOTRIPSY Left 06/11/2017   Procedure: LEFT EXTRACORPOREAL SHOCK WAVE LITHOTRIPSY (ESWL);  Surgeon: Heloise Purpura, MD;  Location: [facility];  Service: Urology;  Laterality: Left;  . urinary stent placements      There were no vitals filed for this visit.  Subjective Assessment - 10/21/17 1013    Subjective  Patient underwent R ACL repair with autograft HS with augmented allograft strand and MPFL reconstruction with allograft on 10/19/17. Patient currently walking with crutches and soft extension brace, WBAT. Reports she had a lot of pain the first day after surgery but getting a little better now. Attempted performing HEP at home as tolerated, most difficulty with SLR. Scheduled for MD F/U on 10/27/17 when she will get her hinge brace. She is comfortable using the crutches.    Pertinent History  distal  renal tibular acidosis, family hx of Marfan syndrome, kidney stones, HTN, migraine    Diagnostic tests  per patient MRI R knee 09/08/16: showed patellofemoral lig tear and MCL sprain and ACL rupture     Patient Stated Goals  be able to walk right; get back to work    Currently in Pain?  Yes    Pain Score  5     Pain Location  Knee    Pain Orientation  Right    Pain Descriptors / Indicators  Aching;Sharp;Shooting    Pain Type  Acute pain;Surgical pain         [facility] PT Assessment - 10/21/17 0001      Assessment   Medical Diagnosis  R ACL and MPFL reconstruction    Referring Provider (PT)  Ramond Marrow, MD    Onset Date/Surgical Date  10/19/17    Next MD Visit  10/19/17    Prior Therapy  Yes      Precautions   Precautions  Knee   in brace for 6 wks, avoid 0-60 deg full ex for 6 wks   Precaution Comments  soft extension brace      Restrictions   Weight Bearing Restrictions  Yes    RLE Weight Bearing  Weight bearing as tolerated      Balance Screen   Has  the patient fallen in the past 6 months  No    Has the patient had a decrease in activity level because of a fear of falling?   No    Is the patient reluctant to leave their home because of a fear of falling?   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    Leisure  hiking      Cognition   Overall Cognitive Status  Within Functional Limits for tasks assessed      Observation/Other Assessments   Observations  No excessive drainage after taking off ace wrap and most surgical dressings; small amounts of dried blood on dressings    Focus on Therapeutic  Outcomes (FOTO)   Knee: 26 (74% limited, 39% predicted)      Sensation   Light Touch  Appears Intact      Coordination   Gross Motor Movements are Fluid and Coordinated  Yes   limited by pain     Posture/Postural Control   Posture/Postural Control  Postural limitations    Postural Limitations  Weight shift left;Rounded Shoulders;Posterior pelvic tilt      AROM   AROM Assessment Site  Knee    Right/Left Knee  Right    Right Knee Extension  2    Right Knee Flexion  32      PROM   PROM Assessment Site  Knee    Right/Left Knee  Right    Right Knee Extension  3    Right Knee Flexion  40   c/o pulling     Strength   Strength Assessment Site  Hip;Knee;Ankle    Right/Left Hip  Right;Left    Right Hip Flexion  2/5    Left Hip Flexion  4+/5    Left Hip ABduction  4+/5    Left Hip ADduction  4+/5    Right/Left Knee  Right;Left    Left Knee Flexion  4/5    Left Knee Extension  4/5    Right/Left Ankle  Right;Left    Right Ankle Plantar Flexion  4/5    Right Ankle Inversion  4/5    Left Ankle Dorsiflexion  4+/5    Left Ankle Plantar Flexion  4+/5      Ambulation/Gait   Assistive device  Crutches    Gait Pattern  Step-to pattern;Step-through pattern;Decreased hip/knee flexion - right;Decreased weight shift to right;Decreased stance time - right;Decreased step length - left;Poor foot clearance - right                           PT Education - 10/21/17 1224    Education Details  review of HEP and addition of heel slides to tolerance    Person(s) Educated  Patient    Methods  Explanation;Demonstration;Tactile cues;Verbal cues;Handout    Comprehension  Verbalized understanding;Returned demonstration       PT Short Term Goals - 10/21/17 1235  PT SHORT TERM GOAL #1   Title  Patient independent with initial HEP.    Time  4    Period  Weeks    Status  New    Target Date  11/18/17        PT Long Term Goals - 10/21/17 1236      PT LONG TERM GOAL #1    Title  Patient to be independnet with advanced HEP.    Time  8    Period  Weeks    Status  New    Target Date  12/16/17      PT LONG TERM GOAL #2   Title  Patient to demonstrate R knee AROM/PROM 0-120 without pain limiting.    Time  8    Period  Weeks    Status  New    Target Date  12/16/17      PT LONG TERM GOAL #3   Title  Patient to demonstrate B LE strength >=4+/5.    Time  8    Period  Weeks    Status  New    Target Date  12/16/17      PT LONG TERM GOAL #4   Title  Patient to demonstrate equal step length, weight shift, and knee flexion throughout ambulation with LRAD.    Time  8    Period  Weeks    Status  New    Target Date  12/16/17      PT LONG TERM GOAL #5   Title  Patient to demonstrate SLR withotu evidence of quad lag.     Time  8    Period  Weeks    Status  New    Target Date  12/16/17            Plan - 10/21/17 1225    Clinical Impression Statement  Patient returning today after undergoing R ACL repair with autograft HS with augmented allograft strand and MPFL reconstruction with allograft on 10/19/17. Currently WBAT with crutches and soft extension brace. Patient knowledgeable about surgical precautions and reports MD cleared her to removed dressings and shower, avoiding immersion/soaking. Educated patient on remaining precautions and wear time of brace. Patient today with limited and painful R knee ROM, decreased LE strength, pain, and gait deviations. Removed ace bandage and most surgical dressings- left remaining dressings as they were stuck to steri strips. Surgical site without excessive drainage or bleeding. Reviewed previous HEP and added heel slides to tolerance. Patient reported understanding. Would benefit from skilled PT services 2x/week for 8 weeks to address aforementioned impairments.     PT Frequency  2x / week    PT Duration  8 weeks    PT Treatment/Interventions  ADLs/Self Care Home Management;Cryotherapy;Electrical  Stimulation;Iontophoresis 4mg /ml Dexamethasone;Moist Heat;Ultrasound;Gait training;Stair training;Functional mobility training;DME Instruction;Therapeutic activities;Therapeutic exercise;Manual techniques;Patient/family education;Neuromuscular re-education;Balance training;Passive range of motion;Dry needling;Energy conservation;Scar mobilization;Splinting;Taping;Vasopneumatic Device    PT Next Visit Plan  reassess HEP    Consulted and Agree with Plan of Care  Patient       Patient will benefit from skilled therapeutic intervention in order to improve the following deficits and impairments:  Increased edema, Decreased activity tolerance, Decreased strength, Pain, Decreased mobility, Difficulty walking, Decreased range of motion, Impaired flexibility, Hypermobility, Decreased scar mobility  Visit Diagnosis: Acute pain of right knee  Stiffness of right knee, not elsewhere classified  Muscle weakness (generalized)  Difficulty in walking, not elsewhere classified     Problem List Patient Active Problem List  Diagnosis Date Noted  . Kidney stone 10/09/2017  . Preoperative cardiovascular examination 10/09/2017  . Essential hypertension 06/09/2017  . History of kidney stones 04/08/2017  . Family history of Marfan syndrome 04/08/2017  . Migraine 04/08/2017    Anette Guarneri, PT, DPT 10/21/17 12:38 PM   [facility] [address]  [address] [place], [place], [address] Phone: [phone]   Fax:  [phone]  Name: BENNETT VANSCYOC MRN: [id number] Date of Birth: Sep 11, 1995

## 2017-10-23 ENCOUNTER — Ambulatory Visit: Payer: No Typology Code available for payment source | Admitting: Physical Therapy

## 2017-10-23 ENCOUNTER — Encounter: Payer: Self-pay | Admitting: Physical Therapy

## 2017-10-23 DIAGNOSIS — R262 Difficulty in walking, not elsewhere classified: Secondary | ICD-10-CM

## 2017-10-23 DIAGNOSIS — M25561 Pain in right knee: Secondary | ICD-10-CM

## 2017-10-23 DIAGNOSIS — M6281 Muscle weakness (generalized): Secondary | ICD-10-CM

## 2017-10-23 DIAGNOSIS — M25661 Stiffness of right knee, not elsewhere classified: Secondary | ICD-10-CM

## 2017-10-23 NOTE — Therapy (Signed)
[facility] Outpatient Rehabilitation [facility] [address]  [address] [place], [place], [address] Phone: [phone]   Fax:  [phone]  Physical Therapy Treatment  Patient Details  Name: Carol Pugh MRN: [id number] Date of Birth: [birth year] Referring Provider (PT): Ramond Marrow, MD   Encounter Date: 10/23/2017  PT End of Session - 10/23/17 1139    Visit Number  8    Number of Visits  23    Date for PT Re-Evaluation  12/16/17    Authorization Type  [facility]    PT Start Time  1058    PT Stop Time  1152    PT Time Calculation (min)  54 min    Activity Tolerance  Patient tolerated treatment well;Patient limited by pain    Behavior During Therapy  [facility] for tasks assessed/performed       Past Medical History:  Diagnosis Date  . Anxiety   . Depression   . Distal renal tubular acidosis   . Family history of Marfan syndrome   . Headache   . Heart murmur   . History of kidney stones   . Hypertension   . Kidney stones   . Migraine   . PCOS (polycystic ovarian syndrome)     Past Surgical History:  Procedure Laterality Date  . EXTRACORPOREAL SHOCK WAVE LITHOTRIPSY Left 06/11/2017   Procedure: LEFT EXTRACORPOREAL SHOCK WAVE LITHOTRIPSY (ESWL);  Surgeon: Heloise Purpura, MD;  Location: [facility];  Service: Urology;  Laterality: Left;  . urinary stent placements      There were no vitals filed for this visit.  Subjective Assessment - 10/23/17 1101    Subjective  Patient ambulating with crutches into clinic. Pointed out small blister-like spot on R mid shin. Believes maybe her brace is rubbing. Still having trouble performing exercises but trying.    Pertinent History  distal renal tibular acidosis, family hx of Marfan syndrome, kidney stones, HTN, migraine    How long can you sit comfortably?  unlimited if R LE propped on pillow    Diagnostic tests  per patient MRI R knee 09/08/16: showed patellofemoral lig tear and MCL sprain and ACL rupture     Patient Stated  Goals  be able to walk right; get back to work    Currently in Pain?  Yes    Pain Score  6     Pain Location  Knee    Pain Orientation  Right    Pain Descriptors / Indicators  Aching;Sharp;Shooting    Pain Type  Acute pain;Surgical pain                       [facility] Adult PT Treatment/Exercise - 10/23/17 0001      Exercises   Exercises  Knee/Hip      Knee/Hip Exercises: Stretches   Gastroc Stretch  Right;2 reps;30 seconds   long sitting with strap     Knee/Hip Exercises: Aerobic   Nustep  L1 x 5 min UE/LE within very limited ROM to tolerance, for PROM only      Knee/Hip Exercises: Standing   Hip ADduction  Strengthening;Right;1 set;10 reps;Limitations    Hip ADduction Limitations  at counter top;     Other Standing Knee Exercises  standing weight shifts 5x5" to L at counter top    Other Standing Knee Exercises  standing L knee/hip flexion at counter top x 10      Knee/Hip Exercises: Supine   Quad Sets  Strengthening;Right;1 set;10  reps;Limitations    Quad Sets Limitations  10x10"    Heel Slides  Right;1 set;10 reps;AAROM;Limitations    Heel Slides Limitations  orange pball with strap; to tolerance    Straight Leg Raises  Strengthening;Left;1 set;10 reps;Limitations    Straight Leg Raises Limitations  PT assistance to lift LE; VCs to avoid valsalva      Knee/Hip Exercises: Sidelying   Hip ABduction  Strengthening;Right;1 set;10 reps;Limitations    Hip ADduction  Strengthening;Left;1 set;10 reps;Limitations      Vasopneumatic   Number Minutes Vasopneumatic   15 minutes    Vasopnuematic Location   Knee   R   Vasopneumatic Pressure  Medium    Vasopneumatic Temperature   Coldest temp               PT Short Term Goals - 10/23/17 1144      PT SHORT TERM GOAL #1   Title  Patient independent with initial HEP.    Time  4    Period  Weeks    Status  On-going        PT Long Term Goals - 10/23/17 1145      PT LONG TERM GOAL #1   Title  Patient to  be independnet with advanced HEP.    Time  8    Period  Weeks    Status  On-going      PT LONG TERM GOAL #2   Title  Patient to demonstrate R knee AROM/PROM 0-120 without pain limiting.    Time  8    Period  Weeks    Status  On-going      PT LONG TERM GOAL #3   Title  Patient to demonstrate B LE strength >=4+/5.    Time  8    Period  Weeks    Status  On-going      PT LONG TERM GOAL #4   Title  Patient to demonstrate equal step length, weight shift, and knee flexion throughout ambulation with LRAD.    Time  8    Period  Weeks    Status  On-going      PT LONG TERM GOAL #5   Title  Patient to demonstrate SLR withotu evidence of quad lag.     Time  8    Period  Weeks    Status  On-going            Plan - 10/23/17 1140    Clinical Impression Statement  Patient arrived to session with report of blister-like spot on R LE at mid-tibia. Steri strips covered in blood but otherwise normal incision sites. Advised patient to wrap this part of leg to avoid rubbing of brace. Patient tolerated nustep for PROM warmup. Some difficulty with STS and rolling transfers d/t limited ROM of R LE. Reviewed HEP with intermittent cues for form, however patient with good carryover. Attempted sidelying L hip adduction, however patient unable to tolerance d/t discomfort. Worked on standing hip abduction on R LE and able to perform L knee flexion at counter top to encourage R LE weightbearing. Ended session with Gameready to R LE for pain and edema relief. Patient reporting relief at end of session.     PT Treatment/Interventions  ADLs/Self Care Home Management;Cryotherapy;Electrical Stimulation;Iontophoresis 4mg /ml Dexamethasone;Moist Heat;Ultrasound;Gait training;Stair training;Functional mobility training;DME Instruction;Therapeutic activities;Therapeutic exercise;Manual techniques;Patient/family education;Neuromuscular re-education;Balance training;Passive range of motion;Dry needling;Energy  conservation;Scar mobilization;Splinting;Taping;Vasopneumatic Device    Consulted and Agree with Plan of Care  Patient  Patient will benefit from skilled therapeutic intervention in order to improve the following deficits and impairments:  Increased edema, Decreased activity tolerance, Decreased strength, Pain, Decreased mobility, Difficulty walking, Decreased range of motion, Impaired flexibility, Hypermobility, Decreased scar mobility  Visit Diagnosis: Acute pain of right knee  Stiffness of right knee, not elsewhere classified  Muscle weakness (generalized)  Difficulty in walking, not elsewhere classified     Problem List Patient Active Problem List   Diagnosis Date Noted  . Kidney stone 10/09/2017  . Preoperative cardiovascular examination 10/09/2017  . Essential hypertension 06/09/2017  . History of kidney stones 04/08/2017  . Family history of Marfan syndrome 04/08/2017  . Migraine 04/08/2017    Anette Guarneri, PT, DPT 10/23/17 11:55 AM   [facility] [address]  [address] [place], [place], [address] Phone: [phone]   Fax:  [phone]  Name: Carol Pugh MRN: [id number] Date of Birth: 01-13-1996

## 2017-10-26 ENCOUNTER — Telehealth: Payer: Self-pay | Admitting: Cardiology

## 2017-10-26 NOTE — Telephone Encounter (Signed)
The patient will be awaiting your phone call. Thank you.

## 2017-10-26 NOTE — Telephone Encounter (Signed)
Patient is upset that she got a $300 bill regarding her monitor because she was not told we outsource our monitors. She has [employer] and wants to know why she was not told.

## 2017-10-27 ENCOUNTER — Encounter (HOSPITAL_BASED_OUTPATIENT_CLINIC_OR_DEPARTMENT_OTHER): Payer: Self-pay | Admitting: Orthopaedic Surgery

## 2017-10-28 MED FILL — PROPRANOLOL ER 60 MG CAP: 60 | 30 days supply | Qty: 30 | Fill #5

## 2017-10-28 MED FILL — buPROPion HCL ER (XL) 150 M: 150 | 90 days supply | Qty: 90 | Fill #1

## 2017-10-29 ENCOUNTER — Telehealth: Payer: Self-pay | Admitting: *Deleted

## 2017-10-29 ENCOUNTER — Ambulatory Visit: Payer: No Typology Code available for payment source | Admitting: Physical Therapy

## 2017-10-29 ENCOUNTER — Encounter: Payer: Self-pay | Admitting: Physical Therapy

## 2017-10-29 DIAGNOSIS — M25561 Pain in right knee: Secondary | ICD-10-CM | POA: Diagnosis not present

## 2017-10-29 DIAGNOSIS — R262 Difficulty in walking, not elsewhere classified: Secondary | ICD-10-CM

## 2017-10-29 DIAGNOSIS — M6281 Muscle weakness (generalized): Secondary | ICD-10-CM

## 2017-10-29 DIAGNOSIS — M25661 Stiffness of right knee, not elsewhere classified: Secondary | ICD-10-CM

## 2017-10-29 NOTE — Telephone Encounter (Signed)
Spoke with pt to let her know we are looking into the monitor charges and we will see what we can do. Pt stated her frustration was not knowing that the monitor was through another co. If she had known that she would have called her ins. To make sure it was covered or to get approval. I let pt know that our supervisor French Ana would be calling her back tomorrow 10/18.

## 2017-10-29 NOTE — Therapy (Signed)
[facility] Outpatient Rehabilitation [facility] [address]  [address] [place], [place], [address] Phone: [phone]   Fax:  [phone]  Physical Therapy Treatment  Patient Details  Name: Carol Pugh MRN: [id number] Date of Birth: Sep 21, 1995 Referring Provider (PT): Ramond Marrow, MD   Encounter Date: 10/29/2017  PT End of Session - 10/29/17 1127    Visit Number  9    Number of Visits  23    Date for PT Re-Evaluation  12/16/17    Authorization Type  [facility]    PT Start Time  0930    PT Stop Time  1025    PT Time Calculation (min)  55 min    Activity Tolerance  Patient tolerated treatment well    Behavior During Therapy  [facility] for tasks assessed/performed       Past Medical History:  Diagnosis Date  . Anxiety   . Depression   . Distal renal tubular acidosis   . Family history of Marfan syndrome   . Headache   . Heart murmur   . History of kidney stones   . Hypertension   . Kidney stones   . Migraine   . PCOS (polycystic ovarian syndrome)     Past Surgical History:  Procedure Laterality Date  . EXTRACORPOREAL SHOCK WAVE LITHOTRIPSY Left 06/11/2017   Procedure: LEFT EXTRACORPOREAL SHOCK WAVE LITHOTRIPSY (ESWL);  Surgeon: Heloise Purpura, MD;  Location: [facility];  Service: Urology;  Laterality: Left;  . KNEE ARTHROSCOPY WITH ANTERIOR CRUCIATE LIGAMENT (ACL) REPAIR WITH HAMSTRING GRAFT Right 10/19/2017   Procedure: RIGHT KNEE ARTHROSCOPY WITH ANTERIOR CRUCIATE LIGAMENT (ACL) REPAIR WITH AUTOGRAFT HAMSTRING;  Surgeon: Bjorn Pippin, MD;  Location: [facility];  Service: Orthopedics;  Laterality: Right;  . MEDIAL PATELLOFEMORAL LIGAMENT REPAIR Right 10/19/2017   Procedure: MEDIAL PATELLA FEMORAL LIGAMENT RECONSTRUCTION RIGHT KNEE;  Surgeon: Bjorn Pippin, MD;  Location:  SUR[facility]ERY CENTER;  Service: Orthopedics;  Laterality: Right;  . urinary stent placements      There were no vitals filed for this visit.  Subjective Assessment -  10/29/17 0934    Subjective  Reports she saw MD on Tuesday- gave her hinged extension brace to be locked in extension with ambulation. Reports she is doing better with the exercises.     Pertinent History  distal renal tibular acidosis, family hx of Marfan syndrome, kidney stones, HTN, migraine    How long can you sit comfortably?  unlimited if R LE propped on pillow    Diagnostic tests  per patient MRI R knee 09/08/16: showed patellofemoral lig tear and MCL sprain and ACL rupture     Patient Stated Goals  be able to walk right; get back to work    Currently in Pain?  Yes    Pain Score  7     Pain Location  Knee    Pain Orientation  Right    Pain Descriptors / Indicators  Tightness    Pain Type  Acute pain;Surgical pain                       [facility] Adult PT Treatment/Exercise - 10/29/17 0001      Ambulation/Gait   Ambulation Distance (Feet)  130 Feet    Assistive device  L Axillary Crutch;None    Gait Pattern  Step-through pattern;Decreased dorsiflexion - left;Decreased dorsiflexion - right;Decreased hip/knee flexion - right;Decreased stance time - right;Decreased step length - left;Decreased weight shift to right;Trunk flexed  Ambulation Surface  Level;Indoor    Gait velocity  decreased    Gait Comments  gait training with L axillary crutch and without crutches and CGA- cues for increased R weight shift and decreased anterior trunk lean      Knee/Hip Exercises: Stretches   Passive Hamstring Stretch  Right;2 reps;20 seconds;Limitations    Passive Hamstring Stretch Limitations  supine strap      Knee/Hip Exercises: Aerobic   Nustep  L1 x 5 min UE/LE within very limited ROM to tolerance, for PROM only      Knee/Hip Exercises: Supine   Quad Sets  Strengthening;Right;1 set;10 reps;Limitations    Quad Sets Limitations  10x10" with towel roll     Heel Slides  Right;1 set;10 reps;AAROM;Limitations    Heel Slides Limitations  orange pball with strap; to tolerance     Straight Leg Raises  Strengthening;Left;1 set;10 reps;Limitations    Straight Leg Raises Limitations  mild quad lag; knee locked in extension brace      Vasopneumatic   Number Minutes Vasopneumatic   15 minutes    Vasopnuematic Location   Knee   R   Vasopneumatic Pressure  Medium    Vasopneumatic Temperature   Coldest temp      Manual Therapy   Manual Therapy  Joint mobilization    Joint Mobilization  R patellar mobilizations grade III in inferior, superior, medial directions only to tolerance   patient denied discomfort            PT Education - 10/29/17 1126    Education Details  Edu on patellar glides, avoiding lateral glide to perform at home    Person(s) Educated  Patient    Methods  Explanation;Demonstration    Comprehension  Verbalized understanding       PT Short Term Goals - 10/23/17 1144      PT SHORT TERM GOAL #1   Title  Patient independent with initial HEP.    Time  4    Period  Weeks    Status  On-going        PT Long Term Goals - 10/23/17 1145      PT LONG TERM GOAL #1   Title  Patient to be independnet with advanced HEP.    Time  8    Period  Weeks    Status  On-going      PT LONG TERM GOAL #2   Title  Patient to demonstrate R knee AROM/PROM 0-120 without pain limiting.    Time  8    Period  Weeks    Status  On-going      PT LONG TERM GOAL #3   Title  Patient to demonstrate B LE strength >=4+/5.    Time  8    Period  Weeks    Status  On-going      PT LONG TERM GOAL #4   Title  Patient to demonstrate equal step length, weight shift, and knee flexion throughout ambulation with LRAD.    Time  8    Period  Weeks    Status  On-going      PT LONG TERM GOAL #5   Title  Patient to demonstrate SLR withotu evidence of quad lag.     Time  8    Period  Weeks    Status  On-going            Plan - 10/29/17 1127    Clinical Impression Statement  Patient arrived to session  with crutches and hinged extension brace on R LE after seeing  MD on Tuesday. Adjusted fit of brace for patient at it was not worn properly when she came in. Patient reporting much improved comfort after adjustment. Worked on quad strengthening ther-ex today with much improved quad contraction with quad sets and improved ability to perform SLR with brace in extension. Patient still noting difficulty with heel slides at home, noting she may purchase a physioball as it is easier with the ball. Practiced gait training with L crutch as well as without crutches. Advised patient to wean to 1 crutch to start if comfortable, however gait is still unsteady and with heavy compensations without crutches at this point. Patient reported understanding. Ended session with Gameready to R knee to decrease edema. No complaints at end of session.     PT Treatment/Interventions  ADLs/Self Care Home Management;Cryotherapy;Electrical Stimulation;Iontophoresis 4mg /ml Dexamethasone;Moist Heat;Ultrasound;Gait training;Stair training;Functional mobility training;DME Instruction;Therapeutic activities;Therapeutic exercise;Manual techniques;Patient/family education;Neuromuscular re-education;Balance training;Passive range of motion;Dry needling;Energy conservation;Scar mobilization;Splinting;Taping;Vasopneumatic Device    Consulted and Agree with Plan of Care  Patient       Patient will benefit from skilled therapeutic intervention in order to improve the following deficits and impairments:  Increased edema, Decreased activity tolerance, Decreased strength, Pain, Decreased mobility, Difficulty walking, Decreased range of motion, Impaired flexibility, Hypermobility, Decreased scar mobility  Visit Diagnosis: Acute pain of right knee  Stiffness of right knee, not elsewhere classified  Muscle weakness (generalized)  Difficulty in walking, not elsewhere classified     Problem List Patient Active Problem List   Diagnosis Date Noted  . Kidney stone 10/09/2017  . Preoperative  cardiovascular examination 10/09/2017  . Essential hypertension 06/09/2017  . History of kidney stones 04/08/2017  . Family history of Marfan syndrome 04/08/2017  . Migraine 04/08/2017    Anette Guarneri, PT, DPT 10/29/17 11:34 AM   [facility] [address]  [address] [place], [place], [address] Phone: [phone]   Fax:  [phone]  Name: GWEN SARVIS MRN: [id number] Date of Birth: 08-Aug-1995

## 2017-11-03 ENCOUNTER — Ambulatory Visit: Payer: No Typology Code available for payment source

## 2017-11-03 DIAGNOSIS — R262 Difficulty in walking, not elsewhere classified: Secondary | ICD-10-CM

## 2017-11-03 DIAGNOSIS — M25561 Pain in right knee: Secondary | ICD-10-CM | POA: Diagnosis not present

## 2017-11-03 DIAGNOSIS — M25661 Stiffness of right knee, not elsewhere classified: Secondary | ICD-10-CM

## 2017-11-03 DIAGNOSIS — M6281 Muscle weakness (generalized): Secondary | ICD-10-CM

## 2017-11-03 NOTE — Telephone Encounter (Signed)
Spoke with Architect.  Billing Manager will review and respond back to the patient.  Forward to [employer]. TM

## 2017-11-03 NOTE — Therapy (Signed)
[facility] Outpatient Rehabilitation [facility] [address]  [address] [place], [place], [address] Phone: [phone]   Fax:  [phone]  Physical Therapy Treatment  Patient Details  Name: Carol Pugh MRN: [id number] Date of Birth: November 21, 1995 Referring Provider (PT): Ramond Marrow, MD   Encounter Date: 11/03/2017  PT End of Session - 11/03/17 0940    Visit Number  10    Number of Visits  23    Date for PT Re-Evaluation  12/16/17    Authorization Type  [facility]    PT Start Time  0932    PT Stop Time  1027    PT Time Calculation (min)  55 min    Activity Tolerance  Patient tolerated treatment well    Behavior During Therapy  [facility] for tasks assessed/performed       Past Medical History:  Diagnosis Date  . Anxiety   . Depression   . Distal renal tubular acidosis   . Family history of Marfan syndrome   . Headache   . Heart murmur   . History of kidney stones   . Hypertension   . Kidney stones   . Migraine   . PCOS (polycystic ovarian syndrome)     Past Surgical History:  Procedure Laterality Date  . EXTRACORPOREAL SHOCK WAVE LITHOTRIPSY Left 06/11/2017   Procedure: LEFT EXTRACORPOREAL SHOCK WAVE LITHOTRIPSY (ESWL);  Surgeon: Heloise Purpura, MD;  [facility];  Service: Urology;  Laterality: Left;  . KNEE ARTHROSCOPY WITH ANTERIOR CRUCIATE LIGAMENT (ACL) REPAIR WITH HAMSTRING GRAFT Right 10/19/2017   Procedure: RIGHT KNEE ARTHROSCOPY WITH ANTERIOR CRUCIATE LIGAMENT (ACL) REPAIR WITH AUTOGRAFT HAMSTRING;  Surgeon: Bjorn Pippin, MD;  Location: [facility];  Service: Orthopedics;  Laterality: Right;  . MEDIAL PATELLOFEMORAL LIGAMENT REPAIR Right 10/19/2017   Procedure: MEDIAL PATELLA FEMORAL LIGAMENT RECONSTRUCTION RIGHT KNEE;  Surgeon: Bjorn Pippin, MD;  Location: [facility];  Service: Orthopedics;  Laterality: Right;  . urinary stent placements      There were no vitals filed for this visit.  Subjective Assessment -  11/03/17 0939    Subjective  Pt. reporting mainly "tightness" at R knee today.      Pertinent History  distal renal tibular acidosis, family hx of Marfan syndrome, kidney stones, HTN, migraine    Diagnostic tests  per patient MRI R knee 09/08/16: showed patellofemoral lig tear and MCL sprain and ACL rupture     Patient Stated Goals  be able to walk right; get back to work    Currently in Pain?  Yes    Pain Score  6     Pain Location  Knee   "tightness"   Pain Orientation  Right    Pain Descriptors / Indicators  Tightness    Pain Type  Acute pain;Surgical pain    Aggravating Factors   bending knee     Multiple Pain Sites  No         [facility] PT Assessment - 11/03/17 0950      AROM   Right/Left Knee  Right    Right Knee Extension  3    Right Knee Flexion  50      PROM   Right/Left Knee  Right    Right Knee Extension  2    Right Knee Flexion  55      Strength   Right/Left Hip  Right;Left    Right Hip Flexion  4-/5    Left Hip Flexion  4+/5    Left Hip ABduction  4+/5    Right/Left Knee  Right;Left    Right Knee Flexion  --   Not tested due to precautions    Right Knee Extension  --   Not tested due to precautions    Left Knee Flexion  4/5    Left Knee Extension  4/5    Right/Left Ankle  Right;Left    Right Ankle Dorsiflexion  4+/5    Right Ankle Plantar Flexion  4/5    Left Ankle Dorsiflexion  4+/5    Left Ankle Plantar Flexion  4+/5                   [facility] Adult PT Treatment/Exercise - 11/03/17 0941      Knee/Hip Exercises: Stretches   Passive Hamstring Stretch  Right;1 rep;30 seconds    Passive Hamstring Stretch Limitations  supine strap      Knee/Hip Exercises: Aerobic   Nustep  L1 x 8 min UE/LE within very limited ROM to tolerance, for PROM only   Discussed pt. HEP status      Knee/Hip Exercises: Standing   Heel Raises  Both;15 reps    Heel Raises Limitations  chair support    Other Standing Knee Exercises  standing weight shifts 10x5" to L at  counter top      Knee/Hip Exercises: Supine   Quad Sets  Strengthening;Right;1 set;10 reps;Limitations    Quad Sets Limitations  10" x 15 reps     Heel Slides  Right;1 set;AAROM;Limitations;15 reps    Heel Slides Limitations  with heels on peanut p-ball     Straight Leg Raises  Strengthening;Left;1 set;10 reps;Limitations    Straight Leg Raises Limitations  mild quad lag; knee locked in extension brace      Vasopneumatic   Number Minutes Vasopneumatic   15 minutes    Vasopnuematic Location   Knee    Vasopneumatic Pressure  Medium    Vasopneumatic Temperature   Coldest temp      Manual Therapy   Manual Therapy  Joint mobilization    Joint Mobilization  R patellar mobilizations grade III in inferior, superior, medial directions only to tolerance               PT Short Term Goals - 11/03/17 0951      PT SHORT TERM GOAL #1   Title  Patient independent with initial HEP.    Time  4    Period  Weeks    Status  Achieved        PT Long Term Goals - 10/23/17 1145      PT LONG TERM GOAL #1   Title  Patient to be independnet with advanced HEP.    Time  8    Period  Weeks    Status  On-going      PT LONG TERM GOAL #2   Title  Patient to demonstrate R knee AROM/PROM 0-120 without pain limiting.    Time  8    Period  Weeks    Status  On-going      PT LONG TERM GOAL #3   Title  Patient to demonstrate B LE strength >=4+/5.    Time  8    Period  Weeks    Status  On-going      PT LONG TERM GOAL #4   Title  Patient to demonstrate equal step length, weight shift, and knee flexion throughout ambulation with LRAD.  Time  8    Period  Weeks    Status  On-going      PT LONG TERM GOAL #5   Title  Patient to demonstrate SLR withotu evidence of quad lag.     Time  8    Period  Weeks    Status  On-going            Plan - 11/03/17 1026    Clinical Impression Statement  Murl tolerated all activities in session well today.  Improved quad activation evident today  with extension-locked brace SLR.  Able to tolerate addition of heel raise well and demonstrating improved AROM flexion to 50 dg and PROM flexion to 55 dg.  Instructed pt. on gentle inferior/superior self-patellar mobs today to promote improvement in ROM.  Pt. reports consistent HEP adherence.  Ended visit with ice/compression to R knee to decrease soreness/swelling.  Progressing well.      PT Treatment/Interventions  ADLs/Self Care Home Management;Cryotherapy;Electrical Stimulation;Iontophoresis 4mg /ml Dexamethasone;Moist Heat;Ultrasound;Gait training;Stair training;Functional mobility training;DME Instruction;Therapeutic activities;Therapeutic exercise;Manual techniques;Patient/family education;Neuromuscular re-education;Balance training;Passive range of motion;Dry needling;Energy conservation;Scar mobilization;Splinting;Taping;Vasopneumatic Device    Consulted and Agree with Plan of Care  Patient       Patient will benefit from skilled therapeutic intervention in order to improve the following deficits and impairments:  Increased edema, Decreased activity tolerance, Decreased strength, Pain, Decreased mobility, Difficulty walking, Decreased range of motion, Impaired flexibility, Hypermobility, Decreased scar mobility  Visit Diagnosis: Acute pain of right knee  Stiffness of right knee, not elsewhere classified  Muscle weakness (generalized)  Difficulty in walking, not elsewhere classified     Problem List Patient Active Problem List   Diagnosis Date Noted  . Kidney stone 10/09/2017  . Preoperative cardiovascular examination 10/09/2017  . Essential hypertension 06/09/2017  . History of kidney stones 04/08/2017  . Family history of Marfan syndrome 04/08/2017  . Migraine 04/08/2017    Kermit Balo, PTA 11/03/17 12:22 PM   [facility] Outpatient Rehabilitation [facility] [address]  [address] [place], [place], [address] Phone: [phone]   Fax:   [phone]  Name: IVERNA HAMMAC MRN: [id number] Date of Birth: 06-06-1995

## 2017-11-06 ENCOUNTER — Ambulatory Visit: Payer: No Typology Code available for payment source

## 2017-11-06 DIAGNOSIS — M25561 Pain in right knee: Secondary | ICD-10-CM | POA: Diagnosis not present

## 2017-11-06 DIAGNOSIS — R262 Difficulty in walking, not elsewhere classified: Secondary | ICD-10-CM

## 2017-11-06 DIAGNOSIS — M6281 Muscle weakness (generalized): Secondary | ICD-10-CM

## 2017-11-06 DIAGNOSIS — M25661 Stiffness of right knee, not elsewhere classified: Secondary | ICD-10-CM

## 2017-11-06 NOTE — Therapy (Signed)
[facility] [address]  [address] [place], [place], [address] Phone: [phone]   Fax:  [phone]  Physical Therapy Treatment  Patient Details  Name: Carol Pugh MRN: [id number] Date of Birth: [birth year] Referring Provider (PT): Ramond Marrow, MD   Encounter Date: 11/06/2017  PT End of Session - 11/06/17 0935    Visit Number  11    Number of Visits  23    Date for PT Re-Evaluation  12/16/17    Authorization Type  [facility]    PT Start Time  0930    PT Stop Time  1028    PT Time Calculation (min)  58 min    Activity Tolerance  Patient tolerated treatment well    Behavior During Therapy  [facility] for tasks assessed/performed       Past Medical History:  Diagnosis Date  . Anxiety   . Depression   . Distal renal tubular acidosis   . Family history of Marfan syndrome   . Headache   . Heart murmur   . History of kidney stones   . Hypertension   . Kidney stones   . Migraine   . PCOS (polycystic ovarian syndrome)     Past Surgical History:  Procedure Laterality Date  . EXTRACORPOREAL SHOCK WAVE LITHOTRIPSY Left 06/11/2017   Procedure: LEFT EXTRACORPOREAL SHOCK WAVE LITHOTRIPSY (ESWL);  Surgeon: Heloise Purpura, MD;  [facility];  Service: Urology;  Laterality: Left;  . KNEE ARTHROSCOPY WITH ANTERIOR CRUCIATE LIGAMENT (ACL) REPAIR WITH HAMSTRING GRAFT Right 10/19/2017   Procedure: RIGHT KNEE ARTHROSCOPY WITH ANTERIOR CRUCIATE LIGAMENT (ACL) REPAIR WITH AUTOGRAFT HAMSTRING;  Surgeon: Bjorn Pippin, MD;  Location: [facility];  Service: Orthopedics;  Laterality: Right;  . MEDIAL PATELLOFEMORAL LIGAMENT REPAIR Right 10/19/2017   Procedure: MEDIAL PATELLA FEMORAL LIGAMENT RECONSTRUCTION RIGHT KNEE;  Surgeon: Bjorn Pippin, MD;  Location: [facility];  Service: Orthopedics;  Laterality: Right;  . urinary stent placements      There were no vitals filed for this visit.  Subjective Assessment -  11/06/17 0934    Subjective  Pt. noting soreness yesterday after, "being up on it running errands", which subsided this morning.      Pertinent History  distal renal tibular acidosis, family hx of Marfan syndrome, kidney stones, HTN, migraine    Diagnostic tests  per patient MRI R knee 09/08/16: showed patellofemoral lig tear and MCL sprain and ACL rupture     Patient Stated Goals  be able to walk right; get back to work    Currently in Pain?  No/denies    Pain Score  0-No pain   up to 4/10 at worst    Pain Location  Knee    Pain Orientation  Right    Pain Descriptors / Indicators  Tightness    Pain Type  Acute pain;Surgical pain    Aggravating Factors   bending knee     Multiple Pain Sites  No         [facility] PT Assessment - 11/06/17 1002      Assessment   Medical Diagnosis  R ACL and MPFL reconstruction    Referring Provider (PT)  Ramond Marrow, MD    Hand Dominance  Right    Next MD Visit  ~ 11.4.19    Prior Therapy  Yes                   [facility] Adult PT Treatment/Exercise -  11/06/17 0948      Knee/Hip Exercises: Stretches   Passive Hamstring Stretch  Right;1 rep;30 seconds    Passive Hamstring Stretch Limitations  supine strap      Knee/Hip Exercises: Aerobic   Nustep  L1 x 7 min UE/LE within very limited ROM to tolerance, for PROM only      Knee/Hip Exercises: Standing   Heel Raises  Both;15 reps;Right    Heel Raises Limitations  B con/R ecc     Other Standing Knee Exercises  standing weight shifts 15 x 5" at chair    x sets; 2nd set on airex pad      Knee/Hip Exercises: Supine   Straight Leg Raises  Strengthening;Left;1 set;Limitations;15 reps;2 sets    Straight Leg Raises Limitations  mild quad lag; knee locked in extension brace      Knee/Hip Exercises: Sidelying   Hip ABduction  Right;15 reps;Strengthening;2 sets      Knee/Hip Exercises: Prone   Hip Extension  Right;15 reps;2 sets      Vasopneumatic   Number Minutes Vasopneumatic   15 minutes     Vasopnuematic Location   Knee    Vasopneumatic Pressure  Medium    Vasopneumatic Temperature   Coldest temp             PT Education - 11/06/17 1128    Education Details  HEP update    Person(s) Educated  Patient    Methods  Explanation;Demonstration;Verbal cues;Handout    Comprehension  Verbalized understanding;Returned demonstration;Verbal cues required;Need further instruction       PT Short Term Goals - 11/03/17 0951      PT SHORT TERM GOAL #1   Title  Patient independent with initial HEP.    Time  4    Period  Weeks    Status  Achieved        PT Long Term Goals - 10/23/17 1145      PT LONG TERM GOAL #1   Title  Patient to be independnet with advanced HEP.    Time  8    Period  Weeks    Status  On-going      PT LONG TERM GOAL #2   Title  Patient to demonstrate R knee AROM/PROM 0-120 without pain limiting.    Time  8    Period  Weeks    Status  On-going      PT LONG TERM GOAL #3   Title  Patient to demonstrate B LE strength >=4+/5.    Time  8    Period  Weeks    Status  On-going      PT LONG TERM GOAL #4   Title  Patient to demonstrate equal step length, weight shift, and knee flexion throughout ambulation with LRAD.    Time  8    Period  Weeks    Status  On-going      PT LONG TERM GOAL #5   Title  Patient to demonstrate SLR withotu evidence of quad lag.     Time  8    Period  Weeks    Status  On-going            Plan - 11/06/17 1610    Clinical Impression Statement  Carol Pugh doing well today noting some R knee soreness yesterday following prolonged time running errands which subsided this morning.  Tolerated progression to standing wt. shift on foam, and advancement of heel raise well today.  Initiated prone hip extension SLR  today with HEP updated.  Ended visit with ice/compression machine to R knee to decrease post-exercise swelling and pain.     PT Treatment/Interventions  ADLs/Self Care Home Management;Cryotherapy;Electrical  Stimulation;Iontophoresis 4mg /ml Dexamethasone;Moist Heat;Ultrasound;Gait training;Stair training;Functional mobility training;DME Instruction;Therapeutic activities;Therapeutic exercise;Manual techniques;Patient/family education;Neuromuscular re-education;Balance training;Passive range of motion;Dry needling;Energy conservation;Scar mobilization;Splinting;Taping;Vasopneumatic Device    Consulted and Agree with Plan of Care  Patient       Patient will benefit from skilled therapeutic intervention in order to improve the following deficits and impairments:  Increased edema, Decreased activity tolerance, Decreased strength, Pain, Decreased mobility, Difficulty walking, Decreased range of motion, Impaired flexibility, Hypermobility, Decreased scar mobility  Visit Diagnosis: Acute pain of right knee  Stiffness of right knee, not elsewhere classified  Muscle weakness (generalized)  Difficulty in walking, not elsewhere classified     Problem List Patient Active Problem List   Diagnosis Date Noted  . Kidney stone 10/09/2017  . Preoperative cardiovascular examination 10/09/2017  . Essential hypertension 06/09/2017  . History of kidney stones 04/08/2017  . Family history of Marfan syndrome 04/08/2017  . Migraine 04/08/2017    Kermit Balo, PTA 11/06/17 11:32 AM   [facility] [address]  [address] [place], [place], [address] Phone: [phone]   Fax:  [phone]  Name: Carol Pugh MRN: [id number] Date of Birth: [birth year]

## 2017-11-10 ENCOUNTER — Encounter: Payer: No Typology Code available for payment source | Admitting: Physical Therapy

## 2017-11-13 ENCOUNTER — Ambulatory Visit: Payer: No Typology Code available for payment source

## 2017-11-13 ENCOUNTER — Ambulatory Visit: Payer: No Typology Code available for payment source | Attending: Orthopaedic Surgery

## 2017-11-13 DIAGNOSIS — M25661 Stiffness of right knee, not elsewhere classified: Secondary | ICD-10-CM | POA: Insufficient documentation

## 2017-11-13 DIAGNOSIS — M6281 Muscle weakness (generalized): Secondary | ICD-10-CM | POA: Diagnosis present

## 2017-11-13 DIAGNOSIS — R262 Difficulty in walking, not elsewhere classified: Secondary | ICD-10-CM | POA: Insufficient documentation

## 2017-11-13 DIAGNOSIS — M25561 Pain in right knee: Secondary | ICD-10-CM | POA: Insufficient documentation

## 2017-11-13 NOTE — Therapy (Signed)
[facility] [address]  [place], [place], [address] Phone: [phone]   Fax:  [phone]  Physical Therapy Treatment  Patient Details  Name: Carol Pugh MRN: [id number] Date of Birth: [birth year] Referring Provider (PT): Ophelia Charter, MD   Encounter Date: 11/13/2017  PT End of Session - 11/13/17 0935    Visit Number  12    Number of Visits  23    Date for PT Re-Evaluation  12/16/17    Authorization Type  [facility]    PT Start Time  0930    PT Stop Time  1025    PT Time Calculation (min)  55 min    Activity Tolerance  Patient tolerated treatment well    Behavior During Therapy  [facility] for tasks assessed/performed       Past Medical History:  Diagnosis Date  . Anxiety   . Depression   . Distal renal tubular acidosis   . Family history of Marfan syndrome   . Headache   . Heart murmur   . History of kidney stones   . Hypertension   . Kidney stones   . Migraine   . PCOS (polycystic ovarian syndrome)     Past Surgical History:  Procedure Laterality Date  . EXTRACORPOREAL SHOCK WAVE LITHOTRIPSY Left 06/11/2017   Procedure: LEFT EXTRACORPOREAL SHOCK WAVE LITHOTRIPSY (ESWL);  Surgeon: Raynelle Bring, MD;  [facility];  Service: Urology;  Laterality: Left;  . KNEE ARTHROSCOPY WITH ANTERIOR CRUCIATE LIGAMENT (ACL) REPAIR WITH HAMSTRING GRAFT Right 10/19/2017   Procedure: RIGHT KNEE ARTHROSCOPY WITH ANTERIOR CRUCIATE LIGAMENT (ACL) REPAIR WITH AUTOGRAFT HAMSTRING;  Surgeon: Hiram Gash, MD;  Location: [place];  Service: Orthopedics;  Laterality: Right;  . MEDIAL PATELLOFEMORAL LIGAMENT REPAIR Right 10/19/2017   Procedure: MEDIAL PATELLA FEMORAL LIGAMENT RECONSTRUCTION RIGHT KNEE;  Surgeon: Hiram Gash, MD;  Location: [place];  Service: Orthopedics;  Laterality: Right;  . urinary stent placements      There were no vitals filed for this visit.  Subjective Assessment -  11/13/17 0935    Subjective  Pt. reporting no new issues.  Doing HEP consistently per report.      Pertinent History  distal renal tibular acidosis, family hx of Marfan syndrome, kidney stones, HTN, migraine    Diagnostic tests  per patient MRI R knee 09/08/16: showed patellofemoral lig tear and MCL sprain and ACL rupture     Patient Stated Goals  be able to walk right; get back to work    Currently in Pain?  No/denies    Pain Score  0-No pain    Multiple Pain Sites  No         [facility] PT Assessment - 11/13/17 0945      PROM   Right/Left Knee  Right    Right Knee Extension  2    Right Knee Flexion  61   with heels on peanut p-ball and brace on                   [facility] Adult PT Treatment/Exercise - 11/13/17 0947      Knee/Hip Exercises: Stretches   Passive Hamstring Stretch  Right;1 rep;30 seconds    Passive Hamstring Stretch Limitations  supine strap    Hip Flexor Stretch  Right;60 seconds;2 reps    Hip Flexor Stretch Limitations  mod thomas R LE resting on 4" step     ITB Stretch  Right;1  rep;30 seconds    ITB Stretch Limitations  strap     Gastroc Stretch  Right;2 reps;30 seconds    Gastroc Stretch Limitations  strap longsitting       Knee/Hip Exercises: Aerobic   Nustep  --   Warmed up with supine flexion with heels on peanut p-ball      Knee/Hip Exercises: Standing   Heel Raises  Both;15 reps;Right    Heel Raises Limitations  B con/R ecc     Other Standing Knee Exercises  Standing R/L weight shift on airex pad x 15 reps; standing on airex with eyes closed and therapist perturbations x 30 sec       Knee/Hip Exercises: Supine   Straight Leg Raises  Strengthening;Left;Limitations;15 reps;2 sets    Straight Leg Raises Limitations  nearly non existent quad lag; knee locked in extension brace      Knee/Hip Exercises: Sidelying   Hip ABduction  Right;15 reps;Strengthening;2 sets    Hip ABduction Limitations  1#      Knee/Hip Exercises: Prone   Hip Extension   Right;10 reps    Hip Extension Limitations  1#      Vasopneumatic   Number Minutes Vasopneumatic   10 minutes    Vasopnuematic Location   Knee   R   Vasopneumatic Pressure  Medium    Vasopneumatic Temperature   Coldest temp      Manual Therapy   Manual Therapy  Joint mobilization    Joint Mobilization  R patellar mobilizations grade III in inferior, superior, medial directions only to tolerance               PT Short Term Goals - 11/03/17 0951      PT SHORT TERM GOAL #1   Title  Patient independent with initial HEP.    Time  4    Period  Weeks    Status  Achieved        PT Long Term Goals - 11/13/17 7681      PT LONG TERM GOAL #1   Title  Patient to be independnet with advanced HEP.    Time  8    Period  Weeks    Status  Partially Met      PT LONG TERM GOAL #2   Title  Patient to demonstrate R knee AROM/PROM 0-120 without pain limiting.    Time  8    Period  Weeks    Status  On-going      PT LONG TERM GOAL #3   Title  Patient to demonstrate B LE strength >=4+/5.    Time  8    Period  Weeks    Status  On-going      PT LONG TERM GOAL #4   Title  Patient to demonstrate equal step length, weight shift, and knee flexion throughout ambulation with LRAD.    Time  8    Period  Weeks    Status  On-going      PT LONG TERM GOAL #5   Title  Patient to demonstrate SLR withotu evidence of quad lag.     Time  8    Period  Weeks    Status  On-going            Plan - 11/13/17 1572    Clinical Impression Statement  Pt. reporting she has been ambulating around house with one axillary crutch and now feels more stable.  Pt. demonstrating improved R quad strength today with only  minimal quad lag evident with SLR.  Tolerated addition of eyes closed proprioception activities without issue.  Able to demo improved flexion ROM to 61 dg today with brace on.  Ended visit with ice/compression to knee to decrease swelling and pain.      PT Treatment/Interventions   ADLs/Self Care Home Management;Cryotherapy;Electrical Stimulation;Iontophoresis 25m/ml Dexamethasone;Moist Heat;Ultrasound;Gait training;Stair training;Functional mobility training;DME Instruction;Therapeutic activities;Therapeutic exercise;Manual techniques;Patient/family education;Neuromuscular re-education;Balance training;Passive range of motion;Dry needling;Energy conservation;Scar mobilization;Splinting;Taping;Vasopneumatic Device    Consulted and Agree with Plan of Care  Patient       Patient will benefit from skilled therapeutic intervention in order to improve the following deficits and impairments:  Increased edema, Decreased activity tolerance, Decreased strength, Pain, Decreased mobility, Difficulty walking, Decreased range of motion, Impaired flexibility, Hypermobility, Decreased scar mobility  Visit Diagnosis: Acute pain of right knee  Stiffness of right knee, not elsewhere classified  Muscle weakness (generalized)  Difficulty in walking, not elsewhere classified     Problem List Patient Active Problem List   Diagnosis Date Noted  . Kidney stone 10/09/2017  . Preoperative cardiovascular examination 10/09/2017  . Essential hypertension 06/09/2017  . History of kidney stones 04/08/2017  . Family history of Marfan syndrome 04/08/2017  . Migraine 04/08/2017    MBess Harvest PTA 11/13/17 12:43 PM    [facility] [address] [place] [address]Phone: [phone]  Fax:  [phone] Name: Carol MAKARMRN: [id number] of Birth: 5February 13, 1997

## 2017-11-17 ENCOUNTER — Ambulatory Visit: Payer: No Typology Code available for payment source

## 2017-11-17 MED FILL — SUMATRIPTAN SUCC 100 MG TAB: 100 | 30 days supply | Qty: 8 | Fill #2

## 2017-11-20 ENCOUNTER — Encounter: Payer: Self-pay | Admitting: Physical Therapy

## 2017-11-20 ENCOUNTER — Ambulatory Visit: Payer: No Typology Code available for payment source | Admitting: Physical Therapy

## 2017-11-20 DIAGNOSIS — M25561 Pain in right knee: Secondary | ICD-10-CM | POA: Diagnosis not present

## 2017-11-20 DIAGNOSIS — M6281 Muscle weakness (generalized): Secondary | ICD-10-CM

## 2017-11-20 DIAGNOSIS — M25661 Stiffness of right knee, not elsewhere classified: Secondary | ICD-10-CM

## 2017-11-20 DIAGNOSIS — R262 Difficulty in walking, not elsewhere classified: Secondary | ICD-10-CM

## 2017-11-20 NOTE — Therapy (Signed)
[facility] [address]  [place], [place], [address] Phone: [phone]   Fax:  [phone]  Physical Therapy Treatment  Patient Details  Name: Carol Pugh MRN: [id number] Date of Birth: February 09, 1995 Referring Provider (PT): Ophelia Charter, MD   Encounter Date: 11/20/2017  PT End of Session - 11/20/17 1009    Visit Number  13    Number of Visits  23    Date for PT Re-Evaluation  12/16/17    Authorization Type  [facility]    PT Start Time  0929    PT Stop Time  1019    PT Time Calculation (min)  50 min    Activity Tolerance  Patient tolerated treatment well    Behavior During Therapy  [facility] for tasks assessed/performed       Past Medical History:  Diagnosis Date  . Anxiety   . Depression   . Distal renal tubular acidosis   . Family history of Marfan syndrome   . Headache   . Heart murmur   . History of kidney stones   . Hypertension   . Kidney stones   . Migraine   . PCOS (polycystic ovarian syndrome)     Past Surgical History:  Procedure Laterality Date  . EXTRACORPOREAL SHOCK WAVE LITHOTRIPSY Left 06/11/2017   Procedure: LEFT EXTRACORPOREAL SHOCK WAVE LITHOTRIPSY (ESWL);  Surgeon: Raynelle Bring, MD;  [facility];  Service: Urology;  Laterality: Left;  . KNEE ARTHROSCOPY WITH ANTERIOR CRUCIATE LIGAMENT (ACL) REPAIR WITH HAMSTRING GRAFT Right 10/19/2017   Procedure: RIGHT KNEE ARTHROSCOPY WITH ANTERIOR CRUCIATE LIGAMENT (ACL) REPAIR WITH AUTOGRAFT HAMSTRING;  Surgeon: Hiram Gash, MD;  Location: [place];  Service: Orthopedics;  Laterality: Right;  . MEDIAL PATELLOFEMORAL LIGAMENT REPAIR Right 10/19/2017   Procedure: MEDIAL PATELLA FEMORAL LIGAMENT RECONSTRUCTION RIGHT KNEE;  Surgeon: Hiram Gash, MD;  Location: [place];  Service: Orthopedics;  Laterality: Right;  . urinary stent placements      There were no vitals filed for this visit.  Subjective Assessment -  11/20/17 0930    Subjective  Reports on Tuesday she saw MD who took off brace. Patient now walking without crutches or brace. Reports she feels like her knee is bending better and feeling good overall. Reports MD mentioned possibility of getting an injection to improve stiffness.     Pertinent History  distal renal tibular acidosis, family hx of Marfan syndrome, kidney stones, HTN, migraine    Diagnostic tests  per patient MRI R knee 09/08/16: showed patellofemoral lig tear and MCL sprain and ACL rupture     Patient Stated Goals  be able to walk right; get back to work    Currently in Pain?  No/denies         [facility] PT Assessment - 11/20/17 0001      PROM   Right/Left Knee  Right    Right Knee Flexion  76   AAROM heel slide                  [facility] Adult PT Treatment/Exercise - 11/20/17 0001      Exercises   Exercises  Knee/Hip      Knee/Hip Exercises: Aerobic   Nustep  L1 x 6 min UE/LE to tol      Knee/Hip Exercises: Standing   Hip Flexion  Stengthening;Right;Left;1 set;10 reps;Knee straight;Limitations    Hip Flexion Limitations  with red TB.; cues for upright trunk  Terminal Knee Extension  Strengthening;Right;1 set;10 reps;Theraband;Limitations    Theraband Level (Terminal Knee Extension)  Level 3 (Green)    Terminal Knee Extension Limitations  10x3" with chair for safety    Hip ADduction  Strengthening;Right;Left;1 set;10 reps;Limitations    Hip ADduction Limitations  with red TB.; cues for upright trunk    Hip Abduction  Stengthening;Right;Left;1 set;Knee straight;10 reps;Limitations    Abduction Limitations  with red TB.; cues for upright trunk    Hip Extension  Stengthening;Right;Left;1 set;10 reps;Knee straight;Limitations    Extension Limitations  with red TB.; cues for upright trunk    Functional Squat  1 set;10 reps      Knee/Hip Exercises: Supine   Heel Slides  Right;1 set;AAROM;Limitations;10 reps    Heel Slides Limitations  10x3" orange pball with  strap to tol      Vasopneumatic   Number Minutes Vasopneumatic   15 minutes    Vasopnuematic Location   Knee   R   Vasopneumatic Pressure  Medium    Vasopneumatic Temperature   Coldest temp             PT Education - 11/20/17 1008    Education Details  update to HEP; administered green TB    Person(s) Educated  Patient    Methods  Explanation;Demonstration;Tactile cues;Verbal cues;Handout    Comprehension  Verbalized understanding;Returned demonstration       PT Short Term Goals - 11/03/17 0951      PT SHORT TERM GOAL #1   Title  Patient independent with initial HEP.    Time  4    Period  Weeks    Status  Achieved        PT Long Term Goals - 11/13/17 5929      PT LONG TERM GOAL #1   Title  Patient to be independnet with advanced HEP.    Time  8    Period  Weeks    Status  Partially Met      PT LONG TERM GOAL #2   Title  Patient to demonstrate R knee AROM/PROM 0-120 without pain limiting.    Time  8    Period  Weeks    Status  On-going      PT LONG TERM GOAL #3   Title  Patient to demonstrate B LE strength >=4+/5.    Time  8    Period  Weeks    Status  On-going      PT LONG TERM GOAL #4   Title  Patient to demonstrate equal step length, weight shift, and knee flexion throughout ambulation with LRAD.    Time  8    Period  Weeks    Status  On-going      PT LONG TERM GOAL #5   Title  Patient to demonstrate SLR withotu evidence of quad lag.     Time  8    Period  Weeks    Status  On-going            Plan - 11/20/17 1009    Clinical Impression Statement  Patient arrived today without crutches or knee brace. Reports she was MD on Tuesday who released her from both. Patient now reporting improvement in R knee pain and ROM. Introduced several new exercises today with great tolerance by patient. Able to perform B LE 4 way hip with intermittent cues to maintain upright trunk. No complaints with TKE today. Worked on squats to tolerance- limited range  performed d/t tightness  and decreased ROM. Updated HEP to include exercises that were well tolerated today; advised patient to use chair for safety. Patient reported understanding. Measured R knee AAROM during last rep of heel slide at end of ther-ex with patient reaching 76 degrees today. Ended session with [employer] to R knee for edema. No complaints at end of session.     PT Treatment/Interventions  ADLs/Self Care Home Management;Cryotherapy;Electrical Stimulation;Iontophoresis 29m/ml Dexamethasone;Moist Heat;Ultrasound;Gait training;Stair training;Functional mobility training;DME Instruction;Therapeutic activities;Therapeutic exercise;Manual techniques;Patient/family education;Neuromuscular re-education;Balance training;Passive range of motion;Dry needling;Energy conservation;Scar mobilization;Splinting;Taping;Vasopneumatic Device    Consulted and Agree with Plan of Care  Patient       Patient will benefit from skilled therapeutic intervention in order to improve the following deficits and impairments:  Increased edema, Decreased activity tolerance, Decreased strength, Pain, Decreased mobility, Difficulty walking, Decreased range of motion, Impaired flexibility, Hypermobility, Decreased scar mobility  Visit Diagnosis: Acute pain of right knee  Stiffness of right knee, not elsewhere classified  Muscle weakness (generalized)  Difficulty in walking, not elsewhere classified     Problem List Patient Active Problem List   Diagnosis Date Noted  . Kidney stone 10/09/2017  . Preoperative cardiovascular examination 10/09/2017  . Essential hypertension 06/09/2017  . History of kidney stones 04/08/2017  . Family history of Marfan syndrome 04/08/2017  . Migraine 04/08/2017    YJanene Harvey PT, DPT 11/20/17 10:21 AM   [facility] [place] [address]Phone: [phone]  Fax:   [phone] Name: Carol KEAGYMRN: [id number] of Birth: 506-Mar-1997

## 2017-11-23 MED FILL — CLOMIPHENE CITRATE 50 MG TA: 50 | 5 days supply | Qty: 5 | Fill #2

## 2017-11-27 ENCOUNTER — Encounter: Payer: Self-pay | Admitting: Physical Therapy

## 2017-11-27 ENCOUNTER — Ambulatory Visit: Payer: No Typology Code available for payment source | Admitting: Physical Therapy

## 2017-11-27 DIAGNOSIS — M25561 Pain in right knee: Secondary | ICD-10-CM

## 2017-11-27 DIAGNOSIS — M25661 Stiffness of right knee, not elsewhere classified: Secondary | ICD-10-CM

## 2017-11-27 DIAGNOSIS — M6281 Muscle weakness (generalized): Secondary | ICD-10-CM

## 2017-11-27 DIAGNOSIS — R262 Difficulty in walking, not elsewhere classified: Secondary | ICD-10-CM

## 2017-11-27 NOTE — Therapy (Signed)
[facility] [address]  [place], [place], [address] Phone: [phone]   Fax:  [phone]  Physical Therapy Treatment  Patient Details  Name: Carol Pugh MRN: [id number] Date of Birth: 01/30/1995 Referring Provider (PT): Ophelia Charter, MD   Encounter Date: 11/27/2017  PT End of Session - 11/27/17 0943    Visit Number  14    Number of Visits  23    Date for PT Re-Evaluation  12/16/17    Authorization Type  [facility]    PT Start Time  0850    PT Stop Time  0946    PT Time Calculation (min)  56 min    Equipment Utilized During Treatment  Gait belt    Activity Tolerance  Patient tolerated treatment well    Behavior During Therapy  [facility] for tasks assessed/performed       Past Medical History:  Diagnosis Date  . Anxiety   . Depression   . Distal renal tubular acidosis   . Family history of Marfan syndrome   . Headache   . Heart murmur   . History of kidney stones   . Hypertension   . Kidney stones   . Migraine   . PCOS (polycystic ovarian syndrome)     Past Surgical History:  Procedure Laterality Date  . EXTRACORPOREAL SHOCK WAVE LITHOTRIPSY Left 06/11/2017   Procedure: LEFT EXTRACORPOREAL SHOCK WAVE LITHOTRIPSY (ESWL);  Surgeon: Raynelle Bring, MD;  Location: [facility];  Service: Urology;  Laterality: Left;  . KNEE ARTHROSCOPY WITH ANTERIOR CRUCIATE LIGAMENT (ACL) REPAIR WITH HAMSTRING GRAFT Right 10/19/2017   Procedure: RIGHT KNEE ARTHROSCOPY WITH ANTERIOR CRUCIATE LIGAMENT (ACL) REPAIR WITH AUTOGRAFT HAMSTRING;  Surgeon: Hiram Gash, MD;  Location: [place];  Service: Orthopedics;  Laterality: Right;  . MEDIAL PATELLOFEMORAL LIGAMENT REPAIR Right 10/19/2017   Procedure: MEDIAL PATELLA FEMORAL LIGAMENT RECONSTRUCTION RIGHT KNEE;  Surgeon: Hiram Gash, MD;  Location: [place];  Service: Orthopedics;  Laterality: Right;  . urinary stent placements      There were no vitals  filed for this visit.  Subjective Assessment - 11/27/17 0850    Subjective  Reports MD advised her to keep out of work until Dec 2nd. Patient concerned about the stiffness in R knee and worried about returning back to work as she does not believe she can walk all day for 12 hours. 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 Sees MD for follow up 12/01/17.    Pertinent History  distal renal tibular acidosis, family hx of Marfan syndrome, kidney stones, HTN, migraine    Diagnostic tests  per patient MRI R knee 09/08/16: showed patellofemoral lig tear and MCL sprain and ACL rupture     Patient Stated Goals  be able to walk right; get back to work    Currently in Pain?  No/denies         [facility] PT Assessment - 11/27/17 0001      AROM   Right/Left Knee  Right    Right Knee Extension  0    Right Knee Flexion  79      PROM   Right/Left Knee  Right    Right Knee Extension  0    Right Knee Flexion  85   AAROM with heel slide  Strength   Right/Left Hip  Right;Left    Right Hip Flexion  4/5    Right Hip ABduction  4/5    Right Hip ADduction  4/5    Left Hip Flexion  4+/5    Left Hip ABduction  4+/5    Left Hip ADduction  4+/5    Right/Left Knee  Right;Left    Right Knee Flexion  --   NT d/t precautions   Right Knee Extension  --   NT d/t precautions   Left Knee Flexion  4+/5    Left Knee Extension  4+/5    Right/Left Ankle  Right;Left    Right Ankle Dorsiflexion  4+/5    Right Ankle Plantar Flexion  4+/5    Left Ankle Dorsiflexion  4+/5    Left Ankle Plantar Flexion  4+/5                   [facility] Adult PT Treatment/Exercise - 11/27/17 0001      Exercises   Exercises  Knee/Hip      Knee/Hip Exercises: Aerobic   Nustep  L1 x 6 min UE/LE to tol      Knee/Hip Exercises: Seated   Other Seated Knee/Hip Exercises  R knee flexion stretch 5x5" to  tol    Hamstring Curl  Strengthening;Right;1 set;10 reps;Limitations   cues to avoid active extension   Hamstring Limitations  blue TB    Sit to Sand  2 sets;5 reps;without UE support   1 foam pad under bottom; cues for R wt shift and glute activ     Knee/Hip Exercises: Supine   Heel Slides  Right;1 set;AAROM;Limitations;10 reps    Heel Slides Limitations  10x5" orange pball with strap to tol    Straight Leg Raises  Strengthening;Left;Limitations;15 reps;2 sets    Straight Leg Raises Limitations  2nd set with 1#; mild quad lag without brace      Vasopneumatic   Number Minutes Vasopneumatic   10 minutes    Vasopnuematic Location   Knee   R   Vasopneumatic Pressure  Medium    Vasopneumatic Temperature   Coldest temp             PT Education - 11/27/17 0940    Education Details  update to HEP; administered blue TB; discussion with patient about physical work requirements and areas that require further progress in order for return to work    [employer]) Educated  Patient    Methods  Explanation;Demonstration;Tactile cues;Verbal cues;Handout    Comprehension  Verbalized understanding;Returned demonstration       PT Short Term Goals - 11/27/17 0905      PT SHORT TERM GOAL #1   Title  Patient independent with initial HEP.    Time  4    Period  Weeks    Status  Achieved        PT Long Term Goals - 11/27/17 5732      PT LONG TERM GOAL #1   Title  Patient to be independnet with advanced HEP.    Time  8    Period  Weeks    Status  Partially Met   met for current     PT LONG TERM GOAL #2   Title  Patient to demonstrate R knee AROM/PROM 0-120 without pain limiting.    Time  8    Period  Weeks    Status  On-going   R knee AROM 0-79 deg, AAROM 0-85 deg  PT LONG TERM GOAL #3   Title  Patient to demonstrate B LE strength >=4+/5.    Time  8    Period  Weeks    Status  On-going   improvements demonstrated in R hip flexion, abduction, adduction, L knee flexion and  extension, and R ankle PF strength     PT LONG TERM GOAL #4   Title  Patient to demonstrate equal step length, weight shift, and knee flexion throughout ambulation with LRAD.    Time  8    Period  Weeks    Status  On-going   still demonstrates anterior and lateral trunk lean d/t limited R knee flexion with ambulation     PT LONG TERM GOAL #5   Title  Patient to demonstrate SLR withotu evidence of quad lag.     Time  8    Period  Weeks    Status  On-going   mild quad lag evident without brace           Plan - 11/27/17 0950    Clinical Impression Statement  Patient arrived to session with no new complaints. 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 Patient does believe she should be ready to return to work by Dec 2nd. Updated goals- patient able to reach R knee AROM 0-79 deg, AAROM 0-85 deg today. Improvements demonstrated in R hip flexion, abduction, adduction, L knee flexion and extension, and R ankle PF strength. Worked on SLR with and without 1lb weight- mild quad lag evident without brace or weight. Patient also still demonstrates anterior and lateral trunk lean d/t limited R knee flexion with ambulation. Spoke with patient about physical work requirements and areas that require further progress in order for return to work, and addressed these deficits today. Introduced STS with 1 foam pad without UE support. Patient requiring cues for technique which improved with each subsequent rep. Manual cues provided to promote R weight shift as patient is L LE dominant. Ended session with [place] to R knee to decrease edema. Patient noting relief at end of session and reporting no pain. Patient showing steady progress with PT, plan to progress per POC.     PT Treatment/Interventions  ADLs/Self Care Home  Management;Cryotherapy;Electrical Stimulation;Iontophoresis 27m/ml Dexamethasone;Moist Heat;Ultrasound;Gait training;Stair training;Functional mobility training;DME Instruction;Therapeutic activities;Therapeutic exercise;Manual techniques;Patient/family education;Neuromuscular re-education;Balance training;Passive range of motion;Dry needling;Energy conservation;Scar mobilization;Splinting;Taping;Vasopneumatic Device    PT Next Visit Plan  STS without UE support, R knee flexion ROM    Consulted and Agree with Plan of Care  Patient       Patient will benefit from skilled therapeutic intervention in order to improve the following deficits and impairments:  Increased edema, Decreased activity tolerance, Decreased strength, Pain, Decreased mobility, Difficulty walking, Decreased range of motion, Impaired flexibility, Hypermobility, Decreased scar mobility  Visit Diagnosis: Acute pain of right knee  Stiffness of right knee, not elsewhere classified  Muscle weakness (generalized)  Difficulty in walking, not elsewhere classified     Problem List Patient Active Problem List   Diagnosis Date Noted  . Kidney stone 10/09/2017  . Preoperative cardiovascular examination 10/09/2017  . Essential hypertension 06/09/2017  . History of kidney stones 04/08/2017  . Family history of Marfan syndrome 04/08/2017  . Migraine 04/08/2017     YJanene Harvey PT, DPT 11/27/17 9:58 AM   [place]  [facility] [address]  [place], [place], [address] Phone: [phone]   Fax:  [phone]  Name: SAVONNA BIRCHMEIER MRN: [id number] Date of Birth: 1995/09/20

## 2017-12-01 ENCOUNTER — Ambulatory Visit: Payer: No Typology Code available for payment source

## 2017-12-01 DIAGNOSIS — M6281 Muscle weakness (generalized): Secondary | ICD-10-CM

## 2017-12-01 DIAGNOSIS — M25561 Pain in right knee: Secondary | ICD-10-CM | POA: Diagnosis not present

## 2017-12-01 DIAGNOSIS — R262 Difficulty in walking, not elsewhere classified: Secondary | ICD-10-CM

## 2017-12-01 DIAGNOSIS — M25661 Stiffness of right knee, not elsewhere classified: Secondary | ICD-10-CM

## 2017-12-01 NOTE — Therapy (Signed)
[facility] [address]  [place], [place], [address] Phone: [phone]   Fax:  [phone]  Physical Therapy Treatment  Patient Details  Name: Carol Pugh MRN: [id number] Date of Birth: 12-Dec-1995 Referring Provider (PT): Ophelia Charter, MD   Encounter Date: 12/01/2017  PT End of Session - 12/01/17 1000    Visit Number  15    Number of Visits  23    Date for PT Re-Evaluation  12/16/17    Authorization Type  [facility]    PT Start Time  0928    PT Stop Time  1023    PT Time Calculation (min)  55 min    Equipment Utilized During Treatment  Gait belt    Activity Tolerance  Patient tolerated treatment well    Behavior During Therapy  [facility] for tasks assessed/performed       Past Medical History:  Diagnosis Date  . Anxiety   . Depression   . Distal renal tubular acidosis   . Family history of Marfan syndrome   . Headache   . Heart murmur   . History of kidney stones   . Hypertension   . Kidney stones   . Migraine   . PCOS (polycystic ovarian syndrome)     Past Surgical History:  Procedure Laterality Date  . EXTRACORPOREAL SHOCK WAVE LITHOTRIPSY Left 06/11/2017   Procedure: LEFT EXTRACORPOREAL SHOCK WAVE LITHOTRIPSY (ESWL);  Surgeon: Raynelle Bring, MD;  [facility];  Service: Urology;  Laterality: Left;  . KNEE ARTHROSCOPY WITH ANTERIOR CRUCIATE LIGAMENT (ACL) REPAIR WITH HAMSTRING GRAFT Right 10/19/2017   Procedure: RIGHT KNEE ARTHROSCOPY WITH ANTERIOR CRUCIATE LIGAMENT (ACL) REPAIR WITH AUTOGRAFT HAMSTRING;  Surgeon: Hiram Gash, MD;  Location: [place];  Service: Orthopedics;  Laterality: Right;  . MEDIAL PATELLOFEMORAL LIGAMENT REPAIR Right 10/19/2017   Procedure: MEDIAL PATELLA FEMORAL LIGAMENT RECONSTRUCTION RIGHT KNEE;  Surgeon: Hiram Gash, MD;  Location: [place];  Service: Orthopedics;  Laterality: Right;  . urinary stent placements      There were no vitals  filed for this visit.  Subjective Assessment - 12/01/17 0929    Subjective  Pt. noting some of HEP activities getting easy.      Pertinent History  distal renal tibular acidosis, family hx of Marfan syndrome, kidney stones, HTN, migraine    How long can you sit comfortably?  unlimited     How long can you stand comfortably?  45 min    How long can you walk comfortably?  2 hours     Diagnostic tests  per patient MRI R knee 09/08/16: showed patellofemoral lig tear and MCL sprain and ACL rupture     Patient Stated Goals  be able to walk right; get back to work    Currently in Pain?  No/denies    Pain Score  0-No pain    Multiple Pain Sites  No         [facility] PT Assessment - 12/01/17 1004      AROM   Right/Left Knee  Right    Right Knee Extension  0    Right Knee Flexion  83      PROM   Right/Left Knee  Right    Right Knee Extension  0    Right Knee Flexion  87                   [facility] Adult PT Treatment/Exercise - 12/01/17  0946      Knee/Hip Exercises: Stretches   Sports administrator  Right;1 rep;60 seconds;2 reps    Hip Flexor Stretch  Right;60 seconds;2 reps    Hip Flexor Stretch Limitations  mod thomas R LE resting on 4" step       Knee/Hip Exercises: Standing   Hip Flexion  Stengthening;Right;Left;1 set;10 reps;Knee straight;Limitations    Hip Flexion Limitations  with blue TB; cues for upright trunk    Lateral Step Up  Right;15 reps;Step Height: 6";Hand Hold: 2    Lateral Step Up Limitations  cues to control eccentric     Forward Step Up  Right;15 reps;Step Height: 6";Hand Hold: 1    Forward Step Up Limitations  Cues for eccentric control     Wall Squat  15 reps;3 seconds    Wall Squat Limitations  to 70 dg as pt. lacking control beyond this       Knee/Hip Exercises: Seated   Sit to Sand  2 sets;5 reps;without UE support   sitting on aire pad on table     Vasopneumatic   Number Minutes Vasopneumatic   10 minutes    Vasopnuematic Location   Knee     Vasopneumatic Pressure  Medium    Vasopneumatic Temperature   Coldest temp      Manual Therapy   Manual Therapy  Joint mobilization    Manual therapy comments  supine     Joint Mobilization  R patellar mobilizations grade III in inferior, superior, medial directions only to tolerance             PT Education - 12/01/17 1301    Education Details  HEp update     Person(s) Educated  Patient    Methods  Explanation;Demonstration;Verbal cues;Handout    Comprehension  Verbalized understanding;Returned demonstration;Verbal cues required;Need further instruction       PT Short Term Goals - 11/27/17 0905      PT SHORT TERM GOAL #1   Title  Patient independent with initial HEP.    Time  4    Period  Weeks    Status  Achieved        PT Long Term Goals - 11/27/17 1102      PT LONG TERM GOAL #1   Title  Patient to be independnet with advanced HEP.    Time  8    Period  Weeks    Status  Partially Met   met for current     PT LONG TERM GOAL #2   Title  Patient to demonstrate R knee AROM/PROM 0-120 without pain limiting.    Time  8    Period  Weeks    Status  On-going   R knee AROM 0-79 deg, AAROM 0-85 deg     PT LONG TERM GOAL #3   Title  Patient to demonstrate B LE strength >=4+/5.    Time  8    Period  Weeks    Status  On-going   improvements demonstrated in R hip flexion, abduction, adduction, L knee flexion and extension, and R ankle PF strength     PT LONG TERM GOAL #4   Title  Patient to demonstrate equal step length, weight shift, and knee flexion throughout ambulation with LRAD.    Time  8    Period  Weeks    Status  On-going   still demonstrates anterior and lateral trunk lean d/t limited R knee flexion with ambulation     PT LONG  TERM GOAL #5   Title  Patient to demonstrate SLR withotu evidence of quad lag.     Time  8    Period  Weeks    Status  On-going   mild quad lag evident without brace           Plan - 12/01/17 1302    Clinical  Impression Statement  [name] doing well today.  Tolerated addition of 6" lateral step-up, forward step-up, and wall sit as allowed per protocol without pain.  Does still ambulate with R hip/knee flexion hesitation/lag which causes her to circumduction thus addressed isolated hip/knee flexion activities for improved activation with walking with some carryover.  Ended visit with ice/compression to knee to reduce swelling.      PT Treatment/Interventions  ADLs/Self Care Home Management;Cryotherapy;Electrical Stimulation;Iontophoresis 48m/ml Dexamethasone;Moist Heat;Ultrasound;Gait training;Stair training;Functional mobility training;DME Instruction;Therapeutic activities;Therapeutic exercise;Manual techniques;Patient/family education;Neuromuscular re-education;Balance training;Passive range of motion;Dry needling;Energy conservation;Scar mobilization;Splinting;Taping;Vasopneumatic Device    Consulted and Agree with Plan of Care  Patient       Patient will benefit from skilled therapeutic intervention in order to improve the following deficits and impairments:  Increased edema, Decreased activity tolerance, Decreased strength, Pain, Decreased mobility, Difficulty walking, Decreased range of motion, Impaired flexibility, Hypermobility, Decreased scar mobility  Visit Diagnosis: Acute pain of right knee  Stiffness of right knee, not elsewhere classified  Muscle weakness (generalized)  Difficulty in walking, not elsewhere classified     Problem List Patient Active Problem List   Diagnosis Date Noted  . Kidney stone 10/09/2017  . Preoperative cardiovascular examination 10/09/2017  . Essential hypertension 06/09/2017  . History of kidney stones 04/08/2017  . Family history of Marfan syndrome 04/08/2017  . Migraine 04/08/2017    MBess Harvest PTA 12/01/17 1:07 PM   [facility] [address] [place] [address]:  [phone]  Fax:  [phone] Name: SDAKOTAH HEIMANMRN: [id number] of Birth: 511-19-1997

## 2017-12-04 ENCOUNTER — Ambulatory Visit: Payer: No Typology Code available for payment source

## 2017-12-04 DIAGNOSIS — M6281 Muscle weakness (generalized): Secondary | ICD-10-CM

## 2017-12-04 DIAGNOSIS — M25561 Pain in right knee: Secondary | ICD-10-CM | POA: Diagnosis not present

## 2017-12-04 DIAGNOSIS — M25661 Stiffness of right knee, not elsewhere classified: Secondary | ICD-10-CM

## 2017-12-04 DIAGNOSIS — R262 Difficulty in walking, not elsewhere classified: Secondary | ICD-10-CM

## 2017-12-04 NOTE — Therapy (Signed)
[facility] [address]  [place], [place], [address] Phone: [phone]   Fax:  [phone]  Physical Therapy Treatment  Patient Details  Name: Carol Pugh MRN: [id number] Date of Birth: [birth year] Referring Provider (PT): Ophelia Charter, MD   Encounter Date: 12/04/2017  PT End of Session - 12/04/17 0932    Visit Number  16    Number of Visits  23    Date for PT Re-Evaluation  12/16/17    Authorization Type  [facility]    PT Start Time  0928    PT Stop Time  1025    PT Time Calculation (min)  57 min    Equipment Utilized During Treatment  Gait belt    Activity Tolerance  Patient tolerated treatment well    Behavior During Therapy  [facility] for tasks assessed/performed       Past Medical History:  Diagnosis Date  . Anxiety   . Depression   . Distal renal tubular acidosis   . Family history of Marfan syndrome   . Headache   . Heart murmur   . History of kidney stones   . Hypertension   . Kidney stones   . Migraine   . PCOS (polycystic ovarian syndrome)     Past Surgical History:  Procedure Laterality Date  . EXTRACORPOREAL SHOCK WAVE LITHOTRIPSY Left 06/11/2017   Procedure: LEFT EXTRACORPOREAL SHOCK WAVE LITHOTRIPSY (ESWL);  Surgeon: Raynelle Bring, MD;  Location: [facility];  Service: Urology;  Laterality: Left;  . KNEE ARTHROSCOPY WITH ANTERIOR CRUCIATE LIGAMENT (ACL) REPAIR WITH HAMSTRING GRAFT Right 10/19/2017   Procedure: RIGHT KNEE ARTHROSCOPY WITH ANTERIOR CRUCIATE LIGAMENT (ACL) REPAIR WITH AUTOGRAFT HAMSTRING;  Surgeon: Hiram Gash, MD;  Location: [place];  Service: Orthopedics;  Laterality: Right;  . MEDIAL PATELLOFEMORAL LIGAMENT REPAIR Right 10/19/2017   Procedure: MEDIAL PATELLA FEMORAL LIGAMENT RECONSTRUCTION RIGHT KNEE;  Surgeon: Hiram Gash, MD;  Location: [place];  Service: Orthopedics;  Laterality: Right;  . urinary stent placements      There were no vitals  filed for this visit.  Subjective Assessment - 12/04/17 0932    Subjective  Pt. reporting she has felt improvement in knee comfort and flexion since receiving steroid injection on Tuesday.  Back to work December 2nd.      Pertinent History  distal renal tibular acidosis, family hx of Marfan syndrome, kidney stones, HTN, migraine    Diagnostic tests  per patient MRI R knee 09/08/16: showed patellofemoral lig tear and MCL sprain and ACL rupture     Patient Stated Goals  be able to walk right; get back to work    Currently in Pain?  No/denies    Pain Score  0-No pain    Multiple Pain Sites  No         [facility] PT Assessment - 12/04/17 0947      AROM   Right/Left Knee  Right    Right Knee Extension  0    Right Knee Flexion  90      PROM   Right/Left Knee  Right    Right Knee Extension  0    Right Knee Flexion  91                   [facility] Adult PT Treatment/Exercise - 12/04/17 0945      Knee/Hip Exercises: Stretches   Quad Stretch  Right;1 rep;60 seconds;2 reps    [employer]  Stretch Limitations  bolster under thigh     Hip Flexor Stretch  Right;60 seconds;1 rep    Hip Flexor Stretch Limitations  mod thomas R LE resting on floor       Knee/Hip Exercises: Standing   Hip Flexion  Right;Left;10 reps;Knee straight;Stengthening    Hip Flexion Limitations  yellow TB at ankle; 2 ski poles     Hip ADduction  Right;Left;10 reps;Strengthening    Hip ADduction Limitations  yellow TB at ankle; 2 ski poles     Hip Abduction  Right;Left;10 reps;Knee straight;Stengthening    Abduction Limitations  yellow TB at ankle; 2 ski poles    Hip Extension  Right;Left;10 reps;Knee straight;Stengthening    Extension Limitations  yellow TB at ankle; 2 ki poles    Lateral Step Up  Right;15 reps;Step Height: 6";Hand Hold: 1    Lateral Step Up Limitations  cues to control eccentric     Forward Step Up  Right;10 reps;Hand Hold: 1;Step Height: 8"    Forward Step Up Limitations  Cues for eccentric control      Wall Squat  15 reps;5 seconds    Wall Squat Limitations  to 70 dg as pt. lacking control beyond this       Vasopneumatic   Number Minutes Vasopneumatic   15 minutes    Vasopnuematic Location   Knee    Vasopneumatic Pressure  Medium    Vasopneumatic Temperature   Coldest temp      Manual Therapy   Manual Therapy  Joint mobilization    Manual therapy comments  supine     Joint Mobilization  R patellar mobilizations grade III in inferior, superior, directions only to tolerance; R A/P mobs for improved ROM              PT Education - 12/04/17 1020    Education Details  HEP update; yellow TB issued to pt.     Person(s) Educated  Patient    Methods  Explanation;Demonstration    Comprehension  Verbalized understanding;Returned demonstration;Verbal cues required;Need further instruction       PT Short Term Goals - 11/27/17 0905      PT SHORT TERM GOAL #1   Title  Patient independent with initial HEP.    Time  4    Period  Weeks    Status  Achieved        PT Long Term Goals - 12/04/17 1041      PT LONG TERM GOAL #1   Title  Patient to be independnet with advanced HEP.    Time  8    Period  Weeks    Status  Partially Met      PT LONG TERM GOAL #2   Title  Patient to demonstrate R knee AROM/PROM 0-120 without pain limiting.    Time  8    Period  Weeks    Status  On-going      PT LONG TERM GOAL #3   Title  Patient to demonstrate B LE strength >=4+/5.    Time  8    Period  Weeks    Status  On-going      PT LONG TERM GOAL #4   Title  Patient to demonstrate equal step length, weight shift, and knee flexion throughout ambulation with LRAD.    Time  8    Period  Weeks    Status  On-going      PT LONG TERM GOAL #5   Title  Patient  to demonstrate SLR withotu evidence of quad lag.     Time  8    Period  Weeks    Status  Achieved            Plan - 12/04/17 0940    Clinical Impression Statement  Teonia reporting some benefit from recent steroid injection  to R knee with recent MD f/u - feels reduction in "fluid" at R knee which she feels has improved her ability to bend knee.  Able to demo improved R knee AROM 0-90, PROM 0-91 dg today.  Tolerated addition of standing 4-way hip kicker with yellow TB well today thus updated HEP with this.  Pt. able to progress step height with step training activities and demonstrating improved stability with wall sit.  Notes HEP going well.  Ended with ice/compression to R knee to reduce post-exercise swelling.  Able to achieve LTG #5 today. Will continue to progress toward goals.      PT Treatment/Interventions  ADLs/Self Care Home Management;Cryotherapy;Electrical Stimulation;Iontophoresis 19m/ml Dexamethasone;Moist Heat;Ultrasound;Gait training;Stair training;Functional mobility training;DME Instruction;Therapeutic activities;Therapeutic exercise;Manual techniques;Patient/family education;Neuromuscular re-education;Balance training;Passive range of motion;Dry needling;Energy conservation;Scar mobilization;Splinting;Taping;Vasopneumatic Device       Patient will benefit from skilled therapeutic intervention in order to improve the following deficits and impairments:  Increased edema, Decreased activity tolerance, Decreased strength, Pain, Decreased mobility, Difficulty walking, Decreased range of motion, Impaired flexibility, Hypermobility, Decreased scar mobility  Visit Diagnosis: Acute pain of right knee  Stiffness of right knee, not elsewhere classified  Muscle weakness (generalized)  Difficulty in walking, not elsewhere classified     Problem List Patient Active Problem List   Diagnosis Date Noted  . Kidney stone 10/09/2017  . Preoperative cardiovascular examination 10/09/2017  . Essential hypertension 06/09/2017  . History of kidney stones 04/08/2017  . Family history of Marfan syndrome 04/08/2017  . Migraine 04/08/2017    MBess Harvest PTA 12/04/17 10:44 AM   [facility] [place] [address]Phone: [phone]  Fax:  [phone] Name: SHOPE BRANDENBURGERMRN: [id number] of Birth: 5Dec 04, 1997

## 2017-12-15 ENCOUNTER — Encounter: Payer: Self-pay | Admitting: Physical Therapy

## 2017-12-15 ENCOUNTER — Ambulatory Visit: Payer: No Typology Code available for payment source | Attending: Orthopaedic Surgery | Admitting: Physical Therapy

## 2017-12-15 DIAGNOSIS — R262 Difficulty in walking, not elsewhere classified: Secondary | ICD-10-CM | POA: Diagnosis present

## 2017-12-15 DIAGNOSIS — M25561 Pain in right knee: Secondary | ICD-10-CM | POA: Diagnosis not present

## 2017-12-15 DIAGNOSIS — M25661 Stiffness of right knee, not elsewhere classified: Secondary | ICD-10-CM | POA: Diagnosis present

## 2017-12-15 DIAGNOSIS — M6281 Muscle weakness (generalized): Secondary | ICD-10-CM | POA: Diagnosis present

## 2017-12-15 NOTE — Therapy (Signed)
[facility] [address]  [place], [place], [address] Phone: [phone]   Fax:  [phone]  Physical Therapy Treatment  Patient Details  Name: Carol Pugh MRN: [id number] Date of Birth: 22-Nov-1995 Referring Provider (PT): Ophelia Charter, MD   Encounter Date: 12/15/2017  PT End of Session - 12/15/17 1101    Visit Number  17    Number of Visits  23    Date for PT Re-Evaluation  12/16/17    Authorization Type  [facility]    PT Start Time  1016    PT Stop Time  1107    PT Time Calculation (min)  51 min    Activity Tolerance  Patient tolerated treatment well    Behavior During Therapy  [facility] for tasks assessed/performed       Past Medical History:  Diagnosis Date  . Anxiety   . Depression   . Distal renal tubular acidosis   . Family history of Marfan syndrome   . Headache   . Heart murmur   . History of kidney stones   . Hypertension   . Kidney stones   . Migraine   . PCOS (polycystic ovarian syndrome)     Past Surgical History:  Procedure Laterality Date  . EXTRACORPOREAL SHOCK WAVE LITHOTRIPSY Left 06/11/2017   Procedure: LEFT EXTRACORPOREAL SHOCK WAVE LITHOTRIPSY (ESWL);  Surgeon: Raynelle Bring, MD;  [facility];  Service: Urology;  Laterality: Left;  . KNEE ARTHROSCOPY WITH ANTERIOR CRUCIATE LIGAMENT (ACL) REPAIR WITH HAMSTRING GRAFT Right 10/19/2017   Procedure: RIGHT KNEE ARTHROSCOPY WITH ANTERIOR CRUCIATE LIGAMENT (ACL) REPAIR WITH AUTOGRAFT HAMSTRING;  Surgeon: Hiram Gash, MD;  Location: [place];  Service: Orthopedics;  Laterality: Right;  . MEDIAL PATELLOFEMORAL LIGAMENT REPAIR Right 10/19/2017   Procedure: MEDIAL PATELLA FEMORAL LIGAMENT RECONSTRUCTION RIGHT KNEE;  Surgeon: Hiram Gash, MD;  Location: [place];  Service: Orthopedics;  Laterality: Right;  . urinary stent placements      There were no vitals filed for this visit.  Subjective Assessment -  12/15/17 1018    Subjective  Patient reports that she goes back to work this weekend and is looking forward to it. Knee stiffness is better.     Pertinent History  distal renal tibular acidosis, family hx of Marfan syndrome, kidney stones, HTN, migraine    Diagnostic tests  per patient MRI R knee 09/08/16: showed patellofemoral lig tear and MCL sprain and ACL rupture     Patient Stated Goals  be able to walk right; get back to work    Currently in Pain?  No/denies                       [facility] Adult PT Treatment/Exercise - 12/15/17 0001      Knee/Hip Exercises: Stretches   Personal assistant reps;30 seconds    Quad Stretch Limitations  folded pillow under knee with strap      Knee/Hip Exercises: Aerobic   Recumbent Bike  L1 x6 min partial revolutions      Knee/Hip Exercises: Standing   Hip Flexion  Right;Left;10 reps;Knee straight;Stengthening    Hip Flexion Limitations  red TB around ankle; 2 ski poles    Hip ADduction  Right;Left;10 reps;Strengthening    Hip ADduction Limitations  red TB around ankle; 2 ski poles    Hip Abduction  Right;Left;10 reps;Knee straight;Stengthening    Abduction Limitations  red TB around  ankle; 2 ski poles    Hip Extension  Right;Left;10 reps;Knee straight;Stengthening    Extension Limitations  red TB around ankle; 2 ski poles    Lateral Step Up  Right;Step Height: 6";2 sets;10 reps;Hand Hold: 2   cues required to slow down   Lateral Step Up Limitations  R knee weakness/shaking evident on step down    Forward Step Up  Right;10 reps;Hand Hold: 2;Step Height: 6";Step Height: 8"   5x 6"; 10x 8"    Forward Step Up Limitations  good form    Functional Squat  1 set;10 reps   TRX squat at 90 degrees   Other Standing Knee Exercises  R & L side lunges on TRX x10 each LE   on R- mini squat and depth to tolerance     Knee/Hip Exercises: Seated   Sit to Sand  1 set;10 reps;without UE support   cues for R weight shift     Vasopneumatic    Number Minutes Vasopneumatic   10 minutes    Vasopnuematic Location   Knee   R   Vasopneumatic Pressure  Medium    Vasopneumatic Temperature   Coldest temp               PT Short Term Goals - 11/27/17 0905      PT SHORT TERM GOAL #1   Title  Patient independent with initial HEP.    Time  4    Period  Weeks    Status  Achieved        PT Long Term Goals - 12/04/17 1041      PT LONG TERM GOAL #1   Title  Patient to be independnet with advanced HEP.    Time  8    Period  Weeks    Status  Partially Met      PT LONG TERM GOAL #2   Title  Patient to demonstrate R knee AROM/PROM 0-120 without pain limiting.    Time  8    Period  Weeks    Status  On-going      PT LONG TERM GOAL #3   Title  Patient to demonstrate B LE strength >=4+/5.    Time  8    Period  Weeks    Status  On-going      PT LONG TERM GOAL #4   Title  Patient to demonstrate equal step length, weight shift, and knee flexion throughout ambulation with LRAD.    Time  8    Period  Weeks    Status  On-going      PT LONG TERM GOAL #5   Title  Patient to demonstrate SLR withotu evidence of quad lag.     Time  8    Period  Weeks    Status  Achieved            Plan - 12/15/17 1101    Clinical Impression Statement  Patient arrived to session with no new complaints. Able to perform STS without UE support and with much improved control today. Still requiring cues to weight shift to R LE. Worked on anterior and lateral step ups on R LE with evident weakness and shaking on R LE when performing eccentric lower with lateral step down. Introduced TRX squats and mini lunges. Good form with squats, mild weakness and shaking on R LE with side lunges. Patient reporting return to work this weekend, and feels she is prepared and ready. Spoke with patient about eventual  return to previous gym regimen. Feel that patient is near PLOF at this time and would most benefit from dropping down to 1x/week for remaining visits.  Patient in agreement. Ended session with [employer] to R knee for post-exercise edema. No complaints at end of session.     PT Treatment/Interventions  ADLs/Self Care Home Management;Cryotherapy;Electrical Stimulation;Iontophoresis 35m/ml Dexamethasone;Moist Heat;Ultrasound;Gait training;Stair training;Functional mobility training;DME Instruction;Therapeutic activities;Therapeutic exercise;Manual techniques;Patient/family education;Neuromuscular re-education;Balance training;Passive range of motion;Dry needling;Energy conservation;Scar mobilization;Splinting;Taping;Vasopneumatic Device    PT Next Visit Plan  progress closed chain strengthening     Consulted and Agree with Plan of Care  Patient       Patient will benefit from skilled therapeutic intervention in order to improve the following deficits and impairments:  Increased edema, Decreased activity tolerance, Decreased strength, Pain, Decreased mobility, Difficulty walking, Decreased range of motion, Impaired flexibility, Hypermobility, Decreased scar mobility  Visit Diagnosis: Acute pain of right knee  Stiffness of right knee, not elsewhere classified  Muscle weakness (generalized)  Difficulty in walking, not elsewhere classified     Problem List Patient Active Problem List   Diagnosis Date Noted  . Kidney stone 10/09/2017  . Preoperative cardiovascular examination 10/09/2017  . Essential hypertension 06/09/2017  . History of kidney stones 04/08/2017  . Family history of Marfan syndrome 04/08/2017  . Migraine 04/08/2017    YJanene Harvey PT, DPT 12/15/17 11:11 AM   [facility] [place] [address]Phone: [phone]  Fax:  [phone] Name: Carol PUTMANMRN: [id number] of Birth: [birth year]

## 2017-12-18 ENCOUNTER — Encounter: Payer: No Typology Code available for payment source | Admitting: Physical Therapy

## 2017-12-22 ENCOUNTER — Other Ambulatory Visit: Payer: Self-pay | Admitting: Family Medicine

## 2017-12-22 DIAGNOSIS — G43809 Other migraine, not intractable, without status migrainosus: Secondary | ICD-10-CM

## 2017-12-22 NOTE — Telephone Encounter (Signed)
Copied from CRM [id number]. Topic: Quick Communication - Rx Refill/Question >> Dec 22, 2017  3:26 PM [place], [place] wrote: Medication: propranolol ER (INDERAL LA) 60 MG 24 hr capsule / Pt stated the pharmacy told her to contact office for refill. Stated she is completely out  Has the patient contacted their pharmacy? Yes.   (Agent: If no, request that the patient contact the pharmacy for the refill.) (Agent: If yes, when and what did the pharmacy advise?)  Preferred Pharmacy (with phone number or street name): [facility] [place] [id number] [phone] (Phone) [phone] (Fax)    Agent: Please be advised that RX refills may take up to 3 business days. We ask that you follow-up with your pharmacy.

## 2017-12-23 MED FILL — PROPRANOLOL ER 60 MG CAP: 60 | 30 days supply | Qty: 30 | Fill #0

## 2017-12-25 ENCOUNTER — Ambulatory Visit: Payer: No Typology Code available for payment source

## 2017-12-25 DIAGNOSIS — M6281 Muscle weakness (generalized): Secondary | ICD-10-CM

## 2017-12-25 DIAGNOSIS — R262 Difficulty in walking, not elsewhere classified: Secondary | ICD-10-CM

## 2017-12-25 DIAGNOSIS — M25561 Pain in right knee: Secondary | ICD-10-CM

## 2017-12-25 DIAGNOSIS — M25661 Stiffness of right knee, not elsewhere classified: Secondary | ICD-10-CM

## 2017-12-25 NOTE — Therapy (Signed)
[facility] [address]  [place], [place], [address] Phone: [phone]   Fax:  [phone]  Physical Therapy Treatment  Patient Details  Name: Carol Pugh MRN: [id number] Date of Birth: March 18, 1995 Referring Provider (PT): Carol Charter, MD   Encounter Date: 12/25/2017  PT End of Session - 12/25/17 1021    Visit Number  18    Number of Visits  23    Date for PT Re-Evaluation  01/29/18    Authorization Type  [facility]    PT Start Time  1017    PT Stop Time  1112    PT Time Calculation (min)  55 min    Activity Tolerance  Patient tolerated treatment well    Behavior During Therapy  [facility], [facility], [facility] for tasks assessed/performed       Past Medical History:  Diagnosis Date  . Anxiety   . Depression   . Distal renal tubular acidosis   . Family history of Marfan syndrome   . Headache   . Heart murmur   . History of kidney stones   . Hypertension   . Kidney stones   . Migraine   . PCOS (polycystic ovarian syndrome)     Past Surgical History:  Procedure Laterality Date  . EXTRACORPOREAL SHOCK WAVE LITHOTRIPSY Left 06/11/2017   Procedure: LEFT EXTRACORPOREAL SHOCK WAVE LITHOTRIPSY (ESWL);  Surgeon: Carol Bring, MD;  Location: [facility];  Service: Urology;  Laterality: Left;  . KNEE ARTHROSCOPY WITH ANTERIOR CRUCIATE LIGAMENT (ACL) REPAIR WITH HAMSTRING GRAFT Right 10/19/2017   Procedure: RIGHT KNEE ARTHROSCOPY WITH ANTERIOR CRUCIATE LIGAMENT (ACL) REPAIR WITH AUTOGRAFT HAMSTRING;  Surgeon: Carol Gash, MD;  Location: [facility];  Service: Orthopedics;  Laterality: Right;  . MEDIAL PATELLOFEMORAL LIGAMENT REPAIR Right 10/19/2017   Procedure: MEDIAL PATELLA FEMORAL LIGAMENT RECONSTRUCTION RIGHT KNEE;  Surgeon: Carol Gash, MD;  Location: [facility];  Service: Orthopedics;  Laterality: Right;  . urinary stent placements      There were no vitals filed for this visit.  Subjective Assessment -  12/25/17 1020    Subjective  Pt. noting R knee is swollen after 12 hours shifts at work.      Pertinent History  distal renal tibular acidosis, family hx of Marfan syndrome, kidney stones, HTN, migraine    Diagnostic tests  per patient MRI R knee 09/08/16: showed patellofemoral lig tear and MCL sprain and ACL rupture     Patient Stated Goals  be able to walk right; get back to work    Currently in Pain?  No/denies    Pain Score  0-No pain    Multiple Pain Sites  No         [facility] PT Assessment - 12/25/17 1048      Assessment   Medical Diagnosis  R ACL and MPFL reconstruction    Referring Provider (PT)  Carol Charter, MD    Onset Date/Surgical Date  10/19/17      AROM   Right/Left Knee  Right    Right Knee Extension  -2    Right Knee Flexion  114      PROM   Right/Left Knee  Right    Right Knee Extension  -2    Right Knee Flexion  116      Strength   Strength Assessment Site  Hip;Knee;Ankle    Right/Left Hip  Left;Right    Right Hip Flexion  4/5    Right Hip  Extension  4+/5    Right Hip External Rotation   4+/5    Right Hip Internal Rotation  4+/5    Right Hip ABduction  4/5    Right Hip ADduction  4/5    Left Hip Flexion  4+/5    Left Hip Extension  4+/5    Left Hip External Rotation  4+/5    Left Hip Internal Rotation  4+/5    Left Hip ABduction  4+/5    Left Hip ADduction  4+/5    Right/Left Knee  Right;Left    Right Knee Flexion  3+/5    Right Knee Extension  4+/5    Left Knee Flexion  5/5    Left Knee Extension  5/5      Ambulation/Gait   Ambulation/Gait  Yes    Ambulation/Gait Assistance  6: Modified independent (Device/Increase time)    Ambulation Distance (Feet)  350 Feet    Assistive device  None    Gait Pattern  Lateral trunk lean to right;Lateral trunk lean to left;Lateral hip instability    Gait Comments  Provided cueing for upright posture                    [facility] Adult PT Treatment/Exercise - 12/25/17 1048      Knee/Hip Exercises:  Aerobic   Recumbent Bike  Lvl 1, 6 min       Knee/Hip Exercises: Standing   Wall Squat  15 reps;5 seconds    Wall Squat Limitations  0-90 dg   Pt. reporting fatigue with this    Other Standing Knee Exercises  Side stepping, monster walk with red looped TB at ankles 2 x 30 ft       Vasopneumatic   Number Minutes Vasopneumatic   10 minutes    Vasopnuematic Location   Knee   R   Vasopneumatic Pressure  Medium    Vasopneumatic Temperature   Coldest temp               PT Short Term Goals - 12/25/17 1142      PT SHORT TERM GOAL #1   Title  Patient independent with initial HEP.    Time  4    Period  Weeks    Status  Achieved        PT Long Term Goals - 12/25/17 1023      PT LONG TERM GOAL #1   Title  Patient to be independnet with advanced HEP.    Time  5    Period  Weeks    Status  Partially Met    Target Date  01/29/18      PT LONG TERM GOAL #2   Title  Patient to demonstrate R knee AROM/PROM 0-120 without pain limiting.    Time  5    Period  Weeks    Status  Partially Met    Target Date  01/29/18      PT LONG TERM GOAL #3   Title  Patient to demonstrate B LE strength >=4+/5.    Time  5    Period  Weeks    Status  Partially Met    Target Date  01/29/18      PT LONG TERM GOAL #4   Title  Patient to demonstrate equal step length, weight shift, and knee flexion throughout ambulation with LRAD.    Time  5    Period  Weeks    Status  Partially Met  Target Date  01/29/18      PT LONG TERM GOAL #5   Title  Patient to demonstrate SLR withotu evidence of quad lag.     Time  8    Period  Weeks    Status  Achieved            Plan - 12/25/17 1022    Clinical Impression Statement  [name] making good progress with therapy.  Able to demo improvement in R knee PROM/AROM today.  Able to demo mild improvement in proximal hip strength with MMT today however updated HEP to focus on remaining deficits.  Pt. still demonstrating lateral trunk lean with gait  pattern today with short-lasting carryover following cueing.  Pt. will continue to benefit from further skilled therapy to improve functional strength/ROM and tolerance for work tasks.        Rehab Potential  Good    PT Frequency  1x / week    PT Duration  Other (comment)   5 weeks   PT Treatment/Interventions  ADLs/Self Care Home Management;Cryotherapy;Electrical Stimulation;Iontophoresis 46m/ml Dexamethasone;Moist Heat;Ultrasound;Gait training;Stair training;Functional mobility training;DME Instruction;Therapeutic activities;Therapeutic exercise;Manual techniques;Patient/family education;Neuromuscular re-education;Balance training;Passive range of motion;Dry needling;Energy conservation;Scar mobilization;Splinting;Taping;Vasopneumatic Device    PT Next Visit Plan  progress closed chain strengthening     Consulted and Agree with Plan of Care  Patient       Patient will benefit from skilled therapeutic intervention in order to improve the following deficits and impairments:  Increased edema, Decreased activity tolerance, Decreased strength, Pain, Decreased mobility, Difficulty walking, Decreased range of motion, Impaired flexibility, Hypermobility, Decreased scar mobility  Visit Diagnosis: Acute pain of right knee  Stiffness of right knee, not elsewhere classified  Muscle weakness (generalized)  Difficulty in walking, not elsewhere classified     Problem List Patient Active Problem List   Diagnosis Date Noted  . Kidney stone 10/09/2017  . Preoperative cardiovascular examination 10/09/2017  . Essential hypertension 06/09/2017  . History of kidney stones 04/08/2017  . Family history of Marfan syndrome 04/08/2017  . Migraine 04/08/2017    MBess Harvest PTA 12/25/17 11:57 AM   [facility] [address] [place] [address]Phone: [phone]  Fax:  [phone] Name: SFORTUNE TOROSIANMRN: [id number]  of Birth: 523-Dec-1997  Patient has shown excellent improvement towards goals. Has nearly met R knee ROM goal, with mild remaining limitation in knee flexion. Patient had shown good improvements in LE strength, with remaining deficits in R hip flexion, abduction, adduction, and R knee flexion. This hip weakness is still slightly apparent in her gait pattern. Patient has returned to work but still noting R knee edema and fatigue with long 12 hour shifts. Plan to continue with PT 1x/week for 5 weeks to address remaining goals and return to PLOF.   YJanene Harvey PT, DPT 12/25/17 11:57 AM

## 2017-12-28 MED FILL — CLOMIPHENE CITRATE 50 MG TA: 50 | 5 days supply | Qty: 5 | Fill #0

## 2017-12-29 ENCOUNTER — Encounter: Payer: No Typology Code available for payment source | Admitting: Physical Therapy

## 2018-01-01 ENCOUNTER — Ambulatory Visit: Payer: No Typology Code available for payment source

## 2018-01-01 DIAGNOSIS — M25561 Pain in right knee: Secondary | ICD-10-CM

## 2018-01-01 DIAGNOSIS — M25661 Stiffness of right knee, not elsewhere classified: Secondary | ICD-10-CM

## 2018-01-01 DIAGNOSIS — M6281 Muscle weakness (generalized): Secondary | ICD-10-CM

## 2018-01-01 DIAGNOSIS — R262 Difficulty in walking, not elsewhere classified: Secondary | ICD-10-CM

## 2018-01-01 NOTE — Therapy (Signed)
[facility] [address]  [place], [place], [address] Phone: [phone]   Fax:  [phone]  Physical Therapy Treatment  Patient Details  Name: Carol Pugh MRN: [id number] Date of Birth: 16-Feb-1995 Referring Provider (PT): Ophelia Charter, MD   Encounter Date: 01/01/2018  PT End of Session - 01/01/18 1018    Visit Number  19    Number of Visits  23    Date for PT Re-Evaluation  01/29/18    Authorization Type  [facility]    PT Start Time  1015    PT Stop Time  1100    PT Time Calculation (min)  45 min    Activity Tolerance  Patient tolerated treatment well    Behavior During Therapy  [facility] for tasks assessed/performed       Past Medical History:  Diagnosis Date  . Anxiety   . Depression   . Distal renal tubular acidosis   . Family history of Marfan syndrome   . Headache   . Heart murmur   . History of kidney stones   . Hypertension   . Kidney stones   . Migraine   . PCOS (polycystic ovarian syndrome)     Past Surgical History:  Procedure Laterality Date  . EXTRACORPOREAL SHOCK WAVE LITHOTRIPSY Left 06/11/2017   Procedure: LEFT EXTRACORPOREAL SHOCK WAVE LITHOTRIPSY (ESWL);  Surgeon: Raynelle Bring, MD;  Location: [facility];  Service: Urology;  Laterality: Left;  . KNEE ARTHROSCOPY WITH ANTERIOR CRUCIATE LIGAMENT (ACL) REPAIR WITH HAMSTRING GRAFT Right 10/19/2017   Procedure: RIGHT KNEE ARTHROSCOPY WITH ANTERIOR CRUCIATE LIGAMENT (ACL) REPAIR WITH AUTOGRAFT HAMSTRING;  Surgeon: Hiram Gash, MD;  Location: [facility];  Service: Orthopedics;  Laterality: Right;  . MEDIAL PATELLOFEMORAL LIGAMENT REPAIR Right 10/19/2017   Procedure: MEDIAL PATELLA FEMORAL LIGAMENT RECONSTRUCTION RIGHT KNEE;  Surgeon: Hiram Gash, MD;  Location: [facility];  Service: Orthopedics;  Laterality: Right;  . urinary stent placements      There were no vitals filed for this visit.  Subjective Assessment -  01/01/18 1015    Subjective  pt. reporting some remaining R knee swelling at work however reports this has improved significantly.      Pertinent History  distal renal tibular acidosis, family hx of Marfan syndrome, kidney stones, HTN, migraine    Diagnostic tests  per patient MRI R knee 09/08/16: showed patellofemoral lig tear and MCL sprain and ACL rupture     Patient Stated Goals  be able to walk right; get back to work    Currently in Pain?  No/denies    Pain Score  0-No pain    Pain Location  --    Pain Orientation  --    Pain Descriptors / Indicators  --    Pain Type  --    Aggravating Factors   --    Multiple Pain Sites  No                       [facility] Adult PT Treatment/Exercise - 01/01/18 1028      Ambulation/Gait   Stairs  Yes    Stairs Assistance  6: Modified independent (Device/Increase time)    Stair Management Technique  Alternating pattern    Number of Stairs  13    Height of Stairs  7    Gait Comments  Pt. able to ascend/descend 14 steps recirocally with and one rail use and good  overall stability only slight r quad weakness noted       Knee/Hip Exercises: Stretches   Sports administrator  Right;1 rep;60 seconds    Quad Stretch Limitations  strap and bolster under thigh      Knee/Hip Exercises: Standing   Forward Lunges  Right;Left;10 reps    Forward Lunges Limitations  shallow "mini" lunge at TM rail with UE support and not exceeding 90 flexion     Side Lunges  Right;Left;10 reps    Side Lunges Limitations  TRX    Forward Step Up  Right;15 reps;Hand Hold: 1;Step Height: 8"    Forward Step Up Limitations  ski poles     Step Down  Right;10 reps;Step Height: 4";Hand Hold: 2    Step Down Limitations  B UE support on machine seat     Other Standing Knee Exercises  Side stepping, with green looped TB at ankles 2 x 30 ft              PT Education - 01/01/18 1217    Education Details  green looped TB issued to pt. for side stepping HEP     Person(s)  Educated  Patient    Methods  Explanation;Verbal cues;Handout    Comprehension  Verbalized understanding;Verbal cues required;Need further instruction       PT Short Term Goals - 12/25/17 1142      PT SHORT TERM GOAL #1   Title  Patient independent with initial HEP.    Time  4    Period  Weeks    Status  Achieved        PT Long Term Goals - 12/25/17 1023      PT LONG TERM GOAL #1   Title  Patient to be independnet with advanced HEP.    Time  5    Period  Weeks    Status  Partially Met    Target Date  01/29/18      PT LONG TERM GOAL #2   Title  Patient to demonstrate R knee AROM/PROM 0-120 without pain limiting.    Time  5    Period  Weeks    Status  Partially Met    Target Date  01/29/18      PT LONG TERM GOAL #3   Title  Patient to demonstrate B LE strength >=4+/5.    Time  5    Period  Weeks    Status  Partially Met    Target Date  01/29/18      PT LONG TERM GOAL #4   Title  Patient to demonstrate equal step length, weight shift, and knee flexion throughout ambulation with LRAD.    Time  5    Period  Weeks    Status  Partially Met    Target Date  01/29/18      PT LONG TERM GOAL #5   Title  Patient to demonstrate SLR withotu evidence of quad lag.     Time  8    Period  Weeks    Status  Achieved            Plan - 01/01/18 1019    Clinical Impression Statement  Carol Pugh doing well today.  Notes significant improvement in knee comfort and reduction in swelling with work related tasks.  Able to demo reciprocal stair navigation in today's session with only slight R quad weakness noted.  Able to tolerate addition of shallow forward and lateral lunge with TRX cable support well  today.  Pain free throughout session.  ROM seems to be visibly improved however not formally measured today.  Ended visit pain free.      Rehab Potential  Good    PT Frequency  1x / week    PT Duration  Other (comment)   5wk    PT Treatment/Interventions  ADLs/Self Care Home  Management;Cryotherapy;Electrical Stimulation;Iontophoresis 54m/ml Dexamethasone;Moist Heat;Ultrasound;Gait training;Stair training;Functional mobility training;DME Instruction;Therapeutic activities;Therapeutic exercise;Manual techniques;Patient/family education;Neuromuscular re-education;Balance training;Passive range of motion;Dry needling;Energy conservation;Scar mobilization;Splinting;Taping;Vasopneumatic Device    PT Next Visit Plan  progress closed chain strengthening     Consulted and Agree with Plan of Care  Patient       Patient will benefit from skilled therapeutic intervention in order to improve the following deficits and impairments:  Increased edema, Decreased activity tolerance, Decreased strength, Pain, Decreased mobility, Difficulty walking, Decreased range of motion, Impaired flexibility, Hypermobility, Decreased scar mobility  Visit Diagnosis: Acute pain of right knee  Stiffness of right knee, not elsewhere classified  Muscle weakness (generalized)  Difficulty in walking, not elsewhere classified     Problem List Patient Active Problem List   Diagnosis Date Noted  . Kidney stone 10/09/2017  . Preoperative cardiovascular examination 10/09/2017  . Essential hypertension 06/09/2017  . History of kidney stones 04/08/2017  . Family history of Marfan syndrome 04/08/2017  . Migraine 04/08/2017    MBess Harvest PTA 01/01/18 12:20 PM      [facility] [address] [place] [address]Phone: [phone]  Fax:  [phone] Name: SZEMA LIZARDOMRN: [id number] of Birth: 503-05-1995

## 2018-01-04 ENCOUNTER — Encounter: Payer: No Typology Code available for payment source | Admitting: Physical Therapy

## 2018-01-04 ENCOUNTER — Ambulatory Visit: Payer: No Typology Code available for payment source | Admitting: Physical Therapy

## 2018-01-04 ENCOUNTER — Telehealth: Payer: Self-pay | Admitting: Family Medicine

## 2018-01-04 DIAGNOSIS — Z111 Encounter for screening for respiratory tuberculosis: Secondary | ICD-10-CM

## 2018-01-04 NOTE — Telephone Encounter (Signed)
Copied from CRM #[id number]. Topic: Appointment Scheduling - Scheduling Inquiry for Clinic >> Jan 01, 2018  3:15 PM Jilda Rocheemaray, Melissa wrote: Reason for CRM: Patient would like to know if she can get a blood draw instead of the TB vaccine, please advise?  Best call back is [phone]  Test ordered/called the patient to schedule lab appt to have done.

## 2018-01-04 NOTE — Telephone Encounter (Signed)
That's fine, order quant gold test, dx tb screen. TY.

## 2018-01-04 NOTE — Telephone Encounter (Signed)
Test ordered/called the patient left message to call back.

## 2018-01-11 NOTE — Telephone Encounter (Signed)
Called left a detailed message that order requested has been ordered.   To please give us a call to schedule a lab appointment to have this done.

## 2018-01-12 ENCOUNTER — Other Ambulatory Visit (INDEPENDENT_AMBULATORY_CARE_PROVIDER_SITE_OTHER): Payer: No Typology Code available for payment source

## 2018-01-12 DIAGNOSIS — Z111 Encounter for screening for respiratory tuberculosis: Secondary | ICD-10-CM

## 2018-01-15 ENCOUNTER — Ambulatory Visit: Payer: No Typology Code available for payment source | Attending: Orthopaedic Surgery | Admitting: Physical Therapy

## 2018-01-15 ENCOUNTER — Encounter: Payer: Self-pay | Admitting: Physical Therapy

## 2018-01-15 DIAGNOSIS — M25561 Pain in right knee: Secondary | ICD-10-CM | POA: Insufficient documentation

## 2018-01-15 DIAGNOSIS — M6281 Muscle weakness (generalized): Secondary | ICD-10-CM | POA: Diagnosis present

## 2018-01-15 DIAGNOSIS — R262 Difficulty in walking, not elsewhere classified: Secondary | ICD-10-CM | POA: Diagnosis present

## 2018-01-15 DIAGNOSIS — M25661 Stiffness of right knee, not elsewhere classified: Secondary | ICD-10-CM | POA: Diagnosis present

## 2018-01-15 LAB — QUANTIFERON-TB GOLD PLUS
Mitogen-NIL: >10 IU/mL
NIL: 0.02 IU/mL
QUANTIFERON-TB GOLD PLUS: NEGATIVE
TB1-NIL: 0.05 IU/mL
TB2-NIL: 0.12 [IU]/mL

## 2018-01-15 NOTE — Therapy (Signed)
[facility] [address]  [place], [place], [address] Phone: [phone]   Fax:  [phone]  Physical Therapy Progress Note  Patient Details  Name: Carol Pugh MRN: [id number] Date of Birth: 03/17/1995 Referring Provider (PT): Ophelia Charter, MD   Encounter Date: 01/15/2018  PT End of Session - 01/15/18 1201    Visit Number  20    Number of Visits  23    Date for PT Re-Evaluation  01/29/18    Authorization Type  [facility]    PT Start Time  1012    PT Stop Time  1103    PT Time Calculation (min)  51 min    Activity Tolerance  Patient tolerated treatment well;Patient limited by pain    Behavior During Therapy  [facility] for tasks assessed/performed       Past Medical History:  Diagnosis Date  . Anxiety   . Depression   . Distal renal tubular acidosis   . Family history of Marfan syndrome   . Headache   . Heart murmur   . History of kidney stones   . Hypertension   . Kidney stones   . Migraine   . PCOS (polycystic ovarian syndrome)     Past Surgical History:  Procedure Laterality Date  . EXTRACORPOREAL SHOCK WAVE LITHOTRIPSY Left 06/11/2017   Procedure: LEFT EXTRACORPOREAL SHOCK WAVE LITHOTRIPSY (ESWL);  Surgeon: Raynelle Bring, MD;  [facility];  Service: Urology;  Laterality: Left;  . KNEE ARTHROSCOPY WITH ANTERIOR CRUCIATE LIGAMENT (ACL) REPAIR WITH HAMSTRING GRAFT Right 10/19/2017   Procedure: RIGHT KNEE ARTHROSCOPY WITH ANTERIOR CRUCIATE LIGAMENT (ACL) REPAIR WITH AUTOGRAFT HAMSTRING;  Surgeon: Hiram Gash, MD;  Location: [place];  Service: Orthopedics;  Laterality: Right;  . MEDIAL PATELLOFEMORAL LIGAMENT REPAIR Right 10/19/2017   Procedure: MEDIAL PATELLA FEMORAL LIGAMENT RECONSTRUCTION RIGHT KNEE;  Surgeon: Hiram Gash, MD;  Location: [place];  Service: Orthopedics;  Laterality: Right;  . urinary stent placements      There were no vitals filed for this  visit.  Subjective Assessment - 01/15/18 1012    Subjective  Reports she was leaving work in the dark and slipped on mud- fell on R knee. Knee is now bruised and has been bothering her. Stairs are now painful as well as some of her squatting HEP. Denies instability of buckling. Besides this event, reporting 80-85% improvement since initial eval. Notes she feels pretty normal in her knee, but still some ROM deficits.     Pertinent History  distal renal tibular acidosis, family hx of Marfan syndrome, kidney stones, HTN, migraine    Diagnostic tests  per patient MRI R knee 09/08/16: showed patellofemoral lig tear and MCL sprain and ACL rupture     Patient Stated Goals  be able to walk right; get back to work    Currently in Pain?  No/denies         [facility] PT Assessment - 01/15/18 0001      Observation/Other Assessments   Focus on Therapeutic Outcomes (FOTO)   Knee: 62 (38% limited, 39% predicted)      AROM   Right/Left Knee  Right    Right Knee Extension  1    Right Knee Flexion  114      PROM   Right/Left Knee  Right    Right Knee Extension  1    Right Knee Flexion  117      Strength  Strength Assessment Site  Hip;Knee;Ankle    Right/Left Hip  Right;Left    Right Hip Flexion  5/5    Right Hip Extension  4+/5    Right Hip ABduction  4/5    Right Hip ADduction  4+/5    Left Hip Flexion  5/5    Left Hip Extension  4+/5    Left Hip ABduction  4/5    Left Hip ADduction  4+/5    Right/Left Knee  Right;Left    Right Knee Flexion  4/5    Right Knee Extension  4+/5    Left Knee Flexion  5/5    Left Knee Extension  5/5    Right Ankle Dorsiflexion  4+/5    Right Ankle Plantar Flexion  4+/5    Left Ankle Dorsiflexion  4+/5    Left Ankle Plantar Flexion  4+/5      Special Tests    Special Tests  --    Knee Special tests   --   Intact valgus and lachman's on R knee                  [facility] Adult PT Treatment/Exercise - 01/15/18 0001      Knee/Hip Exercises: Aerobic    Recumbent Bike  Lvl 2, 6 min       Knee/Hip Exercises: Standing   Lateral Step Up  Right;2 sets;10 reps;Hand Hold: 2;Step Height: 4"    Lateral Step Up Limitations  cues to correct anterior trunk lean    Forward Step Up  Right;Hand Hold: 1;10 reps;Step Height: 4"    Forward Step Up Limitations  1 UE support step down    Functional Squat  1 set;10 reps    Functional Squat Limitations  to mat with 1 foam pad and green TB around knees    SLS  R LE SLS on foam + ball toss 2x20    cues for TKE     Knee/Hip Exercises: Supine   Single Leg Bridge  Strengthening;Right;Left;1 set;10 reps      Vasopneumatic   Number Minutes Vasopneumatic   10 minutes    Vasopnuematic Location   Knee   R   Vasopneumatic Pressure  Medium    Vasopneumatic Temperature   Coldest temp             PT Education - 01/15/18 1200    Education Details  advised patient to ease into LE strengthening HEP activities as tolerated after her fall    Person(s) Educated  Patient    Methods  Explanation;Demonstration    Comprehension  Verbalized understanding       PT Short Term Goals - 01/15/18 1204      PT SHORT TERM GOAL #1   Title  Patient independent with initial HEP.    Time  4    Period  Weeks    Status  Achieved        PT Long Term Goals - 01/15/18 1204      PT LONG TERM GOAL #1   Title  Patient to be independnet with advanced HEP.    Time  5    Period  Weeks    Status  Partially Met   met for current     PT LONG TERM GOAL #2   Title  Patient to demonstrate R knee AROM/PROM 0-120 without pain limiting.    Time  5    Period  Weeks    Status  Partially Met   1-114  deg AROM, 1-117 deg PROM     PT LONG TERM GOAL #3   Title  Patient to demonstrate B LE strength >=4+/5.    Time  5    Period  Weeks    Status  Partially Met   improvements in B hip flexion, R hip abduction, and R knee flexion strength     PT LONG TERM GOAL #4   Title  Patient to demonstrate equal step length, weight shift,  and knee flexion throughout ambulation with LRAD.    Time  5    Period  Weeks    Status  Partially Met   very mildly antalgic gait still evidnet on R LE     PT LONG TERM GOAL #5   Title  Patient to demonstrate SLR withotu evidence of quad lag.     Time  8    Period  Weeks    Status  Achieved            Plan - 01/15/18 1209    Clinical Impression Statement  Patient arrived to session with report of falling on R knee on Sunday and bruising R knee. Mild R medial knee bruising and edema evident upon inspection. Ligamentous testing intact. Patient reporting 80-85% improvement since initial eval, noting that she is back to work and feels "pretty normal" with ADLs, however R knee still feeling somewhat stiff. Patient showing mild improvement in R knee flexion since last measured, mild decrease in TKE- likely d/t edema. Patient showing improvements in B hip flexion, R hip abduction, and R knee flexion strength. Very mildly antalgic gait still evident on R LE, however good improvement overall. Worked on R LE hip strengthening with good tolerance. Able to perform squats with depth to tolerance with good form. Patient struggling somewhat with anterior and lateral step downs on R LE d/t quad instability. Advised patient to ease into LE strengthening HEP activities as tolerated after her fall. Patient reported understanding. Ended session with Gameready to R knee for pain and edema. No complaints at end of session. Patient progressing well towards goals. Will continue to benefit from skilled PT services to address remaining goals.     PT Treatment/Interventions  ADLs/Self Care Home Management;Cryotherapy;Electrical Stimulation;Iontophoresis 60m/ml Dexamethasone;Moist Heat;Ultrasound;Gait training;Stair training;Functional mobility training;DME Instruction;Therapeutic activities;Therapeutic exercise;Manual techniques;Patient/family education;Neuromuscular re-education;Balance training;Passive range of  motion;Dry needling;Energy conservation;Scar mobilization;Splinting;Taping;Vasopneumatic Device    PT Next Visit Plan  progress closed chain strengthening     Consulted and Agree with Plan of Care  Patient       Patient will benefit from skilled therapeutic intervention in order to improve the following deficits and impairments:  Increased edema, Decreased activity tolerance, Decreased strength, Pain, Decreased mobility, Difficulty walking, Decreased range of motion, Impaired flexibility, Hypermobility, Decreased scar mobility  Visit Diagnosis: Acute pain of right knee  Stiffness of right knee, not elsewhere classified  Muscle weakness (generalized)  Difficulty in walking, not elsewhere classified     Problem List Patient Active Problem List   Diagnosis Date Noted  . Kidney stone 10/09/2017  . Preoperative cardiovascular examination 10/09/2017  . Essential hypertension 06/09/2017  . History of kidney stones 04/08/2017  . Family history of Marfan syndrome 04/08/2017  . Migraine 04/08/2017     YJanene Harvey PT, DPT 01/15/18 12:11 PM   [facility] [address] [place] [address]Phone: [phone]  Fax:  [phone] Name: Carol RZASAMRN: [id number] of Birth: 503/13/1997

## 2018-01-19 ENCOUNTER — Telehealth: Payer: Self-pay

## 2018-01-19 DIAGNOSIS — Z1379 Encounter for other screening for genetic and chromosomal anomalies: Secondary | ICD-10-CM

## 2018-01-19 MED FILL — GIANVI 3-0.02 MG TABS: 3-0.02 | 84 days supply | Qty: 84 | Fill #0

## 2018-01-19 MED FILL — PROPRANOLOL ER 60 MG CAP: 60 | 10 days supply | Qty: 10 | Fill #1

## 2018-01-19 MED FILL — metFORMIN HCL 500 MG TABS: 500 | 22 days supply | Qty: 60 | Fill #0

## 2018-01-19 NOTE — Telephone Encounter (Signed)
Copied from CRM [id number]. Topic: Referral - Request for Referral >> Jan 19, 2018  3:35 PM Fanny BienIlderton, Jessica L wrote: Has patient seen PCP for this complaint? Yes  Referral for which specialty: Genetic testing obgyn needs done  [facility] provider/[facility]   Reason for referral: to check for marfans syndrome

## 2018-01-19 NOTE — Telephone Encounter (Signed)
Please advise 

## 2018-01-20 NOTE — Telephone Encounter (Signed)
Referral done

## 2018-01-20 NOTE — Telephone Encounter (Signed)
That's fine

## 2018-01-22 ENCOUNTER — Ambulatory Visit: Payer: No Typology Code available for payment source | Admitting: Physical Therapy

## 2018-01-22 ENCOUNTER — Telehealth: Payer: Self-pay | Admitting: Cardiology

## 2018-01-22 ENCOUNTER — Encounter: Payer: Self-pay | Admitting: Physical Therapy

## 2018-01-22 DIAGNOSIS — R262 Difficulty in walking, not elsewhere classified: Secondary | ICD-10-CM

## 2018-01-22 DIAGNOSIS — M6281 Muscle weakness (generalized): Secondary | ICD-10-CM

## 2018-01-22 DIAGNOSIS — M25661 Stiffness of right knee, not elsewhere classified: Secondary | ICD-10-CM

## 2018-01-22 DIAGNOSIS — M25561 Pain in right knee: Secondary | ICD-10-CM | POA: Diagnosis not present

## 2018-01-22 MED FILL — PHENTERMINE 37.5 MG TABLET: 37.5 | 30 days supply | Qty: 30 | Fill #0

## 2018-01-22 NOTE — Telephone Encounter (Signed)
Patient reports she doesn't need refill anymore she was able to get what she needed from the pharmacy. She has been advised by Obgyn to change propanolol to a different medication. She is going to reach out to pcp about this medication since he originally prescribed it. If pcp is able to help she will cancel next appointment as it was only made to discuss the medication change. She will let us know if she needs [name] for anything else.

## 2018-01-22 NOTE — Therapy (Addendum)
[facility] [address]  [place], [place], [address] Phone: [phone]   Fax:  [phone]  Physical Therapy Treatment  Patient Details  Name: KUSHI KUN MRN: [id number] Date of Birth: 18-Jul-1995 Referring Provider (PT): Ophelia Charter, MD    Encounter Date: 01/22/2018  PT End of Session - 01/22/18 1213    Visit Number  21    Number of Visits  23    Date for PT Re-Evaluation  01/29/18    Authorization Type  [facility]    PT Start Time  1016    PT Stop Time  1101    PT Time Calculation (min)  45 min    Activity Tolerance  Patient tolerated treatment well    Behavior During Therapy  [facility] for tasks assessed/performed       Past Medical History:  Diagnosis Date  . Anxiety   . Depression   . Distal renal tubular acidosis   . Family history of Marfan syndrome   . Headache   . Heart murmur   . History of kidney stones   . Hypertension   . Kidney stones   . Migraine   . PCOS (polycystic ovarian syndrome)     Past Surgical History:  Procedure Laterality Date  . EXTRACORPOREAL SHOCK WAVE LITHOTRIPSY Left 06/11/2017   Procedure: LEFT EXTRACORPOREAL SHOCK WAVE LITHOTRIPSY (ESWL);  Surgeon: Raynelle Bring, MD;  [facility];  Service: Urology;  Laterality: Left;  . KNEE ARTHROSCOPY WITH ANTERIOR CRUCIATE LIGAMENT (ACL) REPAIR WITH HAMSTRING GRAFT Right 10/19/2017   Procedure: RIGHT KNEE ARTHROSCOPY WITH ANTERIOR CRUCIATE LIGAMENT (ACL) REPAIR WITH AUTOGRAFT HAMSTRING;  Surgeon: Hiram Gash, MD;  Location: [place];  Service: Orthopedics;  Laterality: Right;  . MEDIAL PATELLOFEMORAL LIGAMENT REPAIR Right 10/19/2017   Procedure: MEDIAL PATELLA FEMORAL LIGAMENT RECONSTRUCTION RIGHT KNEE;  Surgeon: Hiram Gash, MD;  Location: [place];  Service: Orthopedics;  Laterality: Right;  . urinary stent placements      There were no vitals filed for this visit.  Subjective Assessment -  01/22/18 1019    Subjective  Reports that she saw her MD who advised her that R knee looked good, no x-rays needed. WOuld like to transition to home program next session.     Pertinent History  distal renal tibular acidosis, family hx of Marfan syndrome, kidney stones, HTN, migraine    Diagnostic tests  per patient MRI R knee 09/08/16: showed patellofemoral lig tear and MCL sprain and ACL rupture     Patient Stated Goals  be able to walk right; get back to work    Currently in Pain?  No/denies                       [facility] Adult PT Treatment/Exercise - 01/22/18 0001      Knee/Hip Exercises: Stretches   Sports administrator  Right;2 reps;30 seconds    Sports administrator Limitations  with strap    Contractor reps;30 seconds    Gastroc Stretch Limitations  at wall      Knee/Hip Exercises: Aerobic   Recumbent Bike  Lvl 2, 6 min       Knee/Hip Exercises: Standing   Side Lunges  Right;Left;10 reps    Side Lunges Limitations  TRX    Lateral Step Up  Right;10 reps;Hand Hold: 2;Step Height: 4";1 set    Lateral Step Up Limitations  muscle shaking evident  Forward Step Up  Right;Hand Hold: 1;10 reps;Step Height: 6"    Forward Step Up Limitations  1 UE support step up + down    Functional Squat  10 reps;2 sets    Functional Squat Limitations  TRX;2nd set with green TB    SLS  R LE SLS on foam/firm + ball toss x6 min   considerable hip and ankle instability     Vasopneumatic   Number Minutes Vasopneumatic   10 minutes    Vasopnuematic Location   Knee   R   Vasopneumatic Pressure  Medium    Vasopneumatic Temperature   Coldest temp             PT Education - 01/22/18 1212    Education Details  review and consolidation of HEP    Person(s) Educated  Patient    Methods  Explanation;Demonstration;Tactile cues;Verbal cues;Handout    Comprehension  Verbalized understanding;Returned demonstration       PT Short Term Goals - 01/15/18 1204      PT SHORT TERM GOAL #1    Title  Patient independent with initial HEP.    Time  4    Period  Weeks    Status  Achieved        PT Long Term Goals - 01/15/18 1204      PT LONG TERM GOAL #1   Title  Patient to be independnet with advanced HEP.    Time  5    Period  Weeks    Status  Partially Met   met for current     PT LONG TERM GOAL #2   Title  Patient to demonstrate R knee AROM/PROM 0-120 without pain limiting.    Time  5    Period  Weeks    Status  Partially Met   1-114 deg AROM, 1-117 deg PROM     PT LONG TERM GOAL #3   Title  Patient to demonstrate B LE strength >=4+/5.    Time  5    Period  Weeks    Status  Partially Met   improvements in B hip flexion, R hip abduction, and R knee flexion strength     PT LONG TERM GOAL #4   Title  Patient to demonstrate equal step length, weight shift, and knee flexion throughout ambulation with LRAD.    Time  5    Period  Weeks    Status  Partially Met   very mildly antalgic gait still evidnet on R LE     PT LONG TERM GOAL #5   Title  Patient to demonstrate SLR withotu evidence of quad lag.     Time  8    Period  Weeks    Status  Achieved            Plan - 01/22/18 1213    Clinical Impression Statement  Patient arrived to session with report of F/U with MD who advised her that R knee did not require x-ray after her fall and that she would be ready to transition to HEP soon. Patient requesting to transition to HEP next session. Worked on progressive LE strengthening with patient reporting muscle fatigue, requiring LE stretching to dissipate. Visible muscle fatigue with TRX side lunges- advised patient to avoid deep squatting for safety. Worked on R LE SLS with patient demonstrating considerable hip and ankle instability, with frequent LOB. Reviewed and consolidate HEP and advised patient to maintain consistency of strengthening exercises to avoid re-injury. Patient reported  understanding. Ended session with Gameready to R knee for edema relief. No  complaints at end of session.     PT Treatment/Interventions  ADLs/Self Care Home Management;Cryotherapy;Electrical Stimulation;Iontophoresis 71m/ml Dexamethasone;Moist Heat;Ultrasound;Gait training;Stair training;Functional mobility training;DME Instruction;Therapeutic activities;Therapeutic exercise;Manual techniques;Patient/family education;Neuromuscular re-education;Balance training;Passive range of motion;Dry needling;Energy conservation;Scar mobilization;Splinting;Taping;Vasopneumatic Device    PT Next Visit Plan  progress closed chain strengthening     Consulted and Agree with Plan of Care  Patient       Patient will benefit from skilled therapeutic intervention in order to improve the following deficits and impairments:  Increased edema, Decreased activity tolerance, Decreased strength, Pain, Decreased mobility, Difficulty walking, Decreased range of motion, Impaired flexibility, Hypermobility, Decreased scar mobility  Visit Diagnosis: Acute pain of right knee  Stiffness of right knee, not elsewhere classified  Muscle weakness (generalized)  Difficulty in walking, not elsewhere classified     Problem List Patient Active Problem List   Diagnosis Date Noted  . Kidney stone 10/09/2017  . Preoperative cardiovascular examination 10/09/2017  . Essential hypertension 06/09/2017  . History of kidney stones 04/08/2017  . Family history of Marfan syndrome 04/08/2017  . Migraine 04/08/2017    YJanene Harvey PT, DPT 01/22/18 12:16 PM    [facility] [address] [place] [address]Phone: [phone]  Fax:  [phone] Name: SODALIS JORDANMRN: [id number] of Birth: [birth year] PHYSICAL THERAPY DISCHARGE SUMMARY  Visits from Start of Care: 21  Current functional level related to goals / functional outcomes: Unable to assess; patient did not return for last scheduled appointment     Remaining deficits: Unable to assess   Education / Equipment: HEP  Plan: Patient agrees to discharge.  Patient goals were partially met. Patient is being discharged due to not returning since the last visit.  ?????     YJanene Harvey PT, DPT 02/22/18 10:00 AM

## 2018-01-22 NOTE — Telephone Encounter (Signed)
°  Patient needs enough to get to next appt on 01/29/2018. Is probably going to change meds so does not need whole month.   1. Which medications need to be refilled? (please list name of each medication and dose if known) propanolol ER 60mg  24hr  2. Which pharmacy/location (including street and city if local pharmacy) is medication to be sent to? [facility] downstairs  3. Do they need a 30 day or 90 day supply? 10

## 2018-01-27 ENCOUNTER — Encounter: Payer: Self-pay | Admitting: Family Medicine

## 2018-01-27 ENCOUNTER — Ambulatory Visit (INDEPENDENT_AMBULATORY_CARE_PROVIDER_SITE_OTHER): Payer: No Typology Code available for payment source | Admitting: Family Medicine

## 2018-01-27 VITALS — BP 112/78 | HR 85 | Temp 98.3°F | Ht 69.0 in | Wt 264.5 lb

## 2018-01-27 DIAGNOSIS — F339 Major depressive disorder, recurrent, unspecified: Secondary | ICD-10-CM | POA: Diagnosis not present

## 2018-01-27 DIAGNOSIS — G43809 Other migraine, not intractable, without status migrainosus: Secondary | ICD-10-CM | POA: Diagnosis not present

## 2018-01-27 MED ORDER — BUPROPION HCL ER (SR) 200 MG PO TB12: 200.0000 mg | ORAL_TABLET | Freq: Two times a day (BID) | ORAL | 3 refills | Status: DC

## 2018-01-27 MED ORDER — LABETALOL HCL 200 MG PO TABS: 200.0000 mg | ORAL_TABLET | Freq: Two times a day (BID) | ORAL | 3 refills | Status: DC

## 2018-01-27 MED FILL — LABETALOL HCL 200 MG TABS: 200 | 30 days supply | Qty: 60 | Fill #0

## 2018-01-27 MED FILL — BUPROPION HCL SR 200 MG TAB: 200 | 30 days supply | Qty: 60 | Fill #0

## 2018-01-27 NOTE — Progress Notes (Signed)
Chief Complaint  Patient presents with  . Follow-up    medication    Subjective: Patient is a 23 y.o. female here for f/u.  Patient has a history of migraines.  She is currently well controlled with Imitrex as needed and propranolol extended release 60 mg daily.  She is currently trying to conceive.  Her obstetrician would like her to change from propranolol to labetalol.  The patient is currently interested in this.  She had a migraine yesterday.  Sometimes Excedrin will be helpful for relief instead of Imitrex.  She has a history of depression.  She is currently taking 150 mg XL daily and tolerating well.  Due to the situation of her possibly not being able to carry her own child, she has been having added stress.  She is wondering if we can increase the dose of her medication.  ROS: Neuro: Denies current headache Psych: As noted in HPI  Past Medical History:  Diagnosis Date  . Anxiety   . Depression   . Distal renal tubular acidosis   . Family history of Marfan syndrome   . Headache   . Heart murmur   . History of kidney stones   . Hypertension   . Kidney stones   . Migraine   . PCOS (polycystic ovarian syndrome)     Objective: BP 112/78 (BP Location: Left Arm, Patient Position: Sitting, Cuff Size: Large)   Pulse 85   Temp 98.3 F (36.8 C) (Oral)   Ht 5\' 9"  (1.753 m)   Wt 264 lb 8 oz (120 kg)   SpO2 98%   BMI 39.06 kg/m  General: Awake, appears stated age HEENT: MMM, EOMi Heart: RRR Lungs: CTAB, no rales, wheezes or rhonchi. No accessory muscle use Psych: Age appropriate judgment and insight, normal affect and mood  Assessment and Plan: Other migraine without status migrainosus, not intractable - Plan: labetalol (NORMODYNE) 200 MG tablet  Depression, recurrent (HCC) - Plan: buPROPion (WELLBUTRIN SR) 200 MG 12 hr tablet  #1-Trial labetalol 200 mg twice daily.  Leave Imitrex for now but try to use Tylenol or Excedrin for abortion of headaches. #2-increase  bupropion to 200 mg twice daily from 150 mg dosing. Follow-up in 1 month. The patient voiced understanding and agreement to the plan.  Jilda Roche Lupus, DO 01/27/18  4:49 PM

## 2018-01-27 NOTE — Patient Instructions (Addendum)
Keep the current meds you have, but don't take them for now.   Let us know if you need anything.

## 2018-01-27 NOTE — Progress Notes (Signed)
Pre visit review using our clinic review tool, if applicable. No additional management support is needed unless otherwise documented below in the visit note. 

## 2018-01-29 ENCOUNTER — Ambulatory Visit: Payer: No Typology Code available for payment source | Admitting: Cardiology

## 2018-01-29 ENCOUNTER — Ambulatory Visit: Payer: No Typology Code available for payment source | Admitting: Physical Therapy

## 2018-02-01 ENCOUNTER — Ambulatory Visit (HOSPITAL_COMMUNITY): Payer: Self-pay | Admitting: Obstetrics and Gynecology

## 2018-02-01 ENCOUNTER — Encounter (HOSPITAL_COMMUNITY): Payer: Self-pay

## 2018-02-01 ENCOUNTER — Ambulatory Visit (HOSPITAL_COMMUNITY)
Admission: RE | Admit: 2018-02-01 | Discharge: 2018-02-01 | Disposition: A | Discharge status: Home / Self Care | Payer: No Typology Code available for payment source | Source: Ambulatory Visit | Attending: Obstetrics and Gynecology | Admitting: Obstetrics and Gynecology

## 2018-02-01 DIAGNOSIS — F17210 Nicotine dependence, cigarettes, uncomplicated: Secondary | ICD-10-CM | POA: Insufficient documentation

## 2018-02-01 DIAGNOSIS — I10 Essential (primary) hypertension: Secondary | ICD-10-CM

## 2018-02-01 DIAGNOSIS — E669 Obesity, unspecified: Secondary | ICD-10-CM | POA: Diagnosis not present

## 2018-02-01 DIAGNOSIS — Z3169 Encounter for other general counseling and advice on procreation: Secondary | ICD-10-CM | POA: Diagnosis present

## 2018-02-01 DIAGNOSIS — G43909 Migraine, unspecified, not intractable, without status migrainosus: Secondary | ICD-10-CM | POA: Diagnosis not present

## 2018-02-01 DIAGNOSIS — E282 Polycystic ovarian syndrome: Secondary | ICD-10-CM | POA: Diagnosis not present

## 2018-02-01 DIAGNOSIS — Z8249 Family history of ischemic heart disease and other diseases of the circulatory system: Secondary | ICD-10-CM | POA: Diagnosis not present

## 2018-02-01 NOTE — Consult Note (Signed)
Maternal-Fetal Medicine  Name: Carol Pugh MRN: [id number] Requesting Provider: Thurnell Lose, MD  I had the pleasure of seeing Ms. Spake today at the [facility]. She is here for preconception consultation and also to meet with our genetic counselor.  Family history is significant for Marfan syndrome. Her father had Marfan syndrome and he died at age 23 following rupture of aortic aneurysm. Patient does not know if she carries the mutation.   She has chronic hypertension diagnosed at 23 years of age and has been taking propranolol for several years. She takes labetalol now (200 mg twice daily) and her blood pressures are well controlled. She also had tachycardia recently and was seen by her cardiologist.  Echocardiography performed in April 2019 was normal and showed no evidence of aortic aneurysm. Holter moniotoring was reported as normal. She does not have tachycardia now.   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 Occasionally, she has frontal headaches. It responds well to imitrex prn.  Peanuts seem to trigger migraines. She has no diabetes or any other chronic medical conditions.  PSH: Lithotripsies and stent (renal calculi), arthrosopic knee surgery (right knee) for ligamental injury and dislocation. Medications: Metformin 500 mg bid (PCOS), labetalol, imitrex prn. Allergies: NKDA. Social: Smokes about  PPD, drinks alcohol socially, no history of drug use. She has been married a year and her husband (Caucasian) is in good health. Family: Father had Marfan syndrome (see above). No history of venous thromboembolism in the family. She has a brother and he does not have features of Marfan syndrome.  I counseled the patient on the following: Family history of Marfan syndrome:  Marfan syndrome is inherited in an autosomal dominant fashion and transmitted in 50% of offsprings. Most patients have FBN-1  mutations (chromosome 15). Of particular concern in women who are pregnant or attempting pregnancy, is the presence or absence of aortic disease.  If aortic aneurysm is present, pregnancy increases the likelihood of dissection or rupture. This complication increases in patients who have aortic root dilation greater than 40 millimeters although it can occur in women without aortic root dilation. Women who have enlarged aortic root are advised to undergo surgery before attempting pregnancy.  Our patient had normal echocardiography that did not show aortic disease. Also, she has not been tested for the genetic mutation.  I reassured her that in the absence of aortic root dilation, the risk is very small.  I recommend cardiology consultation during pregnancy with echocardiography early in pregnancy and in the third trimester.   Chronic hypertension: I counseled the patient on the possible adverse effects from uncontrolled hypertension. Adverse effects in pregnancy correlate well with the severity of hypertension and the duration of hypertension. Maternal complications (in poorly-controlled hypertension) include stroke, pulmonary edema, and renal failure. Placental abruption fetal growth restriction. Superimposed preeclamspsia occurs in up to 30% of patients with chronic hypertension.   Most antihypertensives prescribed in pregnancy have established safety-profiles. Labetolol and methyldopa are the most-commonly used drugs. No increased congenital malformations have been noted with these drugs. Calcium channel blocker (nifedipine) can also be used as part of multi-drug regimen.  Fetal surveillance: We recommend serial growth scans to monitor fetal growth, and initiating weekly antepartum testing (BPP and NST) from [redacted] weeks gestation until delivery. If hypertension is severe, early initiation and more frequent antenatal testing should be performed.  Timing of delivery: Consider delivery at 39 weeks. If her  blood pressure is poorly-controlled,  earlier delivery may be considered.  Low-dose aspirin reduces the likelihood of developing preeclampsia. I recommended aspirin 81 mg PO daily until delivery. I also informed her that it is safe to take aspirin and it does not cause adverse fetal effects. Patient is not allergic to aspirin and does not give history of gastric ulcers or bleeding.  Migraines: In a majority of cases, migraines seem to improve in pregnancy. In a small number of cases, they can worsen and also in postparturm period, migraines tend to increase. If patient does not respond to Tylenol, triptans may be given. I reassured her that available studies did not show increase in congenital malformations with triptans (imitrex). It is prudent to avoid trigger factors.  Cigarette smoking: Smoking increases the risk of maternal complications that include placental abruption and placenta previa. Fetal complications include fetal growth restriction, prematurity and stillbirth.  Smoking cessation at any gestational age is beneficial and should routinely be addressed in her prenatal visits. Nicotine-replacement therapy (NRT) can be considered if nonpharmacological approaches have failed. Its chief benefit is that it does not contain other toxic substances present in cigarettes. Cigarettes, in addition to nicotine and Carbon monoxide, contain several other toxic substances.  Transdermal patch of 21 mg/day for 2 weeks followed by14 mg/day for 1 to 2 weeks and then 7 mg/day (maintenance) can be considered in her case. Overall, the benefit of NRT outweighs cigarette smoking as cigarettes contain several other compounds including carcinogens that are harmful to her and her pregnancy.  Patient reports that she is planning to quit smoking before attempting pregnancy.  Preconception: I discussed the benefit of preconception folic acid (162 micrograms daily) to reduce the likelihood of fetal open-neural tube  defects. It should be taken at least one month before conception. We also discussed alcohol use that is preferably avoided once she stops oral contraception. Even binge drinking is associated with adverse fetal effects (fetal alcohol syndrome).  PCOS and medications: Metformin can be safely used in pregnancy if indicated for conditions like diabetes. Once pregnancy is confirmed, metformin (given for PCOS) can be discontinued. Clomid treatment can lead to multiple pregnancies in about 5% of cases.   Obesity: Patient was advised by you to reduce her weight and her goal is to lose about 40 lbs. We briefly discussed benefits of weight reduction including improvement in fertility, reduced pregnancy complications of gestational diabetes, hypertension and operative morbidities (infection). Bariatric surgery should be advised only if medical management fails.  After consultation, the patient met with our genetic counselor. We did not draw labs (including Marfan genetic screening) today and the patient will confirm insurance coverage. We will draw labs at a later date once insurance coverage is confirmed.  Recommendations: -Preconception folic acid. -Continue labetalol. -Low-dose aspirin (81 mg) from 12 weeks' gestation till delivery. -Cardiology consultation in pregnancy. -Aortic root assessments in pregnancy with echocardiography. -Hb A1C to screen for diabetes (if not performed already). -Smoking-cessation counseling and nicotine patches may be considered.  Thank you for your consult. Please do not hesitate to contact me if you have any questions or concerns.  Consultation including face-to-face counseling: 50 min.

## 2018-02-01 NOTE — ED Notes (Signed)
Patient may have genetic screening depending upon insurance coverage. No blood drawn today.

## 2018-02-01 NOTE — ED Notes (Signed)
Pt in MFM for preconception counseling.  BP 117/74, pulse 90, weight 260.4lb.  Pt in with Dr. Judeth CornfieldShankar.

## 2018-02-01 NOTE — ED Notes (Signed)
Patient in session with Genetic Counselor, Lauren Taylor. 

## 2018-02-23 MED FILL — CLOMIPHENE CITRATE 50 MG TA: 50 | 5 days supply | Qty: 5 | Fill #0

## 2018-02-25 ENCOUNTER — Ambulatory Visit: Payer: No Typology Code available for payment source | Admitting: Family Medicine

## 2018-02-25 DIAGNOSIS — Z0289 Encounter for other administrative examinations: Secondary | ICD-10-CM

## 2018-02-26 MED FILL — BUPROPION HCL SR 200 MG TAB: 200 | 30 days supply | Qty: 60 | Fill #1

## 2018-02-26 MED FILL — LABETALOL HCL 200 MG TABS: 200 | 30 days supply | Qty: 60 | Fill #1

## 2018-03-24 ENCOUNTER — Other Ambulatory Visit: Payer: Self-pay | Admitting: Family Medicine

## 2018-03-24 ENCOUNTER — Telehealth: Payer: Self-pay

## 2018-03-24 DIAGNOSIS — E282 Polycystic ovarian syndrome: Secondary | ICD-10-CM

## 2018-03-24 NOTE — Progress Notes (Signed)
ambual

## 2018-03-24 NOTE — Telephone Encounter (Signed)
Copied from CRM [id number]. Topic: General - Other >> Mar 24, 2018  1:41 PM Floria Raveling A wrote: Reason for CRM: Pt has [employer] ins and she is going to have to go to a different GYN for Infertility   Pt just needs in ins referral to go to [facility]  Address: [address], [place], [place] [address] [phone] Reason - PT was diagnosed her with PCOS  Best number  [phone]-

## 2018-03-24 NOTE — Telephone Encounter (Signed)
Referral done////patient informed----left detailed message

## 2018-04-01 MED FILL — LABETALOL HCL 200 MG TABS: 200 | 30 days supply | Qty: 60 | Fill #2

## 2018-04-01 MED FILL — BUPROPION HCL SR 200 MG TAB: 200 | 30 days supply | Qty: 60 | Fill #2

## 2018-04-01 MED FILL — SUMATRIPTAN SUCC 100 MG TAB: 100 | 22 days supply | Qty: 6 | Fill #3

## 2018-04-01 MED FILL — metFORMIN HCL 500 MG TABS: 500 | 22 days supply | Qty: 60 | Fill #1

## 2018-05-04 ENCOUNTER — Telehealth: Payer: Self-pay | Admitting: Family Medicine

## 2018-05-04 NOTE — Telephone Encounter (Signed)
Last appt on 01/27/2018 Was to return in 4 weeks to followup//did not schedule. Called to inquire how doing and if ok to schedule followup appt on migraine//and medication.

## 2018-05-05 ENCOUNTER — Encounter: Payer: Self-pay | Admitting: Family Medicine

## 2018-05-05 ENCOUNTER — Ambulatory Visit (INDEPENDENT_AMBULATORY_CARE_PROVIDER_SITE_OTHER): Payer: No Typology Code available for payment source | Admitting: Family Medicine

## 2018-05-05 ENCOUNTER — Other Ambulatory Visit: Payer: Self-pay

## 2018-05-05 DIAGNOSIS — F339 Major depressive disorder, recurrent, unspecified: Secondary | ICD-10-CM

## 2018-05-05 DIAGNOSIS — G43809 Other migraine, not intractable, without status migrainosus: Secondary | ICD-10-CM

## 2018-05-05 NOTE — Telephone Encounter (Signed)
Called left message to call back 

## 2018-05-05 NOTE — Progress Notes (Signed)
Chief Complaint  Patient presents with  . Migraine    Subjective: Patient is a 23 y.o. female here for f/u migraines. Due to outbreak, we are interacting via web portal for an electronic face-to-face visit. I verified patient's ID using 2 identifiers.   F/u migraines, had been well controlled on Inderal, but changed to Labetalol at request of OB due to her intent on conceiving. They were ok with Imitrex. Her bupropion was also increased from 150 mg to 200 mg bid. No increase in headaches since the change, tolerating medicine well. Imitrex still helpful. She feels much better on the 200 mg dosage of bupropion, no AE's and even her husband notices a difference. Would like to keep things the same for both issues as of now.   ROS: Neuro: As noted in HPI  Past Medical History:  Diagnosis Date  . Anxiety   . Depression   . Distal renal tubular acidosis   . Family history of Marfan syndrome   . Headache   . Heart murmur   . History of kidney stones   . Hypertension   . Kidney stones   . Migraine   . PCOS (polycystic ovarian syndrome)     Objective: No conversational dyspnea Age appropriate judgment and insight Nml affect and mood  Assessment and Plan: Other migraine without status migrainosus, not intractable  Depression, recurrent (HCC)  Cont Labetalol.  Cont bupropion.  F/u in 6 mo or prn.  The patient voiced understanding and agreement to the plan.  Jilda Roche [place], DO 05/05/18  9:49 AM

## 2018-05-08 MED FILL — metFORMIN HCL 500 MG TABS: 500 | 22 days supply | Qty: 60 | Fill #2

## 2018-05-11 ENCOUNTER — Other Ambulatory Visit: Payer: Self-pay | Admitting: Family Medicine

## 2018-05-11 MED ORDER — SUMATRIPTAN SUCCINATE 100 MG PO TABS: 100.0000 mg | ORAL_TABLET | ORAL | 2 refills | Status: DC | PRN

## 2018-05-11 MED FILL — SUMATRIPTAN SUCC 100 MG TAB: 100 | 30 days supply | Qty: 8 | Fill #0

## 2018-05-18 ENCOUNTER — Other Ambulatory Visit: Payer: Self-pay | Admitting: Family Medicine

## 2018-05-18 MED ORDER — METFORMIN HCL 500 MG PO TABS: 500.0000 mg | ORAL_TABLET | Freq: Two times a day (BID) | ORAL | 2 refills | Status: DC

## 2018-05-18 NOTE — Telephone Encounter (Signed)
Her new OB will be filling it but she has not had her appt yet. Told her PCP will fill for now until OB takes over

## 2018-05-18 NOTE — Telephone Encounter (Signed)
I do not see where you have refilled this medication?

## 2018-05-18 NOTE — Telephone Encounter (Signed)
I would imagine this is for PCOS. I am happy to fill, but let's make sure she does not get this from someone else. Ty.

## 2018-05-19 MED FILL — LABETALOL HCL 200 MG TABLET: 200 | 30 days supply | Qty: 60 | Fill #3

## 2018-05-19 MED FILL — CLOMIPHENE CITRATE 50 MG TA: 50 | 5 days supply | Qty: 5 | Fill #1

## 2018-05-19 MED FILL — BUPROPION HCL SR 200 MG TAB: 200 | 30 days supply | Qty: 60 | Fill #3

## 2018-05-19 MED FILL — SUMATRIPTAN SUCC 100 MG TAB: 100 | 30 days supply | Qty: 8 | Fill #0

## 2018-06-11 ENCOUNTER — Telehealth: Payer: Self-pay | Admitting: Family Medicine

## 2018-06-11 DIAGNOSIS — Z319 Encounter for procreative management, unspecified: Secondary | ICD-10-CM

## 2018-06-11 MED FILL — metFORMIN HCL 500 MG TABS: 500 | 30 days supply | Qty: 60 | Fill #0

## 2018-06-11 NOTE — Telephone Encounter (Signed)
Called left message to call back 

## 2018-06-11 NOTE — Telephone Encounter (Signed)
Copied from CRM [id number]. Topic: Referral - Request for Referral >> Jun 11, 2018  9:03 AM Leafy Ro wrote: Has patient seen PCP for this complaint? No. Pt said md is aware she is going to dr Billy [place] for infertility treatment, IUI and pt needs a referral for insurance purpose. Pt has [employer]. Pt needs a referral from dr wendling to dr Billy Coast for IUI treatments. Pt has not started treatment yet will start in about 2 wks     There is a referral to OBGYN from 3/20--is this what she is referring to if so it is good for a year.

## 2018-06-11 NOTE — Telephone Encounter (Signed)
Yes, like you said we have already placed this referral? Is insurance still saying we need one? Did they never have the first one? I have no problem with placing it though.

## 2018-06-11 NOTE — Telephone Encounter (Signed)
Spoke to the patient and she said [employer] stated another referral was needed. Placed a referral///same office///same OBGYN///infertility in notes///and infertility as diagnosis.

## 2018-06-21 ENCOUNTER — Other Ambulatory Visit: Payer: Self-pay | Admitting: Family Medicine

## 2018-06-21 DIAGNOSIS — F339 Major depressive disorder, recurrent, unspecified: Secondary | ICD-10-CM

## 2018-06-21 DIAGNOSIS — G43809 Other migraine, not intractable, without status migrainosus: Secondary | ICD-10-CM

## 2018-06-21 MED FILL — LABETALOL HCL 200 MG TABLET: 200 | 30 days supply | Qty: 60 | Fill #0

## 2018-06-21 MED FILL — BUPROPION HCL SR 200 MG TAB: 200 | 30 days supply | Qty: 60 | Fill #0

## 2018-06-25 ENCOUNTER — Encounter: Payer: Self-pay | Admitting: Family Medicine

## 2018-06-25 ENCOUNTER — Telehealth: Payer: Self-pay | Admitting: Family Medicine

## 2018-06-25 ENCOUNTER — Ambulatory Visit (INDEPENDENT_AMBULATORY_CARE_PROVIDER_SITE_OTHER): Payer: No Typology Code available for payment source | Admitting: Family Medicine

## 2018-06-25 ENCOUNTER — Other Ambulatory Visit: Payer: Self-pay

## 2018-06-25 VITALS — BP 104/70 | HR 96 | Temp 98.3°F | Ht 69.0 in | Wt 232.0 lb

## 2018-06-25 DIAGNOSIS — M545 Low back pain, unspecified: Secondary | ICD-10-CM

## 2018-06-25 MED ORDER — MELOXICAM 15 MG PO TABS: 15.0000 mg | ORAL_TABLET | Freq: Every day | ORAL | 0 refills | Status: DC

## 2018-06-25 MED ORDER — CYCLOBENZAPRINE HCL 10 MG PO TABS: 5.0000 mg | ORAL_TABLET | Freq: Three times a day (TID) | ORAL | 0 refills | Status: DC | PRN

## 2018-06-25 MED FILL — OVIDREL 250 MCG/0.5 ML SYRG: 250 | 1 days supply | Qty: 1 | Fill #0

## 2018-06-25 MED FILL — MELOXICAM 15 MG TABLET: 15 | 20 days supply | Qty: 20 | Fill #0

## 2018-06-25 MED FILL — LETROZOLE 2.5 MG TABS: 2.5 | 5 days supply | Qty: 10 | Fill #0

## 2018-06-25 MED FILL — CYCLOBENZAPRINE HCL 10 MG T: 10 | 6 days supply | Qty: 20 | Fill #0

## 2018-06-25 NOTE — Progress Notes (Signed)
Musculoskeletal Exam  Patient: Carol Pugh DOB: Apr 23, 1995  DOS: 06/25/2018  SUBJECTIVE:  Chief Complaint:   Chief Complaint  Patient presents with  . Back Pain    Carol Pugh is a 23 y.o.  female for evaluation and treatment of her back pain.   Onset:  5 days ago. No inj or change in activity.  Location: r lower Character:  dull and sharp  Progression of issue:  has worsened Associated symptoms: Sometimes tingling in R lower ext Denies bowel/bladder incontinence or weakness Treatment: to date has been stretching, Advil, heat.   Neurovascular symptoms: no weakness  ROS: Musculoskeletal/Extremities: +back pain Neurologic: no numbness, tingling no weakness   Past Medical History:  Diagnosis Date  . Anxiety   . Depression   . Distal renal tubular acidosis   . Family history of Marfan syndrome   . Headache   . Heart murmur   . History of kidney stones   . Hypertension   . Kidney stones   . Migraine   . PCOS (polycystic ovarian syndrome)     Objective:  VITAL SIGNS: BP 104/70 (BP Location: Left Arm, Patient Position: Sitting, Cuff Size: Large)   Pulse 96   Temp 98.3 F (36.8 C) (Oral)   Ht 5\' 9"  (1.753 m)   Wt 232 lb (105.2 kg)   SpO2 96%   BMI 34.26 kg/m  Constitutional: Well formed, well developed. No acute distress. HENT: Normocephalic, atraumatic.  Thorax & Lungs:  No accessory muscle use Extremities: No clubbing. No cyanosis. No edema.  Skin: Warm. Dry. No erythema. No rash.  Musculoskeletal: low back.   Tenderness to palpation: yes, over R sided lumbar parasp msc Deformity: no Ecchymosis: no Straight leg test: negative for Decent hamstring flexibility b/l. Neurologic: Normal sensory function. No focal deficits noted. DTR's equal and symmetry in LE's. No clonus. Psychiatric: Normal mood. Age appropriate judgment and insight. Alert & oriented x 3.    Assessment:  Acute right-sided low back pain without sciatica - Plan: cyclobenzaprine  (FLEXERIL) 10 MG tablet, meloxicam (MOBIC) 15 MG tablet, stretches/exercises, heat, ice, Tylenol. Instructed not to get pregnant on these. Warnings for msc relaxant provided.   Plan: Orders as above. F/u prn. The patient voiced understanding and agreement to the plan.   Grant, DO 06/25/18  11:04 AM

## 2018-06-25 NOTE — Patient Instructions (Addendum)
Heat (pad or rice pillow in microwave) over affected area, 10-15 minutes twice daily.   Do not get pregnant on these medications.  OK to take Tylenol 1000 mg (2 extra strength tabs) or 975 mg (3 regular strength tabs) every 6 hours as needed.  Take Flexeril (cyclobenzaprine) 1-2 hours before planned bedtime. If it makes you drowsy, do not take during the day. You can try half a tab the following night.  EXERCISES  RANGE OF MOTION (ROM) AND STRETCHING EXERCISES - Low Back Pain Most people with lower back pain will find that their symptoms get worse with excessive bending forward (flexion) or arching at the lower back (extension). The exercises that will help resolve your symptoms will focus on the opposite motion.  If you have pain, numbness or tingling which travels down into your buttocks, leg or foot, the goal of the therapy is for these symptoms to move closer to your back and eventually resolve. Sometimes, these leg symptoms will get better, but your lower back pain may worsen. This is often an indication of progress in your rehabilitation. Be very alert to any changes in your symptoms and the activities in which you participated in the 24 hours prior to the change. Sharing this information with your caregiver will allow him or her to most efficiently treat your condition. These exercises may help you when beginning to rehabilitate your injury. Your symptoms may resolve with or without further involvement from your physician, physical therapist or athletic trainer. While completing these exercises, remember:   Restoring tissue flexibility helps normal motion to return to the joints. This allows healthier, less painful movement and activity.  An effective stretch should be held for at least 30 seconds.  A stretch should never be painful. You should only feel a gentle lengthening or release in the stretched tissue. FLEXION RANGE OF MOTION AND STRETCHING EXERCISES:  STRETCH - Flexion, Single  Knee to Chest   Lie on a firm bed or floor with both legs extended in front of you.  Keeping one leg in contact with the floor, bring your opposite knee to your chest. Hold your leg in place by either grabbing behind your thigh or at your knee.  Pull until you feel a gentle stretch in your low back. Hold 30 seconds.  Slowly release your grasp and repeat the exercise with the opposite side. Repeat 2 times. Complete this exercise 3 times per week.   STRETCH - Flexion, Double Knee to Chest  Lie on a firm bed or floor with both legs extended in front of you.  Keeping one leg in contact with the floor, bring your opposite knee to your chest.  Tense your stomach muscles to support your back and then lift your other knee to your chest. Hold your legs in place by either grabbing behind your thighs or at your knees.  Pull both knees toward your chest until you feel a gentle stretch in your low back. Hold 30 seconds.  Tense your stomach muscles and slowly return one leg at a time to the floor. Repeat 2 times. Complete this exercise 3 times per week.   STRETCH - Low Trunk Rotation  Lie on a firm bed or floor. Keeping your legs in front of you, bend your knees so they are both pointed toward the ceiling and your feet are flat on the floor.  Extend your arms out to the side. This will stabilize your upper body by keeping your shoulders in contact with the floor.  Gently and slowly drop both knees together to one side until you feel a gentle stretch in your low back. Hold for 30 seconds.  Tense your stomach muscles to support your lower back as you bring your knees back to the starting position. Repeat the exercise to the other side. Repeat 2 times. Complete this exercise at least 3 times per week.   EXTENSION RANGE OF MOTION AND FLEXIBILITY EXERCISES:  STRETCH - Extension, Prone on Elbows   Lie on your stomach on the floor, a bed will be too soft. Place your palms about shoulder width apart  and at the height of your head.  Place your elbows under your shoulders. If this is too painful, stack pillows under your chest.  Allow your body to relax so that your hips drop lower and make contact more completely with the floor.  Hold this position for 30 seconds.  Slowly return to lying flat on the floor. Repeat 2 times. Complete this exercise 3 times per week.   RANGE OF MOTION - Extension, Prone Press Ups  Lie on your stomach on the floor, a bed will be too soft. Place your palms about shoulder width apart and at the height of your head.  Keeping your back as relaxed as possible, slowly straighten your elbows while keeping your hips on the floor. You may adjust the placement of your hands to maximize your comfort. As you gain motion, your hands will come more underneath your shoulders.  Hold this position 30 seconds.  Slowly return to lying flat on the floor. Repeat 2 times. Complete this exercise 3 times per week.   RANGE OF MOTION- Quadruped, Neutral Spine   Assume a hands and knees position on a firm surface. Keep your hands under your shoulders and your knees under your hips. You may place padding under your knees for comfort.  Drop your head and point your tailbone toward the ground below you. This will round out your lower back like an angry cat. Hold this position for 30 seconds.  Slowly lift your head and release your tail bone so that your back sags into a large arch, like an old horse.  Hold this position for 30 seconds.  Repeat this until you feel limber in your low back.  Now, find your "sweet spot." This will be the most comfortable position somewhere between the two previous positions. This is your neutral spine. Once you have found this position, tense your stomach muscles to support your low back.  Hold this position for 30 seconds. Repeat 2 times. Complete this exercise 3 times per week.   STRENGTHENING EXERCISES - Low Back Sprain These exercises may  help you when beginning to rehabilitate your injury. These exercises should be done near your "sweet spot." This is the neutral, low-back arch, somewhere between fully rounded and fully arched, that is your least painful position. When performed in this safe range of motion, these exercises can be used for people who have either a flexion or extension based injury. These exercises may resolve your symptoms with or without further involvement from your physician, physical therapist or athletic trainer. While completing these exercises, remember:   Muscles can gain both the endurance and the strength needed for everyday activities through controlled exercises.  Complete these exercises as instructed by your physician, physical therapist or athletic trainer. Increase the resistance and repetitions only as guided.  You may experience muscle soreness or fatigue, but the pain or discomfort you are trying to eliminate  should never worsen during these exercises. If this pain does worsen, stop and make certain you are following the directions exactly. If the pain is still present after adjustments, discontinue the exercise until you can discuss the trouble with your caregiver.  STRENGTHENING - Deep Abdominals, Pelvic Tilt   Lie on a firm bed or floor. Keeping your legs in front of you, bend your knees so they are both pointed toward the ceiling and your feet are flat on the floor.  Tense your lower abdominal muscles to press your low back into the floor. This motion will rotate your pelvis so that your tail bone is scooping upwards rather than pointing at your feet or into the floor. With a gentle tension and even breathing, hold this position for 3 seconds. Repeat 2 times. Complete this exercise 3 times per week.   STRENGTHENING - Abdominals, Crunches   Lie on a firm bed or floor. Keeping your legs in front of you, bend your knees so they are both pointed toward the ceiling and your feet are flat on the  floor. Cross your arms over your chest.  Slightly tip your chin down without bending your neck.  Tense your abdominals and slowly lift your trunk high enough to just clear your shoulder blades. Lifting higher can put excessive stress on the lower back and does not further strengthen your abdominal muscles.  Control your return to the starting position. Repeat 2 times. Complete this exercise 3 times per week.   STRENGTHENING - Quadruped, Opposite UE/LE Lift   Assume a hands and knees position on a firm surface. Keep your hands under your shoulders and your knees under your hips. You may place padding under your knees for comfort.  Find your neutral spine and gently tense your abdominal muscles so that you can maintain this position. Your shoulders and hips should form a rectangle that is parallel with the floor and is not twisted.  Keeping your trunk steady, lift your right hand no higher than your shoulder and then your left leg no higher than your hip. Make sure you are not holding your breath. Hold this position for 30 seconds.  Continuing to keep your abdominal muscles tense and your back steady, slowly return to your starting position. Repeat with the opposite arm and leg. Repeat 2 times. Complete this exercise 3 times per week.   STRENGTHENING - Abdominals and Quadriceps, Straight Leg Raise   Lie on a firm bed or floor with both legs extended in front of you.  Keeping one leg in contact with the floor, bend the other knee so that your foot can rest flat on the floor.  Find your neutral spine, and tense your abdominal muscles to maintain your spinal position throughout the exercise.  Slowly lift your straight leg off the floor about 6 inches for a count of 3, making sure to not hold your breath.  Still keeping your neutral spine, slowly lower your leg all the way to the floor. Repeat this exercise with each leg 2 times. Complete this exercise 3 times per week.  POSTURE AND BODY  MECHANICS CONSIDERATIONS - Low Back Sprain Keeping correct posture when sitting, standing or completing your activities will reduce the stress put on different body tissues, allowing injured tissues a chance to heal and limiting painful experiences. The following are general guidelines for improved posture.  While reading these guidelines, remember:  The exercises prescribed by your provider will help you have the flexibility and strength to maintain  correct postures.  The correct posture provides the best environment for your joints to work. All of your joints have less wear and tear when properly supported by a spine with good posture. This means you will experience a healthier, less painful body.  Correct posture must be practiced with all of your activities, especially prolonged sitting and standing. Correct posture is as important when doing repetitive low-stress activities (typing) as it is when doing a single heavy-load activity (lifting).  RESTING POSITIONS Consider which positions are most painful for you when choosing a resting position. If you have pain with flexion-based activities (sitting, bending, stooping, squatting), choose a position that allows you to rest in a less flexed posture. You would want to avoid curling into a fetal position on your side. If your pain worsens with extension-based activities (prolonged standing, working overhead), avoid resting in an extended position such as sleeping on your stomach. Most people will find more comfort when they rest with their spine in a more neutral position, neither too rounded nor too arched. Lying on a non-sagging bed on your side with a pillow between your knees, or on your back with a pillow under your knees will often provide some relief. Keep in mind, being in any one position for a prolonged period of time, no matter how correct your posture, can still lead to stiffness.  PROPER SITTING POSTURE In order to minimize stress and  discomfort on your spine, you must sit with correct posture. Sitting with good posture should be effortless for a healthy body. Returning to good posture is a gradual process. Many people can work toward this most comfortably by using various supports until they have the flexibility and strength to maintain this posture on their own. When sitting with proper posture, your ears will fall over your shoulders and your shoulders will fall over your hips. You should use the back of the chair to support your upper back. Your lower back will be in a neutral position, just slightly arched. You may place a small pillow or folded towel at the base of your lower back for  support.  When working at a desk, create an environment that supports good, upright posture. Without extra support, muscles tire, which leads to excessive strain on joints and other tissues. Keep these recommendations in mind:  CHAIR:  A chair should be able to slide under your desk when your back makes contact with the back of the chair. This allows you to work closely.  The chair's height should allow your eyes to be level with the upper part of your monitor and your hands to be slightly lower than your elbows.  BODY POSITION  Your feet should make contact with the floor. If this is not possible, use a foot rest.  Keep your ears over your shoulders. This will reduce stress on your neck and low back.  INCORRECT SITTING POSTURES  If you are feeling tired and unable to assume a healthy sitting posture, do not slouch or slump. This puts excessive strain on your back tissues, causing more damage and pain. Healthier options include:  Using more support, like a lumbar pillow.  Switching tasks to something that requires you to be upright or walking.  Talking a brief walk.  Lying down to rest in a neutral-spine position.  PROLONGED STANDING WHILE SLIGHTLY LEANING FORWARD  When completing a task that requires you to lean forward while  standing in one place for a long time, place either foot up on  a stationary 2-4 inch high object to help maintain the best posture. When both feet are on the ground, the lower back tends to lose its slight inward curve. If this curve flattens (or becomes too large), then the back and your other joints will experience too much stress, tire more quickly, and can cause pain.  CORRECT STANDING POSTURES Proper standing posture should be assumed with all daily activities, even if they only take a few moments, like when brushing your teeth. As in sitting, your ears should fall over your shoulders and your shoulders should fall over your hips. You should keep a slight tension in your abdominal muscles to brace your spine. Your tailbone should point down to the ground, not behind your body, resulting in an over-extended swayback posture.   INCORRECT STANDING POSTURES  Common incorrect standing postures include a forward head, locked knees and/or an excessive swayback. WALKING Walk with an upright posture. Your ears, shoulders and hips should all line-up.  PROLONGED ACTIVITY IN A FLEXED POSITION When completing a task that requires you to bend forward at your waist or lean over a low surface, try to find a way to stabilize 3 out of 4 of your limbs. You can place a hand or elbow on your thigh or rest a knee on the surface you are reaching across. This will provide you more stability, so that your muscles do not tire as quickly. By keeping your knees relaxed, or slightly bent, you will also reduce stress across your lower back. CORRECT LIFTING TECHNIQUES  DO :  Assume a wide stance. This will provide you more stability and the opportunity to get as close as possible to the object which you are lifting.  Tense your abdominals to brace your spine. Bend at the knees and hips. Keeping your back locked in a neutral-spine position, lift using your leg muscles. Lift with your legs, keeping your back straight.  Test  the weight of unknown objects before attempting to lift them.  Try to keep your elbows locked down at your sides in order get the best strength from your shoulders when carrying an object.     Always ask for help when lifting heavy or awkward objects. INCORRECT LIFTING TECHNIQUES DO NOT:   Lock your knees when lifting, even if it is a small object.  Bend and twist. Pivot at your feet or move your feet when needing to change directions.  Assume that you can safely pick up even a paperclip without proper posture.

## 2018-06-25 NOTE — Telephone Encounter (Signed)
Copied from [place] [phone]. Topic: Appointment Scheduling - Scheduling Inquiry for Clinic >> Jun 24, 2018  1:25 PM Erick Blinks wrote: Reason for CRM: Pt needs appt, lower back pain that has been severe for the past three days. Please advise. Tried calling the office, VM available. Available any time tomorrow.   Scheduled same day appt today 06/25/2018

## 2018-07-06 ENCOUNTER — Telehealth: Payer: Self-pay

## 2018-07-06 DIAGNOSIS — Z319 Encounter for procreative management, unspecified: Secondary | ICD-10-CM

## 2018-07-06 NOTE — Telephone Encounter (Signed)
Copied from [place] [phone]. Topic: Referral - Request for Referral >> Jul 06, 2018  1:54 PM Nils Flack wrote: Has patient seen PCP for this complaint? Yes.   *If NO, is insurance requiring patient see PCP for this issue before PCP can refer them? Referral for which specialty: fertility  Preferred provider/office: [facility]  Reason for referral: a partner of her obgyn

## 2018-07-07 ENCOUNTER — Other Ambulatory Visit: Payer: Self-pay | Admitting: Family Medicine

## 2018-07-07 NOTE — Telephone Encounter (Signed)
OK 

## 2018-07-07 NOTE — Telephone Encounter (Signed)
Referral done

## 2018-07-07 NOTE — Progress Notes (Unsigned)
am

## 2018-07-09 ENCOUNTER — Telehealth: Payer: Self-pay | Admitting: Cardiology

## 2018-07-09 NOTE — Telephone Encounter (Signed)
Virtual Visit Pre-Appointment Phone Call  "(Name), I am calling you today to discuss your upcoming appointment. We are currently trying to limit exposure to the virus that causes COVID-19 by seeing patients at home rather than in the office."  1. "What is the BEST phone number to call the day of the visit?" - include this in appointment notes  2. Do you have or have access to (through a family member/friend) a smartphone with video capability that we can use for your visit?" a. If yes - list this number in appt notes as cell (if different from BEST phone #) and list the appointment type as a VIDEO visit in appointment notes b. If no - list the appointment type as a PHONE visit in appointment notes  3. Confirm consent - "In the setting of the current Covid19 crisis, you are scheduled for a (phone or video) visit with your provider on (date) at (time).  Just as we do with many in-office visits, in order for you to participate in this visit, we must obtain consent.  If you'd like, I can send this to your mychart (if signed up) or email for you to review.  Otherwise, I can obtain your verbal consent now.  All virtual visits are billed to your insurance company just like a normal visit would be.  By agreeing to a virtual visit, we'd like you to understand that the technology does not allow for your provider to perform an examination, and thus may limit your provider's ability to fully assess your condition. If your provider identifies any concerns that need to be evaluated in person, we will make arrangements to do so.  Finally, though the technology is pretty good, we cannot assure that it will always work on either your or our end, and in the setting of a video visit, we may have to convert it to a phone-only visit.  In either situation, we cannot ensure that we have a secure connection.  Are you willing to proceed?" STAFF: Did the patient verbally acknowledge consent to telehealth visit? Document  YES/NO here: YES  4. Advise patient to be prepared - "Two hours prior to your appointment, go ahead and check your blood pressure, pulse, oxygen saturation, and your weight (if you have the equipment to check those) and write them all down. When your visit starts, your provider will ask you for this information. If you have an Apple Watch or Kardia device, please plan to have heart rate information ready on the day of your appointment. Please have a pen and paper handy nearby the day of the visit as well."  5. Give patient instructions for MyChart download to smartphone OR Doximity/Doxy.me as below if video visit (depending on what platform provider is using)  6. Inform patient they will receive a phone call 15 minutes prior to their appointment time (may be from unknown caller ID) so they should be prepared to answer    TELEPHONE CALL NOTE  Carol Pugh has been deemed a candidate for a follow-up tele-health visit to limit community exposure during the Covid-19 pandemic. I spoke with the patient via phone to ensure availability of phone/video source, confirm preferred email & phone number, and discuss instructions and expectations.  I reminded Carol Pugh to be prepared with any vital sign and/or heart rhythm information that could potentially be obtained via home monitoring, at the time of her visit. I reminded Carol Pugh to expect a phone call prior to  her visit.  Frederic Jericho 07/09/2018 1:18 PM   INSTRUCTIONS FOR DOWNLOADING THE MYCHART APP TO SMARTPHONE  - The patient must first make sure to have activated MyChart and know their login information - If Apple, go to [employer] and type in MyChart in the search bar and download the app. If Android, ask patient to go to [employer] and type in [place] in the search bar and download the app. The app is free but as with any other app downloads, their phone may require them to verify saved payment information or Apple/Android  password.  - The patient will need to then log into the app with their MyChart username and password, and select [facility] as their healthcare provider to link the account. When it is time for your visit, go to the MyChart app, find appointments, and click Begin Video Visit. Be sure to Select Allow for your device to access the Microphone and Camera for your visit. You will then be connected, and your provider will be with you shortly.  **If they have any issues connecting, or need assistance please contact MyChart service desk [phone])**  **If using a computer, in order to ensure the best quality for their visit they will need to use either of the following Internet Browsers: [employer], or Google Chrome**  IF USING DOXIMITY or DOXY.ME - The patient will receive a link just prior to their visit by text.     FULL LENGTH CONSENT FOR TELE-HEALTH VISIT   I hereby voluntarily request, consent and authorize [facility] and its employed or contracted physicians, physician assistants, nurse practitioners or other licensed health care professionals (the Practitioner), to provide me with telemedicine health care services (the Services") as deemed necessary by the treating Practitioner. I acknowledge and consent to receive the Services by the Practitioner via telemedicine. I understand that the telemedicine visit will involve communicating with the Practitioner through live audiovisual communication technology and the disclosure of certain medical information by electronic transmission. I acknowledge that I have been given the opportunity to request an in-person assessment or other available alternative prior to the telemedicine visit and am voluntarily participating in the telemedicine visit.  I understand that I have the right to withhold or withdraw my consent to the use of telemedicine in the course of my care at any time, without affecting my right to future care or treatment,  and that the Practitioner or I may terminate the telemedicine visit at any time. I understand that I have the right to inspect all information obtained and/or recorded in the course of the telemedicine visit and may receive copies of available information for a reasonable fee.  I understand that some of the potential risks of receiving the Services via telemedicine include:   Delay or interruption in medical evaluation due to technological equipment failure or disruption;  Information transmitted may not be sufficient (e.g. poor resolution of images) to allow for appropriate medical decision making by the Practitioner; and/or   In rare instances, security protocols could fail, causing a breach of personal health information.  Furthermore, I acknowledge that it is my responsibility to provide information about my medical history, conditions and care that is complete and accurate to the best of my ability. I acknowledge that Practitioner's advice, recommendations, and/or decision may be based on factors not within their control, such as incomplete or inaccurate data provided by me or distortions of diagnostic images or specimens that may result from electronic transmissions. I  understand that the practice of medicine is not an exact science and that Practitioner makes no warranties or guarantees regarding treatment outcomes. I acknowledge that I will receive a copy of this consent concurrently upon execution via email to the email address I last provided but may also request a printed copy by calling the office of [facility].    I understand that my insurance will be billed for this visit.   I have read or had this consent read to me.  I understand the contents of this consent, which adequately explains the benefits and risks of the Services being provided via telemedicine.   I have been provided ample opportunity to ask questions regarding this consent and the Services and have had my questions  answered to my satisfaction.  I give my informed consent for the services to be provided through the use of telemedicine in my medical care  By participating in this telemedicine visit I agree to the above.

## 2018-07-13 ENCOUNTER — Encounter: Payer: Self-pay | Admitting: Cardiology

## 2018-07-13 ENCOUNTER — Telehealth (INDEPENDENT_AMBULATORY_CARE_PROVIDER_SITE_OTHER): Payer: No Typology Code available for payment source | Admitting: Cardiology

## 2018-07-13 ENCOUNTER — Other Ambulatory Visit: Payer: Self-pay

## 2018-07-13 VITALS — BP 112/82 | Ht 69.0 in | Wt 221.0 lb

## 2018-07-13 DIAGNOSIS — Z1329 Encounter for screening for other suspected endocrine disorder: Secondary | ICD-10-CM

## 2018-07-13 DIAGNOSIS — Z13220 Encounter for screening for lipoid disorders: Secondary | ICD-10-CM

## 2018-07-13 DIAGNOSIS — Z8279 Family history of other congenital malformations, deformations and chromosomal abnormalities: Secondary | ICD-10-CM

## 2018-07-13 DIAGNOSIS — I10 Essential (primary) hypertension: Secondary | ICD-10-CM | POA: Diagnosis not present

## 2018-07-13 NOTE — Patient Instructions (Signed)
Medication Instructions:   If you need a refill on your cardiac medications before your next appointment, please call your pharmacy.   Lab work: Your physician recommends that you return FASTING for lipid, liver, TSH, CBC and BMP.  If you have labs (blood work) drawn today and your tests are completely normal, you will receive your results only by: Marland Kitchen MyChart Message (if you have MyChart) OR . A paper copy in the mail If you have any lab test that is abnormal or we need to change your treatment, we will call you to review the results.  Testing/Procedures: NONE  Follow-Up: At [facility], you and your health needs are our priority.  As part of our continuing mission to provide you with exceptional heart care, we have created designated Provider Care Teams.  These Care Teams include your primary Cardiologist (physician) and Advanced Practice Providers (APPs -  Physician Assistants and Nurse Practitioners) who all work together to provide you with the care you need, when you need it. You will need a follow up appointment in 6 months.

## 2018-07-13 NOTE — Addendum Note (Signed)
Addended by: Beckey Rutter on: 07/13/2018 08:45 AM   Modules accepted: Orders

## 2018-07-13 NOTE — Progress Notes (Signed)
Virtual Visit via Video Note   This visit type was conducted due to national recommendations for restrictions regarding the COVID-19 Pandemic (e.g. social distancing) in an effort to limit this patient's exposure and mitigate transmission in our community.  Due to her co-morbid illnesses, this patient is at least at moderate risk for complications without adequate follow up.  This format is felt to be most appropriate for this patient at this time.  All issues noted in this document were discussed and addressed.  A limited physical exam was performed with this format.  Please refer to the patient's chart for her consent to telehealth for [facility].   Date:  07/13/2018   ID:  Carol Pugh, DOB Aug 01, 1995, MRN [id number]  Patient Location: Home Provider Location: Home  PCP:  Sharlene DoryWendling, Nicholas Paul, DO  Cardiologist:  No primary care provider on file.  Electrophysiologist:  None   Evaluation Performed:  Follow-Up Visit  Chief Complaint: Essential hypertension and family history of Marfan's disease  History of Present Illness:    Carol Pugh is a 23 y.o. female with past medical history of essential hypertension.  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  She does not exercise on a regular basis.  At the time of my evaluation, the patient is alert awake oriented and in no distress.  Study Highlights  Carol Pugh, DOB Aug 01, 1995, MRN [id number]  HOLTER MONITOR REPORT:    Date of test:                 06/22/2017 to 06/24/2017 Duration of test:           48 hours Indication:                    Palpitations Ordering physician:         Belva Cromeajan Samuell Knoble MD Referring physician:        Belva Cromeajan Akshat Minehart MD   Baseline rhythm: Sinus  Minimum heart rate: 56 BPM.  Average heart rate: 89 BPM.  Maximal heart rate 149 BPM.  Atrial arrhythmia: None significant.   Ventricular  arrhythmia: None significant   Conduction abnormality: None significant  Symptoms: Patient complained of chest pain.   Conclusion:  The monitoring was unremarkable.  The patient had a rare PACs and PVCs.  Interpreting  cardiologist: Garwin Brothersajan R Raunel Dimartino, MD  Date: 07/02/2017 4:29 PMStudy Conclusions  - Left ventricle: The cavity size was normal. Wall thickness was   normal. Systolic function was normal. The estimated ejection   fraction was in the range of 60% to 65%. Wall motion was normal;   there were no regional wall motion abnormalities. Left   ventricular diastolic function parameters were normal.  Impressions:  - Normal study.           The patient does not have symptoms concerning for COVID-19 infection (fever, chills, cough, or new shortness of breath).    Past Medical History:  Diagnosis Date  . Anxiety   . Depression   . Distal renal tubular acidosis   . Family history of Marfan syndrome   . Headache   . Heart murmur   . History of kidney stones   . Hypertension   . Kidney stones   . Migraine   . PCOS (polycystic ovarian syndrome)    Past Surgical History:  Procedure Laterality Date  . EXTRACORPOREAL SHOCK WAVE  LITHOTRIPSY Left 06/11/2017   Procedure: LEFT EXTRACORPOREAL SHOCK WAVE LITHOTRIPSY (ESWL);  Surgeon: Heloise PurpuraBorden, Lester, MD;  Location: [facility];  Service: Urology;  Laterality: Left;  . KNEE ARTHROSCOPY WITH ANTERIOR CRUCIATE LIGAMENT (ACL) REPAIR WITH HAMSTRING GRAFT Right 10/19/2017   Procedure: RIGHT KNEE ARTHROSCOPY WITH ANTERIOR CRUCIATE LIGAMENT (ACL) REPAIR WITH AUTOGRAFT HAMSTRING;  Surgeon: Bjorn PippinVarkey, Dax T, MD;  Location: [facility];  Service: Orthopedics;  Laterality: Right;  . MEDIAL PATELLOFEMORAL LIGAMENT REPAIR Right 10/19/2017   Procedure: MEDIAL PATELLA FEMORAL LIGAMENT RECONSTRUCTION RIGHT KNEE;  Surgeon: Bjorn PippinVarkey, Dax T, MD;  Location: [facility];  Service: Orthopedics;  Laterality: Right;  . urinary  stent placements       Current Meds  Medication Sig  . buPROPion (WELLBUTRIN SR) 200 MG 12 hr tablet TAKE 1 TABLET BY MOUTH TWICE DAILY  . EPINEPHrine 0.3 mg/0.3 mL IJ SOAJ injection Inject into the muscle once.  Marland Kitchen. GIANVI 3-0.02 MG tablet   . labetalol (NORMODYNE) 200 MG tablet TAKE 1 TABLET BY MOUTH TWICE DAILY  . letrozole (FEMARA) 2.5 MG tablet   . medroxyPROGESTERone (PROVERA) 10 MG tablet   . metFORMIN (GLUCOPHAGE) 500 MG tablet Take 1 tablet (500 mg total) by mouth 2 (two) times daily with a meal.  . OVIDREL 250 MCG/0.5ML injection   . Prenatal Vit-Fe Fumarate-FA (MULTIVITAMIN-PRENATAL) 27-0.8 MG TABS tablet Take 1 tablet by mouth daily at 12 noon.  . SUMAtriptan (IMITREX) 100 MG tablet Take 1 tablet (100 mg total) by mouth every 2 (two) hours as needed for migraine. May repeat in 2 hours if headache persists or recurs.     Allergies:   Bee venom   Social History   Tobacco Use  . Smoking status: Current Every Day Smoker    Packs/day: 0.25    Years: 5.00    Pack years: 1.25    Types: Cigarettes  . Smokeless tobacco: Never Used  Substance Use Topics  . Alcohol use: Not Currently    Frequency: Never  . Drug use: Never     Family Hx: The patient's family history includes Marfan syndrome in her father.  ROS:   Please see the history of present illness.    As mentioned above All other systems reviewed and are negative.   Prior CV studies:   The following studies were reviewed today:  Results of echocardiogram discussed with patient  Labs/Other Tests and Data Reviewed:    EKG:  No ECG reviewed.  Recent Labs: No results found for requested labs within last 8760 hours.   Recent Lipid Panel No results found for: CHOL, TRIG, HDL, CHOLHDL, LDLCALC, LDLDIRECT  Wt Readings from Last 3 Encounters:  07/13/18 221 lb (100.2 kg)  06/25/18 232 lb (105.2 kg)  01/27/18 264 lb 8 oz (120 kg)     Objective:    Vital Signs:  BP 112/82 Comment: unable to check  Ht 5\' 9"   (1.753 m)   Wt 221 lb (100.2 kg)   BMI 32.64 kg/m    VITAL SIGNS:  reviewed  ASSESSMENT & PLAN:    1. Essential hypertension: Her blood pressure stable at this time.  Diet was discussed and the importance of regular exercise stressed.  She is planning to do better in terms of activity and exercise to lose more weight.  I want her to come back in the next few days for all blood work including Chem-7 CBC TSH and liver lipid check. 2. Family history of Marfan's: The patient mentions to me  that she has followed a geneticist for this because of financial issues she has put a hold on it but plans to pursue it later.  Discussed with her the importance of this. It was unremarkable 3. Patient will be seen in follow-up appointment in 6 months or earlier if the patient has any concerns   COVID-19 Education: The signs and symptoms of COVID-19 were discussed with the patient and how to seek care for testing (follow up with PCP or arrange E-visit).  The importance of social distancing was discussed today.  Time:   Today, I have spent 15 minutes with the patient with telehealth technology discussing the above problems.     Medication Adjustments/Labs and Tests Ordered: Current medicines are reviewed at length with the patient today.  Concerns regarding medicines are outlined above.   Tests Ordered: No orders of the defined types were placed in this encounter.   Medication Changes: No orders of the defined types were placed in this encounter.   Follow Up:  Virtual Visit or In Person in 6 month(s)  Signed, Jenean Lindau, MD  07/13/2018 8:26 AM    [facility]

## 2018-07-16 MED FILL — metFORMIN HCL 500 MG TABS: 500 | 30 days supply | Qty: 60 | Fill #1

## 2018-07-23 IMAGING — CR DG ABDOMEN 1V
2 series · 2 of 2 positions shown · non-contrast
Comparison: None.

CLINICAL DATA: Pre lithotripsy.

EXAM:
ABDOMEN - 1 VIEW

[t abdomen supine (1 of 2)]
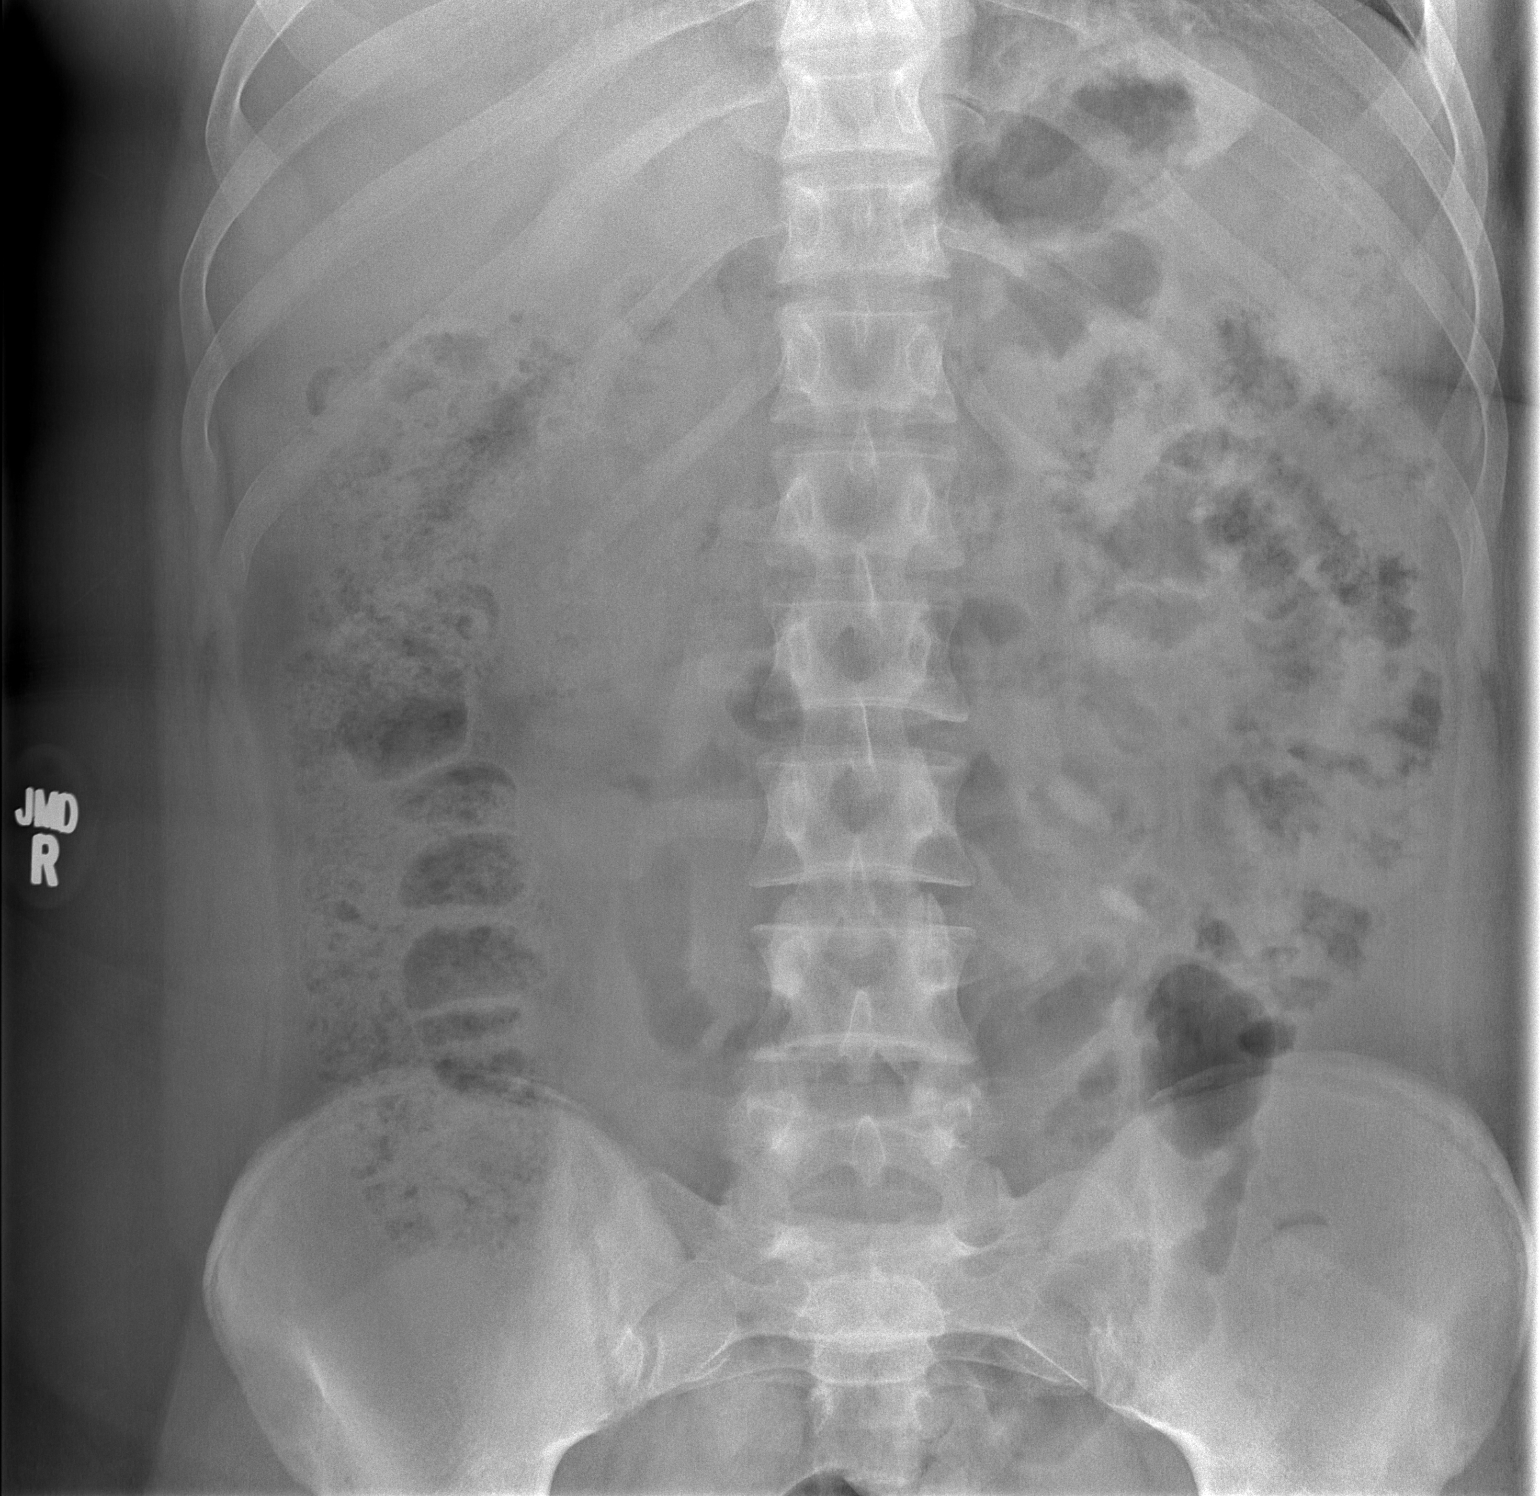

[t abdomen supine (2 of 2)]
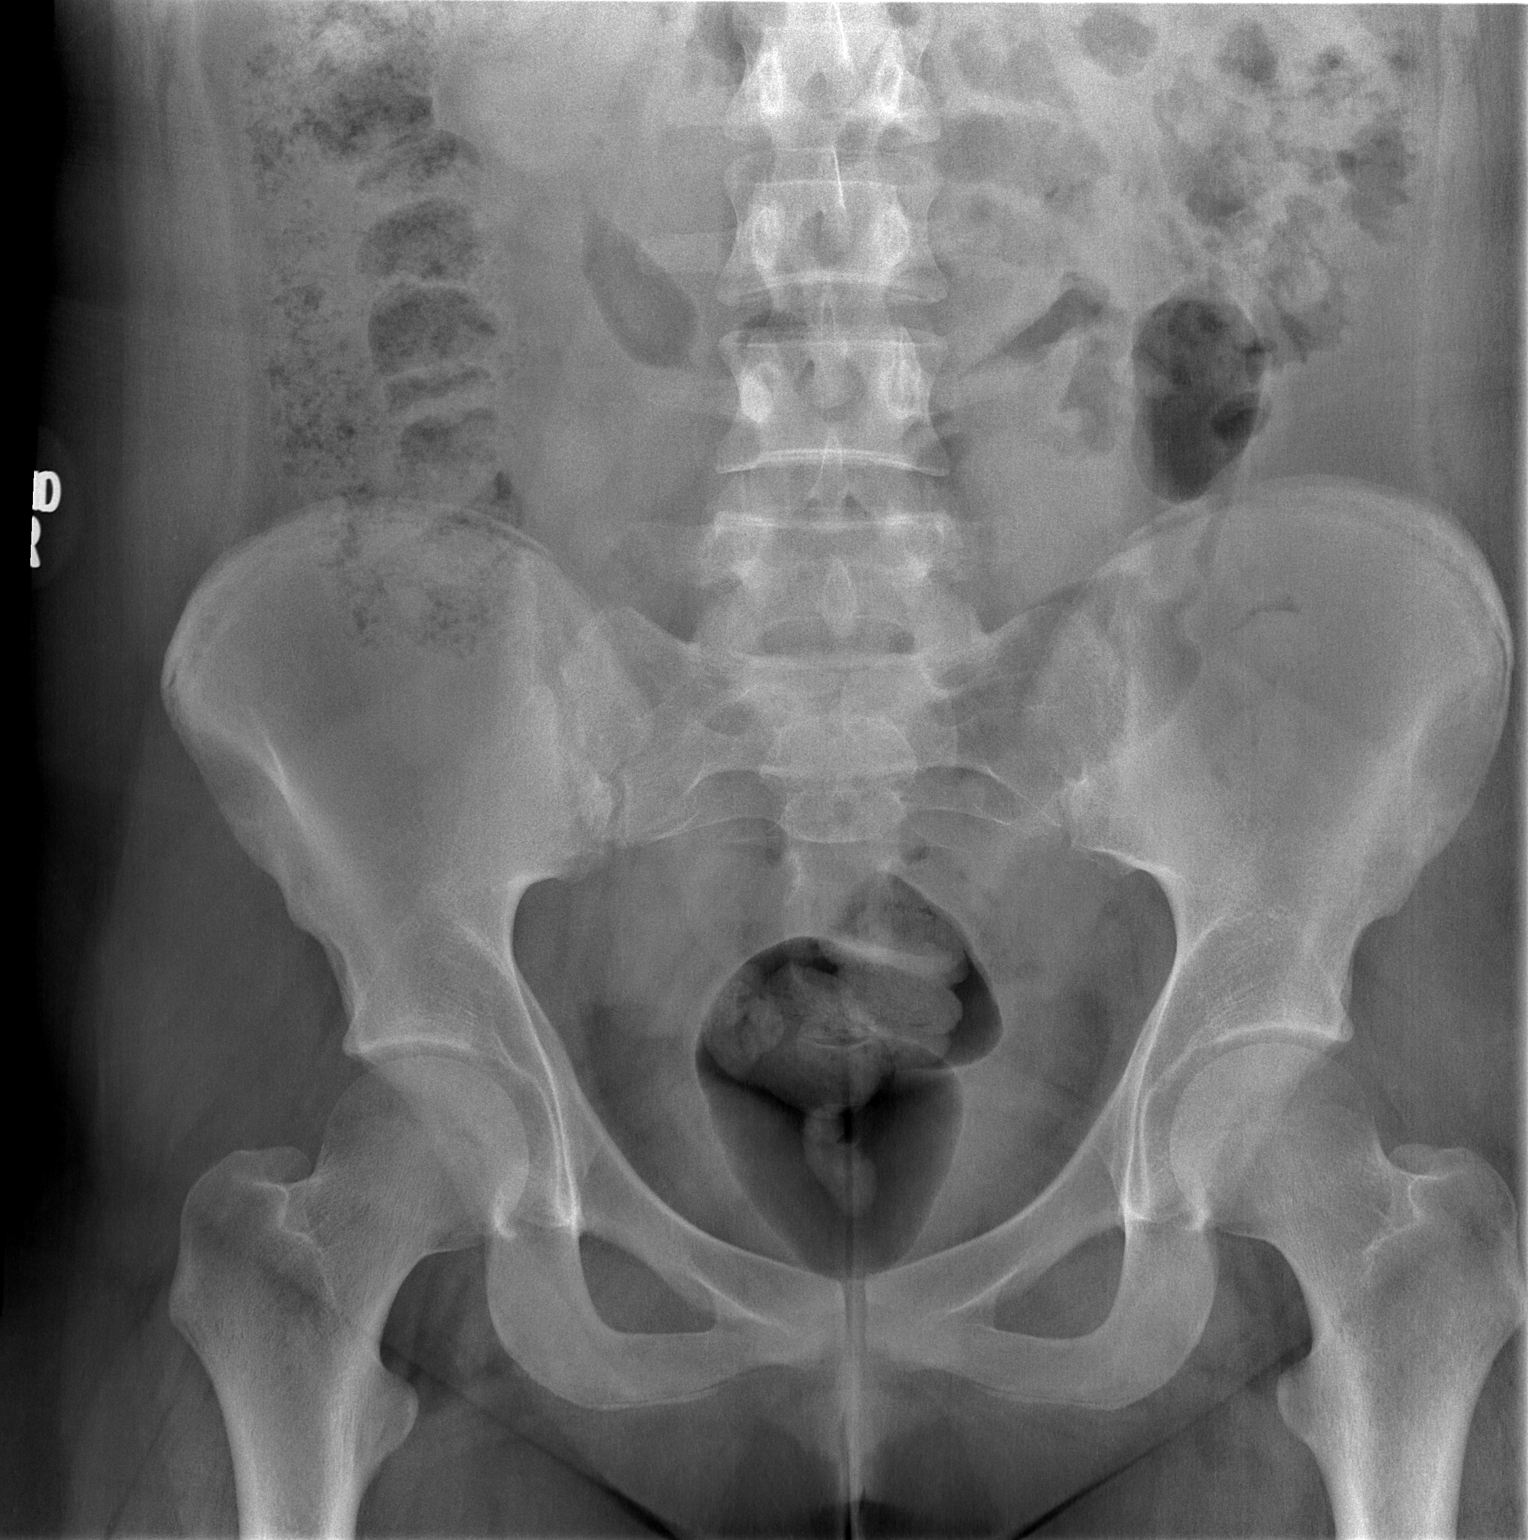

[2 of 2 positions shown; findings below may reference images not displayed]

FINDINGS: Limited by bowel gas. Probable 1 cm stone over the left kidney.
There is also an 11 mm density left paraspinous, which is somewhat
medial for a renal stone. Ovoid densities over the left flank are
oriented obliquely such that they may be pills within the enteric
stream. No stone seen over the bladder.
IMPRESSION: 1. Probable 1 cm left renal calculus.
2. 3 other densities over the left abdomen as described, not
convincing for renal calculi.

## 2018-07-28 MED FILL — LETROZOLE 2.5 MG TABS: 2.5 | 5 days supply | Qty: 10 | Fill #0

## 2018-07-28 MED FILL — OVIDREL 250 MCG/0.5 ML SYRG: 250 | 28 days supply | Qty: 1 | Fill #0

## 2018-07-28 MED FILL — LABETALOL HCL 200 MG TABLET: 200 | 30 days supply | Qty: 60 | Fill #1

## 2018-07-28 MED FILL — BUPROPION HCL SR 200 MG TAB: 200 | 30 days supply | Qty: 60 | Fill #1

## 2018-08-09 ENCOUNTER — Encounter: Payer: Self-pay | Admitting: Family Medicine

## 2018-08-24 MED FILL — BUTALB-ACETAMIN-CAFF 50-325: 50-325-40 | 3 days supply | Qty: 30 | Fill #0

## 2018-08-31 MED FILL — LABETALOL HCL 200 MG TABLET: 200 | 30 days supply | Qty: 60 | Fill #2

## 2018-08-31 MED FILL — metFORMIN HCL 500 MG TABS: 500 | 30 days supply | Qty: 60 | Fill #2

## 2018-08-31 MED FILL — BUPROPION HCL SR 200 MG TAB: 200 | 30 days supply | Qty: 60 | Fill #2

## 2018-09-10 ENCOUNTER — Other Ambulatory Visit: Payer: Self-pay | Admitting: Family Medicine

## 2018-09-10 MED ORDER — EPINEPHRINE 0.3 MG/0.3ML IJ SOAJ: 0.3000 mg | Freq: Once | INTRAMUSCULAR | 1 refills | Status: AC

## 2018-09-10 MED FILL — EPINEPHRINE 0.3 MG AUTO-INJ: 0.3 | 2 days supply | Qty: 2 | Fill #0

## 2018-09-10 NOTE — Telephone Encounter (Signed)
Copied from [place] [phone]. Topic: Quick Communication - Rx Refill/Question >> Sep 10, 2018 10:17 AM Rainey Pines A wrote: Medication: EPINEPHrine 0.3 mg/0.3 mL IJ SOAJ injection Epi Pen (Patient also wanted to inform Dr. Nani Ravens that she is pregnant.)  Has the patient contacted their pharmacy? Yes (Agent: If no, request that the patient contact the pharmacy for the refill.) (Agent: If yes, when and what did the pharmacy advise?)Contact PCP  Preferred Pharmacy (with phone number or street name):[facility], [place] - [place] [phone] (Phone) [phone] (Fax)    Agent: Please be advised that RX refills may take up to 3 business days. We ask that you follow-up with your pharmacy.

## 2018-09-10 NOTE — Telephone Encounter (Signed)
Requested medication (s) are due for refill today: yes  Requested medication (s) are on the active medication list: yes  Last refill:  A year ago  Future visit scheduled: yes  Notes to clinic:  Review for refill Patient also wants provider to know she is pregnant   Requested Prescriptions  Pending Prescriptions Disp Refills   EPINEPHrine 0.3 mg/0.3 mL IJ SOAJ injection 1 each     Sig: Inject into the muscle once for 1 dose.     Immunology: Antidotes Passed - 09/10/2018 10:22 AM      Passed - Valid encounter within last 12 months    Recent Outpatient Visits          2 months ago Acute right-sided low back pain without sciatica   [facility], [place], [place]   4 months ago Other migraine without status migrainosus, not intractable   [facility] at [facility], [place], [place]   7 months ago Other migraine without status migrainosus, not intractable   [facility] at [facility], [place], [place]   1 year ago Dislocation of right patella, initial encounter   [facility] at [facility], [place], [place]   1 year ago Well adult exam   [facility] at [facility], [place], [place]      Future Appointments            In 1 month [place], [place], [place] at [facility], [facility]

## 2018-09-16 NOTE — Progress Notes (Signed)
Office Visit    Patient Name: Carol Pugh Baltz Date of Encounter: 09/17/2018  Primary Care Provider:  Sharlene DoryWendling, Nicholas Paul, DO Primary Cardiologist:  Garwin Brothersajan R Revankar, MD  Chief Complaint    23 year old female with hx migraines, PCOS, HTN, palpitations (PVC/PAC on Holter) presents today for evaluation at the request of her OBGYN.  Past Medical History    Past Medical History:  Diagnosis Date  . Anxiety   . Depression   . Distal renal tubular acidosis   . Family history of Marfan syndrome   . Headache   . Heart murmur   . History of kidney stones   . Hypertension   . Kidney stones   . Migraine   . PCOS (polycystic ovarian syndrome)    Past Surgical History:  Procedure Laterality Date  . EXTRACORPOREAL SHOCK WAVE LITHOTRIPSY Left 06/11/2017   Procedure: LEFT EXTRACORPOREAL SHOCK WAVE LITHOTRIPSY (ESWL);  Surgeon: Heloise PurpuraBorden, Lester, MD;  Location: [facility];  Service: Urology;  Laterality: Left;  . KNEE ARTHROSCOPY WITH ANTERIOR CRUCIATE LIGAMENT (ACL) REPAIR WITH HAMSTRING GRAFT Right 10/19/2017   Procedure: RIGHT KNEE ARTHROSCOPY WITH ANTERIOR CRUCIATE LIGAMENT (ACL) REPAIR WITH AUTOGRAFT HAMSTRING;  Surgeon: Bjorn PippinVarkey, Dax T, MD;  Location: [facility];  Service: Orthopedics;  Laterality: Right;  . MEDIAL PATELLOFEMORAL LIGAMENT REPAIR Right 10/19/2017   Procedure: MEDIAL PATELLA FEMORAL LIGAMENT RECONSTRUCTION RIGHT KNEE;  Surgeon: Bjorn PippinVarkey, Dax T, MD;  Location: [facility];  Service: Orthopedics;  Laterality: Right;  . urinary stent placements      Allergies  Allergies  Allergen Reactions  . Bee Venom     History of Present Illness    Carol Pugh Tatlock is a 23 y.o. female with a hx of migraines, PCOS, HTN, palpitations (PVC/PAC on Holter) last seen by Dr. Tomie Chinaevankar 07/13/18.  She has a family history of Marfan's disease in her father. She has not undergone genetic testing as she has had difficulty getting insurance to cover the cost. She does  mention getting it done independently by [facility] - would require an order, but she is uncertain whether she wants to do this yet as it will be $600 out of pocket. Echo 04/2017 with EF 60 to 65%, trileaflet aortic valve no stenosis no regurgitation, mitral valve structurally normal with trivial regurgitation. Her OBGYN, Dr. Billy Coastaavon, has requested she have an echocardiogram in her first and third trimester for monitoring.  She is presently [redacted] weeks pregnant and very excited.   She denies chest pain, edema, shortness of breath, palpitations.  She was checking her blood pressure routinely at home but her cuff has broken and she has not purchased a new one.  She does work as a Engineer, civil (consulting)nurse in the NICU and checks her blood pressure periodically at work and tells me has been good.  She denies dizziness or lightheadedness.    I have asked her to keep a very careful eye on any symptoms she may notice and keep a log and be sure to report them to Dr. Shella Spearingavoon, Dr. Carmelia RollerWendling, or myself.  EKGs/Labs/Other Studies Reviewed:   The following studies were reviewed today:  Echo 04/2017 Left ventricle:  The cavity size was normal. Wall thickness was normal. Systolic function was normal. The estimated ejection fraction was in the range of 60% to 65%. Wall motion was normal; there were no regional wall motion abnormalities. The transmitral flow pattern was normal. The deceleration time of the early transmitral flow velocity was normal. The pulmonary vein  flow pattern was normal. The tissue Doppler parameters were normal. Left ventricular diastolic function parameters were normal.   ------------------------------------------------------------------- Aortic valve:   Structurally normal valve. Trileaflet; normal thickness leaflets. Cusp separation was normal. Mobility was not restricted.  Doppler:  Transvalvular velocity was within the normal range. There was no stenosis. There was no regurgitation.    ------------------------------------------------------------------- Aorta:  The aorta was normal, not dilated, and non-diseased. Aortic root: The aortic root was normal in size.   ------------------------------------------------------------------- Mitral valve:   Structurally normal valve.   Mobility was not restricted.  Doppler:  Transvalvular velocity was within the normal range. There was no evidence for stenosis. There was trivial regurgitation.    Peak gradient (D): 2 mm Hg.   ------------------------------------------------------------------- Left atrium:  The atrium was normal in size.   ------------------------------------------------------------------- Atrial septum:  The septum was normal.   ------------------------------------------------------------------- Right ventricle:  The cavity size was normal. Wall thickness was normal. Systolic function was normal.   ------------------------------------------------------------------- Pulmonic valve:    Structurally normal valve.   Cusp separation was normal.  Doppler:  Transvalvular velocity was within the normal range. There was no evidence for stenosis. There was no regurgitation.   ------------------------------------------------------------------- Tricuspid valve:   Structurally normal valve.   Leaflet separation was normal.  Doppler:  Transvalvular velocity was within the normal range. There was no regurgitation.   ------------------------------------------------------------------- Pulmonary artery:   The main pulmonary artery was normal-sized. Systolic pressure was within the normal range.   ------------------------------------------------------------------- Right atrium:  The atrium was normal in size.   ------------------------------------------------------------------- Pericardium:  The pericardium was normal in appearance. There was no pericardial effusion.    ------------------------------------------------------------------- Systemic veins: Inferior vena cava: The vessel was normal in size. The respirophasic diameter changes were in the normal range (>= 50%), consistent with normal central venous pressure.  EKG:  EKG is ordered today.  The ekg ordered today demonstrates sinus rhythm rate 98 bpm with no acute ST/T wave changes.  Recent Labs: No results found for requested labs within last 8760 hours.  Recent Lipid Panel No results found for: CHOL, TRIG, HDL, CHOLHDL, VLDL, LDLCALC, LDLDIRECT  Home Medications   Current Meds  Medication Sig  . aspirin 81 MG chewable tablet Chew 81 mg by mouth daily.  Marland Kitchen buPROPion (WELLBUTRIN SR) 200 MG 12 hr tablet TAKE 1 TABLET BY MOUTH TWICE DAILY  . GIANVI 3-0.02 MG tablet   . labetalol (NORMODYNE) 200 MG tablet TAKE 1 TABLET BY MOUTH TWICE DAILY  . Prenatal Vit-Fe Fumarate-FA (MULTIVITAMIN-PRENATAL) 27-0.8 MG TABS tablet Take 1 tablet by mouth daily at 12 noon.     Review of Systems     Review of Systems  Constitution: Negative for chills, fever and malaise/fatigue.  Cardiovascular: Negative for chest pain, dyspnea on exertion, irregular heartbeat, leg swelling, near-syncope and palpitations.  Respiratory: Negative for cough, shortness of breath and wheezing.   Neurological: Negative for dizziness, light-headedness and weakness.   All other systems reviewed and are otherwise negative except as noted above.  Physical Exam    VS:  BP 108/78   Pulse 76   Ht 5' 8.5" (1.74 m)   Wt 222 lb 1.9 oz (100.8 kg)   BMI 33.28 kg/m  , BMI Body mass index is 33.28 kg/m. GEN: Well nourished, well developed, in no acute distress. HEENT: normal. Neck: Supple, no JVD, carotid bruits, or masses. Cardiac: RRR, no murmurs, rubs, or gallops. No clubbing, cyanosis, edema.  Radials/DP/PT 2+ and equal bilaterally.  Respiratory:  Respirations  regular and unlabored, clear to auscultation bilaterally. GI: Soft,  nontender, nondistended, BS + x 4. MS: No deformity or atrophy. Skin: Warm and dry, no rash. Neuro:  Strength and sensation are intact. Psych: Normal affect.  Accessory Clinical Findings    ECG personally reviewed by me today - SR rate 98 bpm - no acute changes.  Assessment & Plan    1. Family history of Marfan's syndrome - Her father has Marfan's syndrome. She had a normal echocardiogram 04/2017 with EF 60-65%, normal AV, normal MV. Plan for echocardiograms during current pregnancy for careful monitoring.   2. [redacted] weeks gestation of pregnancy - She is following closely with her OBGYN Dr. Billy Coast. Plan for echo in 1st trimester (next week) and third trimester (early February) for monitoring of valvular function during pregnancy in the setting of first degree relative with Marfan's Syndrome. She denies chest pain, shortness of breath, edema.   3. PVC/PAC - Noted rare PVC and PAC on Holter 06/2017. She denies palpitations today.   4. Migraine - Noted history. She has been previously changed from propanolol to labetolol by her PCP and OBGYN. Reports migraines have remained well controlled. Periodically checks her blood pressure at work with good results. BP low normal today 108/78. Reports no dizziness, lightheadedness.  Disposition: Echo next week. Echo in 3rd trimester (early February). Follow up as scheduled with Dr. Tomie China in December.   Alver Sorrow, NP 09/17/2018, 2:28 PM

## 2018-09-17 ENCOUNTER — Ambulatory Visit (INDEPENDENT_AMBULATORY_CARE_PROVIDER_SITE_OTHER): Payer: No Typology Code available for payment source | Admitting: Family

## 2018-09-17 ENCOUNTER — Encounter: Payer: Self-pay | Admitting: Family

## 2018-09-17 ENCOUNTER — Other Ambulatory Visit: Payer: Self-pay

## 2018-09-17 VITALS — BP 108/78 | HR 76 | Ht 68.5 in | Wt 222.1 lb

## 2018-09-17 DIAGNOSIS — Z8279 Family history of other congenital malformations, deformations and chromosomal abnormalities: Secondary | ICD-10-CM | POA: Diagnosis not present

## 2018-09-17 DIAGNOSIS — Z3A08 8 weeks gestation of pregnancy: Secondary | ICD-10-CM | POA: Diagnosis not present

## 2018-09-17 NOTE — Addendum Note (Signed)
Addended by: Loel Dubonnet on: 09/17/2018 04:58 PM   Modules accepted: Orders

## 2018-09-17 NOTE — Patient Instructions (Signed)
Medication Instructions:  No medication changes.   If you need a refill on your cardiac medications before your next appointment, please call your pharmacy.   Lab work: No lab work today.   If you have labs (blood work) drawn today and your tests are completely normal, you will receive your results only by: Marland Kitchen MyChart Message (if you have MyChart) OR . A paper copy in the mail If you have any lab test that is abnormal or we need to change your treatment, we will call you to review the results.  Testing/Procedures: Your physician has requested that you have an echocardiogram. Echocardiography is a painless test that uses sound waves to create images of your heart. It provides your doctor with information about the size and shape of your heart and how well your heart's chambers and valves are working. This procedure takes approximately one hour. There are no restrictions for this procedure.    Follow-Up: At [facility], you and your health needs are our priority.  As part of our continuing mission to provide you with exceptional heart care, we have created designated Provider Care Teams.  These Care Teams include your primary Cardiologist (physician) and Advanced Practice Providers (APPs -  Physician Assistants and Nurse Practitioners) who all work together to provide you with the care you need, when you need it. You will need a follow up appointment in 1 years.  You may see Jenean Lindau, MD or another member of our [place] Provider Team in [place]: Jenne Campus, MD . Shirlee More, MD . Berniece Salines, MD . Laurann Montana, NP  Any Other Special Instructions Will Be Listed Below (If Applicable).

## 2018-09-23 ENCOUNTER — Other Ambulatory Visit: Payer: Self-pay

## 2018-09-23 ENCOUNTER — Ambulatory Visit (HOSPITAL_BASED_OUTPATIENT_CLINIC_OR_DEPARTMENT_OTHER)
Admission: RE | Admit: 2018-09-23 | Discharge: 2018-09-23 | Disposition: A | Discharge status: Home / Self Care | Payer: No Typology Code available for payment source | Source: Ambulatory Visit | Attending: Family | Admitting: Family

## 2018-09-23 DIAGNOSIS — Z3A08 8 weeks gestation of pregnancy: Secondary | ICD-10-CM | POA: Insufficient documentation

## 2018-09-23 DIAGNOSIS — Z8279 Family history of other congenital malformations, deformations and chromosomal abnormalities: Secondary | ICD-10-CM | POA: Diagnosis not present

## 2018-09-23 NOTE — Progress Notes (Signed)
  Echocardiogram 2D Echocardiogram has been performed.  Carol Pugh 09/23/2018, 10:00 AM

## 2018-09-27 LAB — OB RESULTS CONSOLE RPR: RPR: NONREACTIVE

## 2018-09-27 LAB — OB RESULTS CONSOLE HEPATITIS B SURFACE ANTIGEN: Hepatitis B Surface Ag: NEGATIVE

## 2018-09-27 LAB — OB RESULTS CONSOLE RUBELLA ANTIBODY, IGM: Rubella: IMMUNE

## 2018-09-27 LAB — OB RESULTS CONSOLE HIV ANTIBODY (ROUTINE TESTING): HIV: NONREACTIVE

## 2018-09-27 LAB — OB RESULTS CONSOLE ANTIBODY SCREEN: Antibody Screen: NEGATIVE

## 2018-09-27 LAB — OB RESULTS CONSOLE GC/CHLAMYDIA
Chlamydia: NEGATIVE
Gonorrhea: NEGATIVE

## 2018-09-27 LAB — OB RESULTS CONSOLE ABO/RH: RH Type: POSITIVE

## 2018-10-15 MED FILL — BUPROPION HCL SR 200 MG TAB: 200 | 30 days supply | Qty: 60 | Fill #3

## 2018-10-15 MED FILL — LABETALOL HCL 200 MG TABLET: 200 | 30 days supply | Qty: 60 | Fill #3

## 2018-11-01 MED FILL — NITROFURANTOIN MONO-MCR 100: 100 | 7 days supply | Qty: 14 | Fill #0

## 2018-11-02 ENCOUNTER — Ambulatory Visit: Payer: No Typology Code available for payment source | Admitting: Family Medicine

## 2018-11-11 ENCOUNTER — Telehealth: Payer: Self-pay

## 2018-11-11 NOTE — Telephone Encounter (Signed)
Copied from [place] [phone]. Topic: General - Other >> Nov 11, 2018  8:45 AM Celene Kras A wrote: Reason for CRM: Pt called and is requesting to have a referral sent over to [facility] urology for kidney stones. Please advise.

## 2018-11-11 NOTE — Telephone Encounter (Signed)
She is due for her CPE so we can discuss there. I don't believe I have evaluated her for that issue before. Ty.

## 2018-11-11 NOTE — Telephone Encounter (Addendum)
Pt was contacted and advised that she is due for CPE. She repeatedly stated that she was in pain and did not see why PCP would not sign off on referral. She was very rude, became agitated and raising her voice while trying to assist. She was advised that if PCP could not see for acute visit to go to ED due to extreme pain she was having. Patient was seen for this 04/08/17 and referral placed to [facility] on 04/08/17 as well.  Please advise what can be done.

## 2018-11-11 NOTE — Telephone Encounter (Signed)
Copied from [place] [phone]. Topic: General - Other >> Nov 11, 2018  8:45 AM Celene Kras A wrote: Reason for CRM: Pt called and is requesting to have a referral sent over to [facility] urology for kidney stones. Please advise. >> Nov 11, 2018  9:34 AM Keene Breath wrote: Patient is calling again regarding a referral.  She would like the nurse to call her back.  She said she has an appt. With neurologist, but the insurance will not cover unless the referral is put in.  Patient is in a lot of pain.  Please call to discuss at [phone]    Separate telephone message in pt's chart regarding this. PCP annotated that pt is due for CPE and could be discussed during visit. Pt was contacted, advised that acute visit may be available with PCP approval. If not, would be best to go to ED.

## 2018-11-12 ENCOUNTER — Other Ambulatory Visit: Payer: Self-pay | Admitting: Family Medicine

## 2018-11-12 DIAGNOSIS — N926 Irregular menstruation, unspecified: Secondary | ICD-10-CM

## 2018-11-12 DIAGNOSIS — Z87442 Personal history of urinary calculi: Secondary | ICD-10-CM

## 2018-11-12 NOTE — Progress Notes (Signed)
am

## 2018-11-12 NOTE — Telephone Encounter (Signed)
Called the patient and she is currently in the urologist office waiting on the doctor. Will schedule with PCP if needed.

## 2018-11-12 NOTE — Telephone Encounter (Signed)
Sorry, didn't know she was actively having a stone. It would be better to have her have some sort of visit today so I can facilitate acute treatment and then we can discuss referral if she doesn't have a urologist. Ty.

## 2018-11-15 MED FILL — CEPHALEXIN 500 MG CAPSULE: 500 | 7 days supply | Qty: 21 | Fill #0

## 2018-11-16 ENCOUNTER — Other Ambulatory Visit: Payer: Self-pay | Admitting: Family Medicine

## 2018-11-16 DIAGNOSIS — F339 Major depressive disorder, recurrent, unspecified: Secondary | ICD-10-CM

## 2018-11-16 DIAGNOSIS — G43809 Other migraine, not intractable, without status migrainosus: Secondary | ICD-10-CM

## 2018-11-16 MED FILL — OXYCODONE-ACETAMINOPHEN 5-3: 5-325 | 5 days supply | Qty: 20 | Fill #0

## 2018-11-16 MED FILL — LABETALOL HCL 200 MG TABS: 200 | 30 days supply | Qty: 60 | Fill #0

## 2018-11-16 MED FILL — BUPROPION HCL SR 200 MG TAB: 200 | 30 days supply | Qty: 60 | Fill #0

## 2018-11-30 ENCOUNTER — Other Ambulatory Visit: Payer: Self-pay

## 2018-12-01 ENCOUNTER — Ambulatory Visit (INDEPENDENT_AMBULATORY_CARE_PROVIDER_SITE_OTHER): Payer: No Typology Code available for payment source | Admitting: Family Medicine

## 2018-12-01 ENCOUNTER — Encounter: Payer: Self-pay | Admitting: Family Medicine

## 2018-12-01 VITALS — BP 102/68 | HR 105 | Temp 97.0°F | Ht 68.0 in | Wt 225.4 lb

## 2018-12-01 DIAGNOSIS — Z0000 Encounter for general adult medical examination without abnormal findings: Secondary | ICD-10-CM

## 2018-12-01 LAB — COMPREHENSIVE METABOLIC PANEL
ALT: 13 U/L (ref 0–35)
AST: 10 U/L (ref 0–37)
Albumin: 3.4 g/dL — ABNORMAL LOW (ref 3.5–5.2)
Alkaline Phosphatase: 57 U/L (ref 39–117)
BUN: 9 mg/dL (ref 6–23)
CO2: 29 mEq/L (ref 19–32)
Calcium: 9 mg/dL (ref 8.4–10.5)
Chloride: 104 mEq/L (ref 96–112)
Creatinine, Ser: 0.6 mg/dL (ref 0.40–1.20)
GFR: 123.32 mL/min (ref >60.00)
Glucose, Bld: 76 mg/dL (ref 70–99)
Potassium: 3.9 mEq/L (ref 3.5–5.1)
Sodium: 138 mEq/L (ref 135–145)
Total Bilirubin: 0.3 mg/dL (ref 0.2–1.2)
Total Protein: 5.6 g/dL — ABNORMAL LOW (ref 6.0–8.3)

## 2018-12-01 LAB — LIPID PANEL
Cholesterol: 194 mg/dL (ref 0–200)
HDL: 60 mg/dL (ref >39.00)
LDL Cholesterol: 95 mg/dL (ref 0–99)
NonHDL: 134.23
Total CHOL/HDL Ratio: 3
Triglycerides: 198 mg/dL — ABNORMAL HIGH (ref 0.0–149.0)
VLDL: 39.6 mg/dL (ref 0.0–40.0)

## 2018-12-01 LAB — CBC
HCT: 40.1 % (ref 36.0–46.0)
Hemoglobin: 13.4 g/dL (ref 12.0–15.0)
MCHC: 33.4 g/dL (ref 30.0–36.0)
MCV: 95.6 fl (ref 78.0–100.0)
Platelets: 291 10*3/uL (ref 150.0–400.0)
RBC: 4.19 Mil/uL (ref 3.87–5.11)
RDW: 13.1 % (ref 11.5–15.5)
WBC: 12 10*3/uL — ABNORMAL HIGH (ref 4.0–10.5)

## 2018-12-01 NOTE — Patient Instructions (Signed)
Give us 2-3 business days to get the results of your labs back.   Keep the diet clean and stay active.  Let us know if you need anything. 

## 2018-12-01 NOTE — Progress Notes (Signed)
Chief Complaint  Patient presents with  . Annual Exam     Well Woman Carol Pugh is here for a complete physical.   Her last physical was >1 year ago.  Current diet: in general, a "healthy" diet. Current exercise: some walking Patient's last menstrual period was 07/26/2018 (approximate).  Seatbelt? Yes  Health Maintenance Pap/HPV- Was scheduled but got pregnant Tetanus- Yes HIV screening- Yes STI screening- Yes  Past Medical History:  Diagnosis Date  . Anxiety   . Depression   . Distal renal tubular acidosis   . Family history of Marfan syndrome   . Headache   . Heart murmur   . History of kidney stones   . Hypertension   . Kidney stones   . Migraine   . PCOS (polycystic ovarian syndrome)      Past Surgical History:  Procedure Laterality Date  . EXTRACORPOREAL SHOCK WAVE LITHOTRIPSY Left 06/11/2017   Procedure: LEFT EXTRACORPOREAL SHOCK WAVE LITHOTRIPSY (ESWL);  Surgeon: Raynelle Bring, MD;  Location: [facility];  Service: Urology;  Laterality: Left;  . KNEE ARTHROSCOPY WITH ANTERIOR CRUCIATE LIGAMENT (ACL) REPAIR WITH HAMSTRING GRAFT Right 10/19/2017   Procedure: RIGHT KNEE ARTHROSCOPY WITH ANTERIOR CRUCIATE LIGAMENT (ACL) REPAIR WITH AUTOGRAFT HAMSTRING;  Surgeon: Hiram Gash, MD;  Location: [facility];  Service: Orthopedics;  Laterality: Right;  . MEDIAL PATELLOFEMORAL LIGAMENT REPAIR Right 10/19/2017   Procedure: MEDIAL PATELLA FEMORAL LIGAMENT RECONSTRUCTION RIGHT KNEE;  Surgeon: Hiram Gash, MD;  Location: [facility];  Service: Orthopedics;  Laterality: Right;  . urinary stent placements      Medications  Current Outpatient Medications on File Prior to Visit  Medication Sig Dispense Refill  . aspirin 81 MG chewable tablet Chew 81 mg by mouth daily.    Marland Kitchen buPROPion (WELLBUTRIN SR) 200 MG 12 hr tablet TAKE 1 TABLET BY MOUTH TWICE DAILY 60 tablet 3  . Butalbital-APAP-Caffeine 50-300-40 MG CAPS Take 1 capsule by mouth as needed.    .  labetalol (NORMODYNE) 200 MG tablet TAKE 1 TABLET BY MOUTH TWICE DAILY 60 tablet 3  . Prenatal Vit-Fe Fumarate-FA (MULTIVITAMIN-PRENATAL) 27-0.8 MG TABS tablet Take 1 tablet by mouth daily at 12 noon.    Marland Kitchen oxyCODONE-acetaminophen (PERCOCET/ROXICET) 5-325 MG tablet Take 1 tablet by mouth as needed for severe pain (kidney stones).     Allergies Allergies  Allergen Reactions  . Bee Venom     Review of Systems: Constitutional:  no unexpected weight changes Eye:  no recent significant change in vision Ear/Nose/Mouth/Throat:  Ears:  no tinnitus or vertigo and no recent change in hearing Nose/Mouth/Throat:  no complaints of nasal congestion, no sore throat Cardiovascular: no chest pain Respiratory:  no cough and no shortness of breath Gastrointestinal:  no abdominal pain, no change in bowel habits GU:  Female: negative for dysuria or pelvic pain Musculoskeletal/Extremities:  no pain of the joints Integumentary (Skin/Breast):  no abnormal skin lesions reported Neurologic:  no headaches Endocrine:  denies fatigue Hematologic/Lymphatic:  No areas of easy bleeding  Exam BP 102/68 (BP Location: Left Arm, Patient Position: Sitting, Cuff Size: Normal)   Pulse (!) 105   Temp (!) 97 F (36.1 C) (Temporal)   Ht 5' 8"  (1.727 m)   Wt 225 lb 6 oz (102.2 kg)   LMP 07/26/2018 (Approximate)   SpO2 98%   BMI 34.27 kg/m  General:  well developed, well nourished, in no apparent distress Skin:  no significant moles, warts, or growths Head:  no masses,  lesions, or tenderness Eyes:  pupils equal and round, sclera anicteric without injection Ears:  canals without lesions, TMs shiny without retraction, no obvious effusion, no erythema Nose:  nares patent, septum midline, mucosa normal, and no drainage or sinus tenderness Throat/Pharynx:  lips and gingiva without lesion; tongue and uvula midline; non-inflamed pharynx; no exudates or postnasal drainage Neck: neck supple without adenopathy, thyromegaly, or  masses Lungs:  clear to auscultation, breath sounds equal bilaterally, no respiratory distress Cardio:  regular rate and rhythm, no bruits, no LE edema Abdomen:  abdomen soft, nontender; bowel sounds normal; gravid uterus, otherwise no masses or organomegaly Genital: Defer to GYN Musculoskeletal:  symmetrical muscle groups noted without atrophy or deformity Extremities:  no clubbing, cyanosis, or edema, no deformities, no skin discoloration Neuro:  gait normal; deep tendon reflexes normal and symmetric Psych: well oriented with normal range of affect and appropriate judgment/insight  Assessment and Plan  Well adult exam - Plan: CBC, Comp Met (CMET), Lipid Profile   Well 23 y.o. female. Counseled on diet and exercise. 18 weeks into pregnancy. Other orders as above. Follow up in 6 mo or prn. The patient voiced understanding and agreement to the plan.  [place], DO 12/01/18 12:31 PM

## 2018-12-30 MED FILL — LABETALOL HCL 200 MG TABS: 200 | 30 days supply | Qty: 60 | Fill #1

## 2018-12-30 MED FILL — BUPROPION HCL SR 200 MG TAB: 200 | 30 days supply | Qty: 60 | Fill #1

## 2019-01-03 ENCOUNTER — Other Ambulatory Visit: Payer: Self-pay

## 2019-01-03 ENCOUNTER — Telehealth (INDEPENDENT_AMBULATORY_CARE_PROVIDER_SITE_OTHER): Payer: No Typology Code available for payment source | Admitting: Cardiology

## 2019-01-03 ENCOUNTER — Encounter: Payer: Self-pay | Admitting: Cardiology

## 2019-01-03 VITALS — BP 106/76 | HR 72 | Ht 68.0 in | Wt 224.0 lb

## 2019-01-03 DIAGNOSIS — I10 Essential (primary) hypertension: Secondary | ICD-10-CM

## 2019-01-03 NOTE — Progress Notes (Signed)
Virtual Visit via Video Note   This visit type was conducted due to national recommendations for restrictions regarding the COVID-19 Pandemic (e.g. social distancing) in an effort to limit this patient's exposure and mitigate transmission in our community.  Due to her co-morbid illnesses, this patient is at least at moderate risk for complications without adequate follow up.  This format is felt to be most appropriate for this patient at this time.  All issues noted in this document were discussed and addressed.  A limited physical exam was performed with this format.  Please refer to the patient's chart for her consent to telehealth for [facility].   Date:  01/03/2019   ID:  Carol Pugh, DOB 03/18/1995, MRN [id number]  Patient Location: Home Provider Location: Office  PCP:  Carol Pal, DO  Cardiologist:  Jenean Lindau, MD  Electrophysiologist:  None   Evaluation Performed:  Follow-Up Visit  Chief Complaint: Essential hypertension  History of Present Illness:    Carol Pugh is a 23 y.o. female with past medical history of hypertension.  She is not pregnant.  She denies any problems at this time and takes care of activities of daily living.  No chest pain orthopnea or PND.  She is a Marine scientist by profession.  She mentions to me that her blood pressure is very optimal and this is followed in our very regularly by obstetrician.  At the time of my evaluation, the patient is alert awake oriented and in no distress.  The patient does not have symptoms concerning for COVID-19 infection (fever, chills, cough, or new shortness of breath).    Past Medical History:  Diagnosis Date  . Anxiety   . Depression   . Distal renal tubular acidosis   . Family history of Marfan syndrome   . Headache   . Heart murmur   . History of kidney stones   . Hypertension   . Kidney stones   . Migraine   . PCOS (polycystic ovarian syndrome)    Past Surgical History:  Procedure  Laterality Date  . EXTRACORPOREAL SHOCK WAVE LITHOTRIPSY Left 06/11/2017   Procedure: LEFT EXTRACORPOREAL SHOCK WAVE LITHOTRIPSY (ESWL);  Surgeon: Raynelle Bring, MD;  Location: [facility];  Service: Urology;  Laterality: Left;  . KNEE ARTHROSCOPY WITH ANTERIOR CRUCIATE LIGAMENT (ACL) REPAIR WITH HAMSTRING GRAFT Right 10/19/2017   Procedure: RIGHT KNEE ARTHROSCOPY WITH ANTERIOR CRUCIATE LIGAMENT (ACL) REPAIR WITH AUTOGRAFT HAMSTRING;  Surgeon: Hiram Gash, MD;  Location: [place];  Service: Orthopedics;  Laterality: Right;  . MEDIAL PATELLOFEMORAL LIGAMENT REPAIR Right 10/19/2017   Procedure: MEDIAL PATELLA FEMORAL LIGAMENT RECONSTRUCTION RIGHT KNEE;  Surgeon: Hiram Gash, MD;  Location: [place];  Service: Orthopedics;  Laterality: Right;  . urinary stent placements       Current Meds  Medication Sig  . aspirin 81 MG chewable tablet Chew 81 mg by mouth daily.  Marland Kitchen buPROPion (WELLBUTRIN SR) 200 MG 12 hr tablet TAKE 1 TABLET BY MOUTH TWICE DAILY  . EPINEPHrine 0.3 mg/0.3 mL IJ SOAJ injection Inject into the muscle.  . labetalol (NORMODYNE) 200 MG tablet TAKE 1 TABLET BY MOUTH TWICE DAILY  . Prenatal Vit-Fe Fumarate-FA (MULTIVITAMIN-PRENATAL) 27-0.8 MG TABS tablet Take 1 tablet by mouth daily at 12 noon.     Allergies:   Bee venom   Social History   Tobacco Use  . Smoking status: Former Smoker    Packs/day: 0.25    Years: 5.00  Pack years: 1.25    Types: Cigarettes  . Smokeless tobacco: Never Used  Substance Use Topics  . Alcohol use: Not Currently  . Drug use: Never     Family Hx: The patient's family history includes Marfan syndrome in her father.  ROS:   Please see the history of present illness.    As mentioned above  All other systems reviewed and are negative.   Prior CV studies:   The following studies were reviewed today:  Results of echocardiogram discussed with the patient at length.  Labs/Other Tests and Data Reviewed:     EKG:  EKG done in the past was reviewed  Recent Labs: 12/01/2018: ALT 13; BUN 9; Creatinine, Ser 0.60; Hemoglobin 13.4; Platelets 291.0; Potassium 3.9; Sodium 138   Recent Lipid Panel Lab Results  Component Value Date/Time   CHOL 194 12/01/2018 10:11 AM   TRIG 198.0 (H) 12/01/2018 10:11 AM   HDL 60.00 12/01/2018 10:11 AM   CHOLHDL 3 12/01/2018 10:11 AM   LDLCALC 95 12/01/2018 10:11 AM    Wt Readings from Last 3 Encounters:  01/03/19 224 lb (101.6 kg)  12/01/18 225 lb 6 oz (102.2 kg)  09/17/18 222 lb 1.9 oz (100.8 kg)     Objective:    Vital Signs:  BP 106/76 Comment: unable to assess BP at home  Pulse 72   Ht 5\' 8"  (1.727 m)   Wt 224 lb (101.6 kg)   LMP 07/26/2018 (Approximate)   BMI 34.06 kg/m    VITAL SIGNS:  reviewed  ASSESSMENT & PLAN:    1. Essential hypertension: Patient is pregnant.  Blood pressures are stable she is continue take to take labetalol.  Diet was discussed lifestyle modification was also discussed she is happy that her blood pressures are stable and in view of her history she was monitored very closely by obstetrician.  She is a 07/28/2018 by profession and she keeps a close monitor on herself to.  She will be seen in follow-up appointment on a as needed basis as she is seeing her obstetrician regularly for blood pressure issues.  She knows to call me if she needs any quick evaluation in the future.  COVID-19 Education: The signs and symptoms of COVID-19 were discussed with the patient and how to seek care for testing (follow up with PCP or arrange E-visit).  The importance of social distancing was discussed today.  Time:   Today, I have spent 15 minutes with the patient with telehealth technology discussing the above problems.     Medication Adjustments/Labs and Tests Ordered: Current medicines are reviewed at length with the patient today.  Concerns regarding medicines are outlined above.   Tests Ordered: No orders of the defined types were placed  in this encounter.   Medication Changes: No orders of the defined types were placed in this encounter.   Follow Up:  -prn  Signed, Engineer, civil (consulting), MD  01/03/2019 10:08 AM    [facility]

## 2019-01-03 NOTE — Patient Instructions (Signed)
Medication Instructions:  Your physician recommends that you continue on your current medications as directed. Please refer to the Current Medication list given to you today.  *If you need a refill on your cardiac medications before your next appointment, please call your pharmacy*  Lab Work: NONE If you have labs (blood work) drawn today and your tests are completely normal, you will receive your results only by: Marland Kitchen MyChart Message (if you have MyChart) OR . A paper copy in the mail If you have any lab test that is abnormal or we need to change your treatment, we will call you to review the results.  Testing/Procedures: NONE  Follow-Up: At [facility] you and your health needs are our priority.  As part of our continuing mission to provide you with exceptional heart care, we have created designated Provider Care Teams.  These Care Teams include your primary Cardiologist (physician) and Advanced Practice Providers (APPs -  Physician Assistants and Nurse Practitioners) who all work together to provide you with the care you need, when you need it.  Your next appointment:   As needed  The format for your next appointment:   In Person

## 2019-01-12 ENCOUNTER — Telehealth: Payer: No Typology Code available for payment source | Admitting: Cardiology

## 2019-01-14 NOTE — L&D Delivery Note (Signed)
Delivery Note At 6:31 PM a viable and healthy female was delivered via Vaginal, Spontaneous (Presentation: Right Occiput Anterior).  APGAR: 8, 9; weight  .   Placenta status: Spontaneous;Expressed, Intact.  Cord: 3 vessels with the following complications: None, loose Cooper x one reduced  Cord pH: na  Anesthesia: Epidural Episiotomy: None Lacerations: 2nd degree;Vaginal Suture Repair: 2.0 vicryl rapide Est. Blood Loss (mL): 150  Mom to postpartum.  Baby to Couplet care / Skin to Skin.  Priscilla Kirstein J 04/12/2019, 7:29 PM

## 2019-02-01 MED FILL — BUPROPION HCL SR 200 MG TAB: 200 | 30 days supply | Qty: 60 | Fill #2

## 2019-02-01 MED FILL — LABETALOL HCL 200 MG TABS: 200 | 30 days supply | Qty: 60 | Fill #2

## 2019-02-22 MED FILL — ONDANSETRON HCL 4 MG TABLET: 4 | 8 days supply | Qty: 24 | Fill #0

## 2019-03-01 ENCOUNTER — Ambulatory Visit: Payer: No Typology Code available for payment source | Admitting: Family Medicine

## 2019-03-04 MED FILL — LABETALOL HCL 200 MG TABS: 200 | 30 days supply | Qty: 60 | Fill #3

## 2019-03-04 MED FILL — BUPROPION HCL SR 200 MG TAB: 200 | 30 days supply | Qty: 60 | Fill #3

## 2019-03-04 MED FILL — NITROFURANTOIN MONO-MCR 100: 100 | 7 days supply | Qty: 14 | Fill #0

## 2019-03-07 ENCOUNTER — Ambulatory Visit (HOSPITAL_BASED_OUTPATIENT_CLINIC_OR_DEPARTMENT_OTHER): Admission: RE | Admit: 2019-03-07 | Payer: No Typology Code available for payment source | Source: Ambulatory Visit

## 2019-03-24 MED FILL — ONDANSETRON HCL 4 MG TABLET: 4 | 8 days supply | Qty: 24 | Fill #0

## 2019-03-29 ENCOUNTER — Inpatient Hospital Stay (HOSPITAL_COMMUNITY)
Admission: AD | Admit: 2019-03-29 | Discharge: 2019-03-29 | Disposition: A | Discharge status: Home / Self Care | Payer: No Typology Code available for payment source | Source: Ambulatory Visit | Attending: Obstetrics and Gynecology | Admitting: Obstetrics and Gynecology

## 2019-03-29 ENCOUNTER — Encounter (HOSPITAL_COMMUNITY): Payer: Self-pay | Admitting: *Deleted

## 2019-03-29 ENCOUNTER — Inpatient Hospital Stay (HOSPITAL_COMMUNITY): Payer: No Typology Code available for payment source

## 2019-03-29 DIAGNOSIS — O99891 Other specified diseases and conditions complicating pregnancy: Secondary | ICD-10-CM | POA: Diagnosis not present

## 2019-03-29 DIAGNOSIS — N1330 Unspecified hydronephrosis: Secondary | ICD-10-CM | POA: Diagnosis not present

## 2019-03-29 DIAGNOSIS — Z3A35 35 weeks gestation of pregnancy: Secondary | ICD-10-CM | POA: Insufficient documentation

## 2019-03-29 DIAGNOSIS — M549 Dorsalgia, unspecified: Secondary | ICD-10-CM | POA: Diagnosis present

## 2019-03-29 DIAGNOSIS — O26833 Pregnancy related renal disease, third trimester: Secondary | ICD-10-CM

## 2019-03-29 DIAGNOSIS — Z87442 Personal history of urinary calculi: Secondary | ICD-10-CM | POA: Insufficient documentation

## 2019-03-29 DIAGNOSIS — N200 Calculus of kidney: Secondary | ICD-10-CM

## 2019-03-29 DIAGNOSIS — Z87891 Personal history of nicotine dependence: Secondary | ICD-10-CM | POA: Insufficient documentation

## 2019-03-29 DIAGNOSIS — Z3689 Encounter for other specified antenatal screening: Secondary | ICD-10-CM

## 2019-03-29 LAB — URINALYSIS, ROUTINE W REFLEX MICROSCOPIC
Bacteria, UA: NONE SEEN
Bilirubin Urine: NEGATIVE
Glucose, UA: NEGATIVE mg/dL
Ketones, ur: NEGATIVE mg/dL
Nitrite: NEGATIVE
Protein, ur: NEGATIVE mg/dL
RBC / HPF: >50 RBC/hpf — ABNORMAL HIGH (ref 0–5)
Specific Gravity, Urine: 1.015 (ref 1.005–1.030)
pH: 7 (ref 5.0–8.0)

## 2019-03-29 MED ORDER — TAMSULOSIN HCL 0.4 MG PO CAPS: 0.4000 mg | ORAL_CAPSULE | Freq: Every day | ORAL | 0 refills | Status: DC

## 2019-03-29 MED ORDER — LACTATED RINGERS IV BOLUS
1000.0000 mL | Freq: Once | INTRAVENOUS | Status: AC
  Administered 2019-03-29: 1000 mL via INTRAVENOUS

## 2019-03-29 MED ORDER — HYDROMORPHONE HCL 1 MG/ML IJ SOLN
1.0000 mg | Freq: Once | INTRAMUSCULAR | Status: AC
  Administered 2019-03-29: 21:00:00 1 mg via INTRAVENOUS
  Filled 2019-03-29: qty 1

## 2019-03-29 MED ORDER — PROMETHAZINE HCL 25 MG/ML IJ SOLN
12.5000 mg | Freq: Once | INTRAMUSCULAR | Status: AC
  Administered 2019-03-29: 21:00:00 12.5 mg via INTRAVENOUS
  Filled 2019-03-29: qty 1

## 2019-03-29 NOTE — MAU Note (Addendum)
Pain L side of back for 4hrs. Feels like kidney stones and has hx stones. Hx chronic HTN. Denies vag bleeding or LOF. Took percocet x1 at 1700 and does not help. Has 10stones per last scan. Hematuria tonight

## 2019-03-29 NOTE — Discharge Instructions (Signed)
Dietary Guidelines to Help Prevent Kidney Stones Kidney stones are deposits of minerals and salts that form inside your kidneys. Your risk of developing kidney stones may be greater depending on your diet, your lifestyle, the medicines you take, and whether you have certain medical conditions. Most people can reduce their chances of developing kidney stones by following the instructions below. Depending on your overall health and the type of kidney stones you tend to develop, your dietitian may give you more specific instructions. What are tips for following this plan? Reading food labels  Choose foods with "no salt added" or "low-salt" labels. Limit your sodium intake to less than 1500 mg per day.  Choose foods with calcium for each meal and snack. Try to eat about 300 mg of calcium at each meal. Foods that contain 200-500 mg of calcium per serving include: ? 8 oz (237 ml) of milk, fortified nondairy milk, and fortified fruit juice. ? 8 oz (237 ml) of kefir, yogurt, and soy yogurt. ? 4 oz (118 ml) of tofu. ? 1 oz of cheese. ? 1 cup (300 g) of dried figs. ? 1 cup (91 g) of cooked broccoli. ? 1-3 oz can of sardines or mackerel.  Most people need 1000 to 1500 mg of calcium each day. Talk to your dietitian about how much calcium is recommended for you. Shopping  Buy plenty of fresh fruits and vegetables. Most people do not need to avoid fruits and vegetables, even if they contain nutrients that may contribute to kidney stones.  When shopping for convenience foods, choose: ? Whole pieces of fruit. ? Premade salads with dressing on the side. ? Low-fat fruit and yogurt smoothies.  Avoid buying frozen meals or prepared deli foods.  Look for foods with live cultures, such as yogurt and kefir. Cooking  Do not add salt to food when cooking. Place a salt shaker on the table and allow each person to add his or her own salt to taste.  Use vegetable protein, such as beans, textured vegetable  protein (TVP), or tofu instead of meat in pasta, casseroles, and soups. Meal planning   Eat less salt, if told by your dietitian. To do this: ? Avoid eating processed or premade food. ? Avoid eating fast food.  Eat less animal protein, including cheese, meat, poultry, or fish, if told by your dietitian. To do this: ? Limit the number of times you have meat, poultry, fish, or cheese each week. Eat a diet free of meat at least 2 days a week. ? Eat only one serving each day of meat, poultry, fish, or seafood. ? When you prepare animal protein, cut pieces into small portion sizes. For most meat and fish, one serving is about the size of one deck of cards.  Eat at least 5 servings of fresh fruits and vegetables each day. To do this: ? Keep fruits and vegetables on hand for snacks. ? Eat 1 piece of fruit or a handful of berries with breakfast. ? Have a salad and fruit at lunch. ? Have two kinds of vegetables at dinner.  Limit foods that are high in a substance called oxalate. These include: ? Spinach. ? Rhubarb. ? Beets. ? Potato chips and french fries. ? Nuts.  If you regularly take a diuretic medicine, make sure to eat at least 1-2 fruits or vegetables high in potassium each day. These include: ? Avocado. ? Banana. ? Orange, prune, carrot, or tomato juice. ? Baked potato. ? Cabbage. ? Beans and split   peas. General instructions   Drink enough fluid to keep your urine clear or pale yellow. This is the most important thing you can do.  Talk to your health care provider and dietitian about taking daily supplements. Depending on your health and the cause of your kidney stones, you may be advised: ? Not to take supplements with vitamin C. ? To take a calcium supplement. ? To take a daily probiotic supplement. ? To take other supplements such as magnesium, fish oil, or vitamin B6.  Take all medicines and supplements as told by your health care provider.  Limit alcohol intake to no  more than 1 drink a day for nonpregnant women and 2 drinks a day for men. One drink equals 12 oz of beer, 5 oz of wine, or 1 oz of hard liquor.  Lose weight if told by your health care provider. Work with your dietitian to find strategies and an eating plan that works best for you. What foods are not recommended? Limit your intake of the following foods, or as told by your dietitian. Talk to your dietitian about specific foods you should avoid based on the type of kidney stones and your overall health. Grains Breads. Bagels. Rolls. Baked goods. Salted crackers. Cereal. Pasta. Vegetables Spinach. Rhubarb. Beets. Canned vegetables. Pickles. Olives. Meats and other protein foods Nuts. Nut butters. Large portions of meat, poultry, or fish. Salted or cured meats. Deli meats. Hot dogs. Sausages. Dairy Cheese. Beverages Regular soft drinks. Regular vegetable juice. Seasonings and other foods Seasoning blends with salt. Salad dressings. Canned soups. Soy sauce. Ketchup. Barbecue sauce. Canned pasta sauce. Casseroles. Pizza. Lasagna. Frozen meals. Potato chips. French fries. Summary  You can reduce your risk of kidney stones by making changes to your diet.  The most important thing you can do is drink enough fluid. You should drink enough fluid to keep your urine clear or pale yellow.  Ask your health care provider or dietitian how much protein from animal sources you should eat each day, and also how much salt and calcium you should have each day. This information is not intended to replace advice given to you by your health care provider. Make sure you discuss any questions you have with your health care provider. Document Revised: 04/21/2018 Document Reviewed: 12/11/2015 Elsevier Patient Education  2020 Elsevier Inc.  

## 2019-03-29 NOTE — MAU Provider Note (Signed)
History     CSN: [id number]  Arrival date and time: 03/29/19 1954   First Provider Initiated Contact with Patient 03/29/19 2111      Chief Complaint  Patient presents with  . Back Pain   Carol Pugh is a 24 y.o. G1P0 at [redacted]w[redacted]d who receives care at [facility].  She presents today for Left Flank Pain.  She states she has known kidney stones since November of 2020.  She states she started having pain and noticed blood in her urine around 330pm.  She reports that she tried to take one percocet  At 505pm without relief of her symptoms.  She reports some nausea and vomiting around 530pm.  Patient rates the pain as a 10/10 and reports that it is constant dull sharp pain.  Patient reports that she has passed 2 stones since diagnosis and she states "it's ten in there right now."  Patient states she has rarely been successful at passing stones and has had to have surgery.  She endorses fetal movement and denies vaginal discharge, bleeding, or leaking.    Of note records show that patient  underwent a stent and ESWL in 2017, a  right ureteroscopy and stent in June 2018, and a left ESWL in 2019.     OB History    Gravida  1   Para  0   Term  0   Preterm  0   AB  0   Living  0     SAB  0   TAB  0   Ectopic  0   Multiple  0   Live Births  0           Past Medical History:  Diagnosis Date  . Anxiety   . Depression   . Distal renal tubular acidosis   . Family history of Marfan syndrome   . Headache   . Heart murmur   . History of kidney stones   . Hypertension   . Kidney stones   . Migraine   . PCOS (polycystic ovarian syndrome)     Past Surgical History:  Procedure Laterality Date  . EXTRACORPOREAL SHOCK WAVE LITHOTRIPSY Left 06/11/2017   Procedure: LEFT EXTRACORPOREAL SHOCK WAVE LITHOTRIPSY (ESWL);  Surgeon: Heloise Purpura, MD;  Location[facility];  Service: Urology;  Laterality: Left;  . KNEE ARTHROSCOPY WITH ANTERIOR CRUCIATE LIGAMENT (ACL) REPAIR WITH  HAMSTRING GRAFT Right 10/19/2017   Procedure: RIGHT KNEE ARTHROSCOPY WITH ANTERIOR CRUCIATE LIGAMENT (ACL) REPAIR WITH AUTOGRAFT HAMSTRING;  Surgeon: Bjorn Pippin, MD;  Location: [facility];  Service: Orthopedics;  Laterality: Right;  . MEDIAL PATELLOFEMORAL LIGAMENT REPAIR Right 10/19/2017   Procedure: MEDIAL PATELLA FEMORAL LIGAMENT RECONSTRUCTION RIGHT KNEE;  Surgeon: Bjorn Pippin, MD;  Location: [facility];  Service: Orthopedics;  Laterality: Right;  . urinary stent placements      Family History  Problem Relation Age of Onset  . Marfan syndrome Father     Social History   Tobacco Use  . Smoking status: Former Smoker    Packs/day: 0.25    Years: 5.00    Pack years: 1.25    Types: Cigarettes  . Smokeless tobacco: Never Used  Substance Use Topics  . Alcohol use: Not Currently  . Drug use: Never    Allergies:  Allergies  Allergen Reactions  . Bee Venom Hives and Swelling    Medications Prior to Admission  Medication Sig Dispense Refill Last Dose  . aspirin 81 MG  chewable tablet Chew 81 mg by mouth daily.     Marland Kitchen buPROPion (WELLBUTRIN SR) 200 MG 12 hr tablet TAKE 1 TABLET BY MOUTH TWICE DAILY 60 tablet 3   . Butalbital-APAP-Caffeine 50-300-40 MG CAPS Take 1 capsule by mouth as needed.     Marland Kitchen EPINEPHrine 0.3 mg/0.3 mL IJ SOAJ injection Inject into the muscle.     . labetalol (NORMODYNE) 200 MG tablet TAKE 1 TABLET BY MOUTH TWICE DAILY 60 tablet 3   . oxyCODONE-acetaminophen (PERCOCET/ROXICET) 5-325 MG tablet Take 1 tablet by mouth as needed for severe pain (kidney stones).     . Prenatal Vit-Fe Fumarate-FA (MULTIVITAMIN-PRENATAL) 27-0.8 MG TABS tablet Take 1 tablet by mouth daily at 12 noon.       Review of Systems  Gastrointestinal: Positive for nausea and vomiting.  Genitourinary: Positive for difficulty urinating and dysuria. Negative for vaginal bleeding and vaginal discharge.  Neurological: Negative for dizziness, light-headedness and  headaches.   Physical Exam   Blood pressure (!) 139/100, temperature 97.8 F (36.6 C), resp. rate (!) 22, height 5\' 8"  (1.727 m), weight 106.1 kg, last menstrual period 07/26/2018.  Physical Exam  Constitutional: She is oriented to person, place, and time. She appears well-developed and well-nourished. She appears distressed.  Patient in fetal position.  Wailing intermittently.  Able to answer questions.  HENT:  Head: Normocephalic and atraumatic.  Eyes: Conjunctivae are normal.  Cardiovascular: Normal rate, regular rhythm and normal heart sounds.  Respiratory: Effort normal and breath sounds normal. No respiratory distress.  GI: Soft. Bowel sounds are normal. There is no abdominal tenderness. There is CVA tenderness (Bilaterally).  Gravid--fundal height appears AGA, Soft, NT   Musculoskeletal:        General: No edema. Normal range of motion.     Cervical back: Normal range of motion.  Neurological: She is alert and oriented to person, place, and time.  Skin: Skin is warm and dry.  Psychiatric: She has a normal mood and affect. Her behavior is normal.    Fetal Assessment 130 bpm, Mod Var, -Decels, +Accels Toco: No Ctx Graphed  MAU Course   Results for orders placed or performed during the hospital encounter of 03/29/19 (from the past 24 hour(s))  Urinalysis, Routine w reflex microscopic     Status: Abnormal   Collection Time: 03/29/19  8:15 PM  Result Value Ref Range   Color, Urine YELLOW YELLOW   APPearance HAZY (A) CLEAR   Specific Gravity, Urine 1.015 1.005 - 1.030   pH 7.0 5.0 - 8.0   Glucose, UA NEGATIVE NEGATIVE mg/dL   Hgb urine dipstick LARGE (A) NEGATIVE   Bilirubin Urine NEGATIVE NEGATIVE   Ketones, ur NEGATIVE NEGATIVE mg/dL   Protein, ur NEGATIVE NEGATIVE mg/dL   Nitrite NEGATIVE NEGATIVE   Leukocytes,Ua TRACE (A) NEGATIVE   RBC / HPF >50 (H) 0 - 5 RBC/hpf   WBC, UA 0-5 0 - 5 WBC/hpf   Bacteria, UA NONE SEEN NONE SEEN   Squamous Epithelial / LPF 6-10 0 -  5   Mucus PRESENT    03/31/19 Renal  Result Date: 03/29/2019 CLINICAL DATA:  Hematuria, left back pain, [redacted] weeks pregnant EXAM: RENAL / URINARY TRACT ULTRASOUND COMPLETE COMPARISON:  Renal ultrasound 12/01/2018 FINDINGS: Right Kidney: Renal measurements: 11.3 x 6.6 x 5.7 cm = volume: 222 mL. Moderate right hydronephrosis. There are 2 small echogenic, shadowing calculi are present in the upper pole, largest measuring up to 6 mm in size. No worrisome renal lesion. Left Kidney: Renal  measurements: 13.2 x 6.1 x 5.1 = volume: 215 mL. Moderate left hydronephrosis. No echogenic calculi or worrisome renal lesion. Bladder: Appears normal for degree of bladder distention. Other: None. IMPRESSION: Nonobstructing calculi are present in the upper pole right kidney. There is moderate fairly symmetric bilateral hydronephrosis. May reflect hydronephrosis of pregnancy though a distal obstructing urolith is not entirely excluded particularly given hematuria. Electronically Signed   By: Lovena Le M.D.   On: 03/29/2019 22:04    MDM PE Start IV LR Bolus Labs:UA EFM Pain Mediation Antiemetic Renal US Assessment and Plan  24 year old G1P0  SIUP at 35.1weeks Cat I FT Left Flank Pain H/O Kidney Stones  -Nurse instructed to start IV and fluids prior to full assessment. -POC reviewed. -Exam findings discussed. -Will give dilaudid 1mg  now. -Renal US ordered and to be performed at bedside. -Will await results.    Maryann Conners MSN, CNM 03/29/2019, 9:11 PM   Reassessment (10:28 PM) Kidney Stones Moderate Hydronephrosis-Bilaterally  -Dr. J.Stinson consulted and updated on patient status including review of [name].  Agrees with discharge and advised: *Flomax x 5 days with follow up with primary for continuation if needed. -Provider to bedside and patient appears calm and no longer distressed.  -Patient informed of results and stones seen. -Patient reports she went to bathroom and urine was light pink in  color. -Patient reports she has appt on Friday with primary ob. -Further states that she was given percocet and zofran for usage. -Discussed initiation of flomax and patient states she has taken in the past. -Patient does not have strainer and will be given one. -Patient without further questions or concerns. -Encouraged to call or return to MAU if symptoms worsen or with the onset of new symptoms. -Discharged to home in stable condition.  Maryann Conners MSN, CNM Advanced Practice Provider, [facility]

## 2019-03-30 ENCOUNTER — Ambulatory Visit (HOSPITAL_BASED_OUTPATIENT_CLINIC_OR_DEPARTMENT_OTHER)
Admission: RE | Admit: 2019-03-30 | Discharge: 2019-03-30 | Disposition: A | Discharge status: Home / Self Care | Payer: No Typology Code available for payment source | Source: Ambulatory Visit | Attending: Family | Admitting: Family

## 2019-03-30 ENCOUNTER — Other Ambulatory Visit: Payer: Self-pay

## 2019-03-30 DIAGNOSIS — I10 Essential (primary) hypertension: Secondary | ICD-10-CM | POA: Diagnosis not present

## 2019-03-30 DIAGNOSIS — O163 Unspecified maternal hypertension, third trimester: Secondary | ICD-10-CM | POA: Insufficient documentation

## 2019-03-30 DIAGNOSIS — Z3A08 8 weeks gestation of pregnancy: Secondary | ICD-10-CM

## 2019-03-30 DIAGNOSIS — Z3A00 Weeks of gestation of pregnancy not specified: Secondary | ICD-10-CM | POA: Diagnosis not present

## 2019-03-30 DIAGNOSIS — Z8279 Family history of other congenital malformations, deformations and chromosomal abnormalities: Secondary | ICD-10-CM | POA: Diagnosis not present

## 2019-03-30 MED FILL — TAMSULOSIN HCL 0.4 MG CAP: 0.4 | 5 days supply | Qty: 5 | Fill #0

## 2019-03-30 NOTE — Progress Notes (Signed)
  Echocardiogram 2D Echocardiogram has been performed.   Sinda Du 03/30/2019, 1:57 PM

## 2019-03-31 MED FILL — PROMETHAZINE 25 MG TABLET: 25 | 4 days supply | Qty: 24 | Fill #0

## 2019-04-06 ENCOUNTER — Encounter (HOSPITAL_COMMUNITY): Payer: Self-pay | Admitting: *Deleted

## 2019-04-06 ENCOUNTER — Telehealth (HOSPITAL_COMMUNITY): Payer: Self-pay | Admitting: *Deleted

## 2019-04-06 ENCOUNTER — Other Ambulatory Visit: Payer: Self-pay | Admitting: Family Medicine

## 2019-04-06 DIAGNOSIS — G43809 Other migraine, not intractable, without status migrainosus: Secondary | ICD-10-CM

## 2019-04-06 DIAGNOSIS — F339 Major depressive disorder, recurrent, unspecified: Secondary | ICD-10-CM

## 2019-04-06 MED FILL — LABETALOL HCL 200 MG TABS: 200 | 30 days supply | Qty: 60 | Fill #0

## 2019-04-06 MED FILL — BUPROPION HCL SR 200 MG TAB: 200 | 30 days supply | Qty: 60 | Fill #0

## 2019-04-06 NOTE — Telephone Encounter (Signed)
Preadmission screen  

## 2019-04-07 ENCOUNTER — Other Ambulatory Visit: Payer: Self-pay | Admitting: Obstetrics and Gynecology

## 2019-04-08 ENCOUNTER — Telehealth (HOSPITAL_COMMUNITY): Payer: Self-pay | Admitting: *Deleted

## 2019-04-08 NOTE — Telephone Encounter (Signed)
Preadmission screen  

## 2019-04-09 ENCOUNTER — Other Ambulatory Visit (HOSPITAL_COMMUNITY)
Admission: RE | Admit: 2019-04-09 | Discharge: 2019-04-09 | Disposition: A | Discharge status: Home / Self Care | Payer: No Typology Code available for payment source | Source: Ambulatory Visit | Attending: Obstetrics and Gynecology | Admitting: Obstetrics and Gynecology

## 2019-04-09 NOTE — Progress Notes (Signed)
Patient tested positive for covid 03/07/19 through health at work. Patient within 90 days of positive test, no need for retest per policy.

## 2019-04-11 ENCOUNTER — Other Ambulatory Visit (HOSPITAL_COMMUNITY): Payer: No Typology Code available for payment source

## 2019-04-12 ENCOUNTER — Other Ambulatory Visit: Payer: Self-pay

## 2019-04-12 ENCOUNTER — Inpatient Hospital Stay (HOSPITAL_COMMUNITY)
Admission: AD | Admit: 2019-04-12 | Discharge: 2019-04-13 | DRG: 807 | Disposition: A | Discharge status: Home / Self Care | Payer: No Typology Code available for payment source | Attending: Obstetrics and Gynecology | Admitting: Obstetrics and Gynecology

## 2019-04-12 ENCOUNTER — Inpatient Hospital Stay (HOSPITAL_COMMUNITY): Payer: No Typology Code available for payment source | Admitting: Anesthesiology

## 2019-04-12 ENCOUNTER — Encounter (HOSPITAL_COMMUNITY): Payer: Self-pay | Admitting: Obstetrics and Gynecology

## 2019-04-12 ENCOUNTER — Inpatient Hospital Stay (HOSPITAL_COMMUNITY): Payer: No Typology Code available for payment source

## 2019-04-12 DIAGNOSIS — Z3A37 37 weeks gestation of pregnancy: Secondary | ICD-10-CM

## 2019-04-12 DIAGNOSIS — Z87891 Personal history of nicotine dependence: Secondary | ICD-10-CM

## 2019-04-12 DIAGNOSIS — F419 Anxiety disorder, unspecified: Secondary | ICD-10-CM | POA: Diagnosis present

## 2019-04-12 DIAGNOSIS — O1002 Pre-existing essential hypertension complicating childbirth: Principal | ICD-10-CM | POA: Diagnosis present

## 2019-04-12 DIAGNOSIS — O10919 Unspecified pre-existing hypertension complicating pregnancy, unspecified trimester: Secondary | ICD-10-CM | POA: Diagnosis present

## 2019-04-12 DIAGNOSIS — Z87442 Personal history of urinary calculi: Secondary | ICD-10-CM | POA: Diagnosis not present

## 2019-04-12 DIAGNOSIS — Z3490 Encounter for supervision of normal pregnancy, unspecified, unspecified trimester: Secondary | ICD-10-CM | POA: Diagnosis present

## 2019-04-12 DIAGNOSIS — N209 Urinary calculus, unspecified: Secondary | ICD-10-CM | POA: Diagnosis present

## 2019-04-12 DIAGNOSIS — O99892 Other specified diseases and conditions complicating childbirth: Secondary | ICD-10-CM | POA: Diagnosis present

## 2019-04-12 DIAGNOSIS — O99344 Other mental disorders complicating childbirth: Secondary | ICD-10-CM | POA: Diagnosis present

## 2019-04-12 LAB — CBC
HCT: 38.5 % (ref 36.0–46.0)
Hemoglobin: 12.7 g/dL (ref 12.0–15.0)
MCH: 31.4 pg (ref 26.0–34.0)
MCHC: 33 g/dL (ref 30.0–36.0)
MCV: 95.1 fL (ref 80.0–100.0)
Platelets: 296 10*3/uL (ref 150–400)
RBC: 4.05 MIL/uL (ref 3.87–5.11)
RDW: 13.2 % (ref 11.5–15.5)
WBC: 12.4 10*3/uL — ABNORMAL HIGH (ref 4.0–10.5)
nRBC: 0 % (ref 0.0–0.2)

## 2019-04-12 LAB — COMPREHENSIVE METABOLIC PANEL
ALT: 14 U/L (ref 0–44)
AST: 16 U/L (ref 15–41)
Albumin: 2.6 g/dL — ABNORMAL LOW (ref 3.5–5.0)
Alkaline Phosphatase: 96 U/L (ref 38–126)
Anion gap: 13 (ref 5–15)
BUN: 11 mg/dL (ref 6–20)
CO2: 20 mmol/L — ABNORMAL LOW (ref 22–32)
Calcium: 9 mg/dL (ref 8.9–10.3)
Chloride: 103 mmol/L (ref 98–111)
Creatinine, Ser: 0.67 mg/dL (ref 0.44–1.00)
GFR calc Af Amer: >60 mL/min (ref >60)
GFR calc non Af Amer: >60 mL/min (ref >60)
Glucose, Bld: 80 mg/dL (ref 70–99)
Potassium: 3.8 mmol/L (ref 3.5–5.1)
Sodium: 136 mmol/L (ref 135–145)
Total Bilirubin: 0.7 mg/dL (ref 0.3–1.2)
Total Protein: 5.5 g/dL — ABNORMAL LOW (ref 6.5–8.1)

## 2019-04-12 LAB — PROTEIN / CREATININE RATIO, URINE
Creatinine, Urine: 65.69 mg/dL
Protein Creatinine Ratio: 0.26 mg/mg{Cre} — ABNORMAL HIGH (ref 0.00–0.15)
Total Protein, Urine: 17 mg/dL

## 2019-04-12 LAB — TYPE AND SCREEN
ABO/RH(D): A POS
Antibody Screen: NEGATIVE

## 2019-04-12 LAB — ABO/RH: ABO/RH(D): A POS

## 2019-04-12 LAB — GLUCOSE, CAPILLARY: Glucose-Capillary: 88 mg/dL (ref 70–99)

## 2019-04-12 LAB — RPR: RPR Ser Ql: NONREACTIVE

## 2019-04-12 MED ORDER — OXYTOCIN 40 UNITS IN NORMAL SALINE INFUSION - SIMPLE MED: 2.5000 [IU]/h | INTRAVENOUS | Status: DC

## 2019-04-12 MED ORDER — EPHEDRINE 5 MG/ML INJ: 10.0000 mg | INTRAVENOUS | Status: DC | PRN

## 2019-04-12 MED ORDER — SODIUM CHLORIDE (PF) 0.9 % IJ SOLN
INTRAMUSCULAR | Status: DC | PRN
  Administered 2019-04-12: 12 mL/h via EPIDURAL

## 2019-04-12 MED ORDER — ONDANSETRON HCL 4 MG/2ML IJ SOLN: 4.0000 mg | INTRAMUSCULAR | Status: DC | PRN

## 2019-04-12 MED ORDER — LABETALOL HCL 200 MG PO TABS
200.0000 mg | ORAL_TABLET | Freq: Two times a day (BID) | ORAL | Status: DC
  Administered 2019-04-12 – 2019-04-13 (×2): 200 mg via ORAL
  Filled 2019-04-12 (×2): qty 1

## 2019-04-12 MED ORDER — AMMONIA AROMATIC IN INHA
RESPIRATORY_TRACT | Status: AC
  Filled 2019-04-12: qty 10

## 2019-04-12 MED ORDER — WITCH HAZEL-GLYCERIN EX PADS: 1.0000 "application " | MEDICATED_PAD | CUTANEOUS | Status: DC | PRN

## 2019-04-12 MED ORDER — OXYCODONE-ACETAMINOPHEN 5-325 MG PO TABS: 1.0000 | ORAL_TABLET | ORAL | Status: DC | PRN

## 2019-04-12 MED ORDER — MISOPROSTOL 25 MCG QUARTER TABLET: 25.0000 ug | ORAL_TABLET | ORAL | Status: DC | PRN

## 2019-04-12 MED ORDER — OXYTOCIN BOLUS FROM INFUSION
500.0000 mL | Freq: Once | INTRAVENOUS | Status: AC
  Administered 2019-04-12: 500 mL via INTRAVENOUS

## 2019-04-12 MED ORDER — METHYLERGONOVINE MALEATE 0.2 MG PO TABS: 0.2000 mg | ORAL_TABLET | ORAL | Status: DC | PRN

## 2019-04-12 MED ORDER — LIDOCAINE HCL (PF) 1 % IJ SOLN: 30.0000 mL | INTRAMUSCULAR | Status: DC | PRN

## 2019-04-12 MED ORDER — PRENATAL MULTIVITAMIN CH
1.0000 | ORAL_TABLET | Freq: Every day | ORAL | Status: DC
  Administered 2019-04-13: 1 via ORAL
  Filled 2019-04-12: qty 1

## 2019-04-12 MED ORDER — METHYLERGONOVINE MALEATE 0.2 MG/ML IJ SOLN: 0.2000 mg | INTRAMUSCULAR | Status: DC | PRN

## 2019-04-12 MED ORDER — ACETAMINOPHEN 325 MG PO TABS: 650.0000 mg | ORAL_TABLET | ORAL | Status: DC | PRN

## 2019-04-12 MED ORDER — ZOLPIDEM TARTRATE 5 MG PO TABS: 5.0000 mg | ORAL_TABLET | Freq: Every evening | ORAL | Status: DC | PRN

## 2019-04-12 MED ORDER — SENNOSIDES-DOCUSATE SODIUM 8.6-50 MG PO TABS
2.0000 | ORAL_TABLET | ORAL | Status: DC
  Administered 2019-04-12: 2 via ORAL
  Filled 2019-04-12: qty 2

## 2019-04-12 MED ORDER — ONDANSETRON HCL 4 MG PO TABS: 4.0000 mg | ORAL_TABLET | ORAL | Status: DC | PRN

## 2019-04-12 MED ORDER — SOD CITRATE-CITRIC ACID 500-334 MG/5ML PO SOLN: 30.0000 mL | ORAL | Status: DC | PRN

## 2019-04-12 MED ORDER — TERBUTALINE SULFATE 1 MG/ML IJ SOLN: 0.2500 mg | Freq: Once | INTRAMUSCULAR | Status: DC | PRN

## 2019-04-12 MED ORDER — LACTATED RINGERS IV SOLN
500.0000 mL | INTRAVENOUS | Status: DC | PRN
  Administered 2019-04-12 (×2): 500 mL via INTRAVENOUS

## 2019-04-12 MED ORDER — COCONUT OIL OIL: 1.0000 "application " | TOPICAL_OIL | Status: DC | PRN

## 2019-04-12 MED ORDER — IBUPROFEN 600 MG PO TABS
600.0000 mg | ORAL_TABLET | Freq: Four times a day (QID) | ORAL | Status: DC
  Administered 2019-04-12 – 2019-04-13 (×4): 600 mg via ORAL
  Filled 2019-04-12 (×4): qty 1

## 2019-04-12 MED ORDER — OXYTOCIN 40 UNITS IN NORMAL SALINE INFUSION - SIMPLE MED
1.0000 m[IU]/min | INTRAVENOUS | Status: DC
  Administered 2019-04-12: 2 m[IU]/min via INTRAVENOUS
  Filled 2019-04-12: qty 1000

## 2019-04-12 MED ORDER — LACTATED RINGERS IV SOLN: INTRAVENOUS | Status: DC

## 2019-04-12 MED ORDER — PHENYLEPHRINE 40 MCG/ML (10ML) SYRINGE FOR IV PUSH (FOR BLOOD PRESSURE SUPPORT): 80.0000 ug | PREFILLED_SYRINGE | INTRAVENOUS | Status: DC | PRN

## 2019-04-12 MED ORDER — DIPHENHYDRAMINE HCL 25 MG PO CAPS: 25.0000 mg | ORAL_CAPSULE | Freq: Four times a day (QID) | ORAL | Status: DC | PRN

## 2019-04-12 MED ORDER — DIBUCAINE (PERIANAL) 1 % EX OINT: 1.0000 "application " | TOPICAL_OINTMENT | CUTANEOUS | Status: DC | PRN

## 2019-04-12 MED ORDER — TETANUS-DIPHTH-ACELL PERTUSSIS 5-2.5-18.5 LF-MCG/0.5 IM SUSP: 0.5000 mL | Freq: Once | INTRAMUSCULAR | Status: DC

## 2019-04-12 MED ORDER — LACTATED RINGERS IV SOLN
500.0000 mL | Freq: Once | INTRAVENOUS | Status: AC
  Administered 2019-04-12: 500 mL via INTRAVENOUS

## 2019-04-12 MED ORDER — SIMETHICONE 80 MG PO CHEW: 80.0000 mg | CHEWABLE_TABLET | ORAL | Status: DC | PRN

## 2019-04-12 MED ORDER — OXYCODONE-ACETAMINOPHEN 5-325 MG PO TABS: 2.0000 | ORAL_TABLET | ORAL | Status: DC | PRN

## 2019-04-12 MED ORDER — ONDANSETRON HCL 4 MG/2ML IJ SOLN: 4.0000 mg | Freq: Four times a day (QID) | INTRAMUSCULAR | Status: DC | PRN

## 2019-04-12 MED ORDER — BUPROPION HCL ER (SR) 100 MG PO TB12
200.0000 mg | ORAL_TABLET | Freq: Two times a day (BID) | ORAL | Status: DC
  Administered 2019-04-12 – 2019-04-13 (×2): 200 mg via ORAL
  Filled 2019-04-12 (×3): qty 2

## 2019-04-12 MED ORDER — DIPHENHYDRAMINE HCL 50 MG/ML IJ SOLN: 12.5000 mg | INTRAMUSCULAR | Status: DC | PRN

## 2019-04-12 MED ORDER — LIDOCAINE HCL (PF) 1 % IJ SOLN
INTRAMUSCULAR | Status: DC | PRN
  Administered 2019-04-12 (×2): 4 mL via EPIDURAL

## 2019-04-12 MED ORDER — BENZOCAINE-MENTHOL 20-0.5 % EX AERO
1.0000 "application " | INHALATION_SPRAY | CUTANEOUS | Status: DC | PRN
  Administered 2019-04-12: 1 via TOPICAL
  Filled 2019-04-12: qty 56

## 2019-04-12 MED ORDER — LACTATED RINGERS IV SOLN: 500.0000 mL | Freq: Once | INTRAVENOUS | Status: DC

## 2019-04-12 MED ORDER — FENTANYL-BUPIVACAINE-NACL 0.5-0.125-0.9 MG/250ML-% EP SOLN
12.0000 mL/h | EPIDURAL | Status: DC | PRN
  Filled 2019-04-12: qty 250

## 2019-04-12 NOTE — Progress Notes (Signed)
Assisted patient up to bathroom on stedy. Once patient finished voiding, patient back onto stedy and verbalized she didn't feel well, dizzy, and felt faint. Patient taken back to bed and placed in left lateral position, given a fluid bolus, blood glucose checked (88), and patient able to drink 8oz of apple juice. Patient responded well to interventions.   Lovenia Shuck, RN 04/12/2019 @ 2112

## 2019-04-12 NOTE — Anesthesia Preprocedure Evaluation (Signed)
Anesthesia Evaluation  Patient identified by MRN, date of birth, ID band Patient awake    Reviewed: Allergy & Precautions, Patient's Chart, lab work & pertinent test results  History of Anesthesia Complications Negative for: history of anesthetic complications  Airway Mallampati: II  TM Distance: >3 FB Neck ROM: Full    Dental no notable dental hx.    Pulmonary Patient abstained from smoking., former smoker,    Pulmonary exam normal        Cardiovascular hypertension, Normal cardiovascular exam     Neuro/Psych  Headaches, Anxiety Depression    GI/Hepatic negative GI ROS, Neg liver ROS,   Endo/Other  negative endocrine ROS  Renal/GU negative Renal ROS  negative genitourinary   Musculoskeletal negative musculoskeletal ROS (+)   Abdominal   Peds  Hematology negative hematology ROS (+)   Anesthesia Other Findings Day of surgery medications reviewed with patient.  Reproductive/Obstetrics (+) Pregnancy                             Anesthesia Physical Anesthesia Plan  ASA: II  Anesthesia Plan: Epidural   Post-op Pain Management:    Induction:   PONV Risk Score and Plan: Treatment may vary due to age or medical condition  Airway Management Planned: Natural Airway  Additional Equipment:   Intra-op Plan:   Post-operative Plan:   Informed Consent: I have reviewed the patients History and Physical, chart, labs and discussed the procedure including the risks, benefits and alternatives for the proposed anesthesia with the patient or authorized representative who has indicated his/her understanding and acceptance.       Plan Discussed with:   Anesthesia Plan Comments:         Anesthesia Quick Evaluation

## 2019-04-12 NOTE — H&P (Signed)
Carol Pugh is a 24 y.o. female presenting for IOL due to [facility] and urolithiasis on narcotics. OB History    Gravida  1   Para  0   Term  0   Preterm  0   AB  0   Living  0     SAB  0   TAB  0   Ectopic  0   Multiple  0   Live Births  0          Past Medical History:  Diagnosis Date  . Anxiety   . Depression   . Distal renal tubular acidosis   . Family history of Carol Pugh   . Headache   . Heart murmur   . History of kidney stones   . Hypertension   . Kidney stones   . Migraine   . PCOS (polycystic Carol Pugh)    Past Surgical History:  Procedure Laterality Date  . EXTRACORPOREAL SHOCK WAVE LITHOTRIPSY Left 06/11/2017   Procedure: LEFT EXTRACORPOREAL SHOCK WAVE LITHOTRIPSY (ESWL);  Surgeon: Raynelle Bring, MD;  Location: [facility];  Service: Urology;  Laterality: Left;  . KNEE ARTHROSCOPY WITH ANTERIOR CRUCIATE LIGAMENT (ACL) REPAIR WITH HAMSTRING GRAFT Right 10/19/2017   Procedure: RIGHT KNEE ARTHROSCOPY WITH ANTERIOR CRUCIATE LIGAMENT (ACL) REPAIR WITH AUTOGRAFT HAMSTRING;  Surgeon: Hiram Gash, MD;  Location: [place];  Service: Orthopedics;  Laterality: Right;  . MEDIAL PATELLOFEMORAL LIGAMENT REPAIR Right 10/19/2017   Procedure: MEDIAL PATELLA FEMORAL LIGAMENT RECONSTRUCTION RIGHT KNEE;  Surgeon: Hiram Gash, MD;  Location: [place];  Service: Orthopedics;  Laterality: Right;  . urinary stent placements     Family History: family history includes Carol Pugh in her father; Carol Pugh in her paternal grandmother. Social History:  reports that she has quit smoking. Her smoking use included cigarettes. She has a 1.25 pack-year smoking history. She has never used smokeless tobacco. She reports previous alcohol use. She reports that she does not use drugs.     Maternal Diabetes: No Genetic Screening: Normal Maternal Ultrasounds/Referrals: Normal Fetal Ultrasounds or other Referrals:  None Maternal  Substance Abuse:  No Significant Maternal Medications:  Meds include: Other: wellbutrin, labetalol and percocet Significant Maternal Lab Results:  Group B Strep negative Other Comments:  None  Review of Systems  Constitutional: Negative.   All other systems reviewed and are negative.  Maternal Medical History:  Fetal activity: Perceived fetal activity is normal.   Last perceived fetal movement was within the past hour.    Prenatal complications: PIH.   Prenatal Complications - Diabetes: none.      Blood pressure (!) 100/59, pulse (!) 113, temperature 98.6 F (37 C), temperature source Oral, resp. rate 16, height 5\' 8"  (1.727 m), weight 106.2 kg, last menstrual period 07/26/2018. Maternal Exam:  Uterine Assessment: Contraction strength is mild.  Contraction frequency is irregular.   Abdomen: Patient reports no abdominal tenderness. Fetal presentation: vertex  Introitus: Normal vulva. Normal vagina.  Ferning test: not done.  Nitrazine test: not done. Amniotic fluid character: not assessed.  Pelvis: adequate for delivery.   Cervix: Cervix evaluated by digital exam.     Physical Exam  Constitutional: She is oriented to person, place, and time. She appears well-developed and well-nourished.  HENT:  Head: Normocephalic and atraumatic.  Cardiovascular: Normal rate and regular rhythm.  Respiratory: Effort normal and breath sounds normal.  GI: Soft. Bowel sounds are normal.  Genitourinary:    Vulva, vagina and uterus normal.  Musculoskeletal:        General: Normal range of motion.     Cervical back: Normal range of motion and neck supple.  Neurological: She is oriented to person, place, and time. She has normal reflexes.  Skin: Skin is warm and dry.  Psychiatric: She has a normal mood and affect.    Prenatal labs: ABO, Rh: A/Positive/-- (09/14 0000) Antibody: Negative (09/14 0000) Rubella: Immune (09/14 0000) RPR: Nonreactive (09/14 0000)  HBsAg: Negative (09/14 0000)   HIV: Non-reactive (09/14 0000)  GBS:   neg  Assessment/Plan: 37 wk IUP CHTN Urotlithiasis IOL   Carol Pugh J 04/12/2019, 8:13 AM

## 2019-04-12 NOTE — Anesthesia Procedure Notes (Signed)
Epidural Patient location during procedure: OB Start time: 04/12/2019 1:19 PM End time: 04/12/2019 1:22 PM  Staffing Anesthesiologist: Kaylyn Layer, MD Performed: anesthesiologist   Preanesthetic Checklist Completed: patient identified, IV checked, risks and benefits discussed, monitors and equipment checked, pre-op evaluation and timeout performed  Epidural Patient position: sitting Prep: DuraPrep and site prepped and draped Patient monitoring: continuous pulse ox, blood pressure and heart rate Approach: midline Location: L3-L4 Injection technique: LOR air  Needle:  Needle type: Tuohy  Needle gauge: 17 G Needle length: 9 cm Needle insertion depth: 6 cm Catheter type: closed end flexible Catheter size: 19 Gauge Catheter at skin depth: 11 cm Test dose: negative and Other (1% lidocaine)  Assessment Events: blood not aspirated, injection not painful, no injection resistance, no paresthesia and negative IV test  Additional Notes Patient identified. Risks, benefits, and alternatives discussed with patient including but not limited to bleeding, infection, nerve damage, paralysis, failed block, incomplete pain control, headache, blood pressure changes, nausea, vomiting, reactions to medication, itching, and postpartum back pain. Confirmed with bedside nurse the patient's most recent platelet count. Confirmed with patient that they are not currently taking any anticoagulation, have any bleeding history, or any family history of bleeding disorders. Patient expressed understanding and wished to proceed. All questions were answered. Sterile technique was used throughout the entire procedure. Please see nursing notes for vital signs.   Crisp LOR on first pass. Test dose was given through epidural catheter and negative prior to continuing to dose epidural or start infusion. Warning signs of high block given to the patient including shortness of breath, tingling/numbness in hands, complete  motor block, or any concerning symptoms with instructions to call for help. Patient was given instructions on fall risk and not to get out of bed. All questions and concerns addressed with instructions to call with any issues or inadequate analgesia.  Reason for block:procedure for pain

## 2019-04-12 NOTE — Progress Notes (Signed)
I was asked by Dr. Billy Coast to assess pt. For AROM S:  Reports mild contractions. Discussed AROM with patient and she agrees.  O: Pitocin 12 milliunits   VS: Blood pressure 124/83, pulse 96, temperature 98.6 F (37 C), temperature source Oral, resp. rate 18, height 5\' 8"  (1.727 m), weight 106.2 kg, last menstrual period 07/26/2018.        FHR : baseline 135 bpm / variability  moderate / accelerations +15x15 / no decelerations        Toco: contractions every 2-3 minutes / mild-moderate        Cervix : Dilation: 3 Effacement (%): 80 Cervical Position: Posterior Station: -1 Presentation: Vertex Exam by:: M Ceceilia Cephus CNM        Membranes: AROM, clear fluid  A: Latent labor     CHTN     Urolithiasis      FHR category 1  P: Continue Pitocin augmentation PRN      Epidural upon request     Dr. [phone] updated with status, and will assume care     Billy Coast, MSN, CNM [facility]

## 2019-04-13 DIAGNOSIS — O10919 Unspecified pre-existing hypertension complicating pregnancy, unspecified trimester: Secondary | ICD-10-CM | POA: Diagnosis present

## 2019-04-13 LAB — CBC
HCT: 33.3 % — ABNORMAL LOW (ref 36.0–46.0)
Hemoglobin: 11 g/dL — ABNORMAL LOW (ref 12.0–15.0)
MCH: 31.2 pg (ref 26.0–34.0)
MCHC: 33 g/dL (ref 30.0–36.0)
MCV: 94.3 fL (ref 80.0–100.0)
Platelets: 239 10*3/uL (ref 150–400)
RBC: 3.53 MIL/uL — ABNORMAL LOW (ref 3.87–5.11)
RDW: 13.2 % (ref 11.5–15.5)
WBC: 14.4 10*3/uL — ABNORMAL HIGH (ref 4.0–10.5)
nRBC: 0 % (ref 0.0–0.2)

## 2019-04-13 MED ORDER — COCONUT OIL OIL: 1.0000 "application " | TOPICAL_OIL | 0 refills | Status: DC | PRN

## 2019-04-13 MED ORDER — IBUPROFEN 600 MG PO TABS: 600.0000 mg | ORAL_TABLET | Freq: Four times a day (QID) | ORAL | 0 refills | Status: DC

## 2019-04-13 MED ORDER — BENZOCAINE-MENTHOL 20-0.5 % EX AERO: 1.0000 "application " | INHALATION_SPRAY | CUTANEOUS | Status: DC | PRN

## 2019-04-13 MED ORDER — ACETAMINOPHEN 500 MG PO TABS: 1000.0000 mg | ORAL_TABLET | Freq: Four times a day (QID) | ORAL | 2 refills | Status: AC | PRN

## 2019-04-13 NOTE — Discharge Summary (Signed)
OB Discharge Summary  Patient Name: Carol Pugh DOB: Jan 07, 1996 MRN: [id number]  Date of admission: 04/12/2019 Delivering provider: Olivia Mackie   Date of discharge: 04/13/2019  Admitting diagnosis: Encounter for planned induction of labor [Z34.90] Intrauterine pregnancy: [redacted]w[redacted]d     Secondary diagnosis:Principal Problem:   Postpartum care following vaginal delivery (3/30) Active Problems:   Urolithiasis   Encounter for planned induction of labor   SVD (spontaneous vaginal delivery)   Second degree perineal laceration   Chronic hypertension affecting pregnancy  Additional problems:none     Discharge diagnosis:  Patient Active Problem List   Diagnosis Date Noted  . Chronic hypertension affecting pregnancy 04/13/2019  . Encounter for planned induction of labor 04/12/2019  . Postpartum care following vaginal delivery (3/30) 04/12/2019  . SVD (spontaneous vaginal delivery) 04/12/2019  . Second degree perineal laceration 04/12/2019  . Depression, recurrent (HCC) 01/27/2018  . Urolithiasis 10/09/2017  . Essential hypertension 06/09/2017  . History of kidney stones 04/08/2017  . Family history of Marfan syndrome 04/08/2017  . Migraine 04/08/2017                                                                Post partum procedures:none  Augmentation: AROM and Pitocin Pain control: Epidural  Laceration:2nd degree;Vaginal  Episiotomy:None  Complications: None   Hospital course:  Induction of Labor With Vaginal Delivery   24 y.o. yo G1P1001 at [redacted]w[redacted]d was admitted to the hospital 04/12/2019 for induction of labor.  Indication for induction: chronic hypertension and urolithiasis on narcotics.  Patient had an uncomplicated labor course as follows: Membrane Rupture Time/Date: 11:55 AM ,04/12/2019   Intrapartum Procedures: Episiotomy: None [1]                                         Lacerations:  2nd degree [3];Vaginal [6]  Patient had delivery of a Viable infant.  Information  for the patient's newborn:  Carlethia, Mesquita [[id number]]      04/12/2019  Details of delivery can be found in separate delivery note.  Patient had a routine postpartum course. Patient is discharged home 04/13/19. Close follow up in office in 1 week for BP check and nephrology for kidney stone management.   Physical exam  Vitals:   04/12/19 2230 04/13/19 0230 04/13/19 0530 04/13/19 1041  BP: 126/78 115/67 132/74 118/69  Pulse: (!) 106 80 88 92  Resp: 17 18 18    Temp: 97.7 F (36.5 C) 98.5 F (36.9 C) 97.6 F (36.4 C)   TempSrc: Oral  Oral   SpO2: 97% 100%    Weight:      Height:       General: alert, cooperative and no distress Lochia: appropriate Uterine Fundus: firm Incision: Healing well with no significant drainage DVT Evaluation: No cords or calf tenderness. No significant calf/ankle edema. Labs: Lab Results  Component Value Date   WBC 14.4 (H) 04/13/2019   HGB 11.0 (L) 04/13/2019   HCT 33.3 (L) 04/13/2019   MCV 94.3 04/13/2019   PLT 239 04/13/2019   CMP Latest Ref Rng & Units 04/12/2019  Glucose 70 - 99 mg/dL 80  BUN 6 - 20 mg/dL 11  Creatinine 04/14/2019 -  1.00 mg/dL 0.67  Sodium 135 - 145 mmol/L 136  Potassium 3.5 - 5.1 mmol/L 3.8  Chloride 98 - 111 mmol/L 103  CO2 22 - 32 mmol/L 20(L)  Calcium 8.9 - 10.3 mg/dL 9.0  Total Protein 6.5 - 8.1 g/dL 5.5(L)  Total Bilirubin 0.3 - 1.2 mg/dL 0.7  Alkaline Phos 38 - 126 U/L 96  AST 15 - 41 U/L 16  ALT 0 - 44 U/L 14   Edinburgh Postnatal Depression Scale Screening Tool 04/13/2019  I have been able to laugh and see the funny side of things. (No Data)   Vaccines: TDaP UTD         Flu    UTD  Discharge instruction: per After Visit Summary and "Baby and Me Booklet".  After Visit Meds:  Allergies as of 04/13/2019      Reactions   Bee Venom Hives, Swelling      Medication List    STOP taking these medications   aspirin 81 MG chewable tablet     TAKE these medications   acetaminophen 500 MG tablet Commonly  known as: TYLENOL Take 2 tablets (1,000 mg total) by mouth every 6 (six) hours as needed.   benzocaine-Menthol 20-0.5 % Aero Commonly known as: DERMOPLAST Apply 1 application topically as needed for irritation (perineal discomfort).   buPROPion 200 MG 12 hr tablet Commonly known as: WELLBUTRIN SR TAKE 1 TABLET BY MOUTH TWICE DAILY   Butalbital-APAP-Caffeine 50-300-40 MG Caps Take 1 capsule by mouth as needed.   coconut oil Oil Apply 1 application topically as needed.   EPINEPHrine 0.3 mg/0.3 mL Soaj injection Commonly known as: EPI-PEN Inject into the muscle.   ibuprofen 600 MG tablet Commonly known as: ADVIL Take 1 tablet (600 mg total) by mouth every 6 (six) hours.   labetalol 200 MG tablet Commonly known as: NORMODYNE TAKE 1 TABLET BY MOUTH TWICE DAILY   multivitamin-prenatal 27-0.8 MG Tabs tablet Take 1 tablet by mouth daily at 12 noon.   oxyCODONE-acetaminophen 5-325 MG tablet Commonly known as: PERCOCET/ROXICET Take 1 tablet by mouth as needed for severe pain (kidney stones).   tamsulosin 0.4 MG Caps capsule Commonly known as: Flomax Take 1 capsule (0.4 mg total) by mouth daily.            Discharge Care Instructions  (From admission, onward)         Start     Ordered   04/13/19 0000  Discharge wound care:    Comments: Sitz baths 2 times /day with warm water x 1 week. May add herbals: 1 ounce dried comfrey leaf* 1 ounce calendula flowers 1 ounce lavender flowers 1/2 ounce dried uva ursi leaves 1/2 ounce witch hazel blossoms (if you can find them) 1/2 ounce dried sage leaf 1/2 cup sea salt Directions: Bring 2 quarts of water to a boil. Turn off heat, and place 1 ounce (approximately 1 large handful) of the above mixed herbs (not the salt) into the pot. Steep, covered, for 30 minutes.  Strain the liquid well with a fine mesh strainer, and discard the herb material. Add 2 quarts of liquid to the tub, along with the 1/2 cup of salt. This medicinal  liquid can also be made into compresses and peri-rinses.   04/13/19 1044          Diet: routine diet  Activity: Advance as tolerated. Pelvic rest for 6 weeks.   Postpartum contraception: Not Discussed  Newborn Data: Live born female  Birth Weight: 6 lb 7.4  oz (2931 g) APGAR: 8, 9  Newborn Delivery   Birth date/time: 04/12/2019 18:31:00 Delivery type: Vaginal, Spontaneous      named Brantley Baby Feeding: Breast Disposition:home with mother   Delivery Report:   Review the Delivery Report for details.    Follow up: Follow-up Information    Olivia Mackie, MD. Schedule an appointment as soon as possible for a visit.   Specialty: Obstetrics and Gynecology Why: BP check Contact information: [address] [place] [address] [phone]             Signed: Neta Mends, CNM, MSN 04/13/2019, 1:25 PM

## 2019-04-13 NOTE — Progress Notes (Signed)
Patient ID: BRUNA DILLS, female   DOB: [birth year], 24 y.o.   MRN: [id number] PPD # 1 S/P NSVD  Live born female  Birth Weight: 6 lb 7.4 oz (2931 g) APGAR: 8, 9  Newborn Delivery   Birth date/time: 04/12/2019 18:31:00 Delivery type: Vaginal, Spontaneous     Baby name: Brantley Delivering provider: Olivia Mackie  Episiotomy:None   Lacerations:2nd degree;Vaginal   circumcision completed  Feeding: breast  Pain control at delivery: Epidural   S:  Reports feeling well, desires early DC             Tolerating po/ No nausea or vomiting             Bleeding is light             Pain controlled with acetaminophen and ibuprofen (OTC)             Up ad lib / ambulatory / voiding without difficulties   O:  A & O x 3, in no apparent distress              VS:  Vitals:   04/12/19 2130 04/12/19 2230 04/13/19 0230 04/13/19 0530  BP: 123/79 126/78 115/67 132/74  Pulse: (!) 106 (!) 106 80 88  Resp: 17 17 18 18   Temp: 98.6 F (37 C) 97.7 F (36.5 C) 98.5 F (36.9 C) 97.6 F (36.4 C)  TempSrc: Oral Oral  Oral  SpO2: 100% 97% 100%   Weight:      Height:        LABS:  Recent Labs    04/12/19 0810 04/13/19 0559  WBC 12.4* 14.4*  HGB 12.7 11.0*  HCT 38.5 33.3*  PLT 296 239   CMP     Component Value Date/Time   NA 136 04/12/2019 0810   K 3.8 04/12/2019 0810   CL 103 04/12/2019 0810   CO2 20 (L) 04/12/2019 0810   GLUCOSE 80 04/12/2019 0810   BUN 11 04/12/2019 0810   CREATININE 0.67 04/12/2019 0810   CALCIUM 9.0 04/12/2019 0810   PROT 5.5 (L) 04/12/2019 0810   ALBUMIN 2.6 (L) 04/12/2019 0810   AST 16 04/12/2019 0810   ALT 14 04/12/2019 0810   ALKPHOS 96 04/12/2019 0810   BILITOT 0.7 04/12/2019 0810   GFRNONAA >60 04/12/2019 0810   GFRAA >60 04/12/2019 0810    Blood type: --/--/A POS, A POS Performed at [facility], [address], [place], [place]  [phone])  Rubella: Immune (09/14 0000)   I&O: I/O last 3 completed shifts: In: -  Out: 1775  [Urine:1625; Blood:150]          No intake/output data recorded.  Vaccines: TDaP UTD         Flu    UTD   Lungs: Clear and unlabored  Heart: regular rate and rhythm / no murmurs  Abdomen: soft, non-tender, non-distended             Fundus: firm, non-tender, U-1  Perineum: repair intact, no edema  Lochia: small  Extremities: no edema, no calf pain or tenderness    A/P: PPD # 1 23 y.o., G1P1001   Principal Problem:   Postpartum care following vaginal delivery (3/30) Active Problems:   Urolithiasis   Encounter for planned induction of labor   SVD (spontaneous vaginal delivery)   Second degree perineal laceration   Chronic hypertension affecting pregnancy  - labs stable, no s/sx of superimposed PEC, BP stable on Labetalol 200  mg BID, continue current regimen    Anxiety  - stable on Wellbutrin 200 mg BID   Doing well - stable status  Routine post partum orders  DC home today, may remain boarding status if baby not discharged today  F/U [facility]OB office in 1 week for BP check    Juliene Pina, MSN, CNM 04/13/2019, 8:59 AM

## 2019-04-13 NOTE — Lactation Note (Addendum)
This note was copied from a baby's chart. Lactation Consultation Note  Patient Name: Carol Pugh [id number]'G Date: 04/13/2019 Reason for consult: Initial assessment;Primapara;1st time breastfeeding;Early term 37-38.6wks;Infant weight loss;Other (Comment)  Baby is 20 hours old  As LC entered the room baby latched on the right breast with depth and  Swallows noted. LC reviewed with mom breast compressions and swallows  Increased.  LC reviewed potential feeding behaviors for an early term infant and the importance of STS feedings until the baby is back to birth weight gaining steadily and can stay awake for majority of feeding.  LC reviewed the importance of obtaining depth at the breast and hearing swallows with the feeding. To be consider a good feeding baby needs to feed at least 10 mins or greater with swallows.  Mom denies soreness , sore nipple and engorgement prevention and tx reviewed.  Mom has a hand pump and the LC obtained her DEBP Medela UMR benefits pump.  LC recommended due to mom history of PCOS to add post pumping after 4=5 feedings for 10 -15 mins both breast to establish a good milk supply.  Per mom has some  breast changes , areola darkened, and breast changed in size alittle.  LC also recommended coming back for [facility] O/P appt in 7 days and  LC offered to  Request it in Epic basket for the clinic to call mom for next Thursday check on milk supply. Mom receptive and aware she will receive a phone call from the clinic .  LC explored another option with mom - prior to latch warm moist heat , breast massage, hand express ( per mom which she has been doing ) and pre - pump with the hand pump to prime the milk ducts. Mom receptive to recommendations by LC .     Maternal Data    Feeding Feeding Type: (latched with depth)  LATCH Score Latch: (latched with depth)  Audible Swallowing: (increased swallows with depth)  Type of Nipple: (nipple well rounded when baby  released)  Comfort (Breast/Nipple): (per mom  comfortable)  Hold (Positioning): (mom latched by her self)  LATCH Score: 8  Interventions Interventions: Breast feeding basics reviewed  Lactation Tools Discussed/Used Tools: Pump WIC Program: No   Consult Status Consult Status: Complete    Matilde Sprang Alura Olveda 04/13/2019, 3:30 PM

## 2019-04-13 NOTE — Progress Notes (Signed)
MOB was referred for history of depression/anxiety. * Referral screened out by Clinical Social Worker because none of the following criteria appear to apply: ~ History of anxiety/depression during this pregnancy, or of post-partum depression following prior delivery. ~ Diagnosis of anxiety and/or depression within last 3 years OR * MOB's symptoms currently being treated with medication and/or therapy. Per further chart review, MOB on Wellbutrin for depression/anxiety.    Please contact the Clinical Social Worker if needs arise, by MOB request, or if MOB scores greater than 9/yes to question 10 on Edinburgh Postpartum Depression Screen.    Carol Pugh, MSW, LCSW [facility] [phone]  

## 2019-04-13 NOTE — Anesthesia Postprocedure Evaluation (Signed)
Anesthesia Post Note  Patient: Carol Pugh  Procedure(s) Performed: AN AD HOC LABOR EPIDURAL     Patient location during evaluation: Mother Baby Anesthesia Type: Epidural Level of consciousness: awake Pain management: satisfactory to patient Vital Signs Assessment: post-procedure vital signs reviewed and stable Respiratory status: spontaneous breathing Cardiovascular status: stable Anesthetic complications: no    Last Vitals:  Vitals:   04/13/19 0230 04/13/19 0530  BP: 115/67 132/74  Pulse: 80 88  Resp: 18 18  Temp: 36.9 C 36.4 C  SpO2: 100%     Last Pain:  Vitals:   04/13/19 0530  TempSrc: Oral  PainSc: 0-No pain   Pain Goal: Patients Stated Pain Goal: 4 (04/12/19 1305)                 Cephus Shelling

## 2019-04-18 ENCOUNTER — Other Ambulatory Visit (HOSPITAL_COMMUNITY): Payer: No Typology Code available for payment source

## 2019-04-19 ENCOUNTER — Inpatient Hospital Stay (HOSPITAL_COMMUNITY): Payer: No Typology Code available for payment source

## 2019-04-19 MED FILL — SERTRALINE HCL 50 MG TABLET: 50 | 30 days supply | Qty: 30 | Fill #0

## 2019-05-09 MED FILL — LABETALOL HCL 200 MG TABS: 200 | 30 days supply | Qty: 60 | Fill #1

## 2019-05-09 MED FILL — BUPROPION HCL SR 200 MG TAB: 200 | 30 days supply | Qty: 60 | Fill #1

## 2019-06-03 ENCOUNTER — Encounter: Payer: No Typology Code available for payment source | Admitting: Family Medicine

## 2019-06-07 ENCOUNTER — Ambulatory Visit: Payer: No Typology Code available for payment source | Admitting: Family Medicine

## 2019-06-17 MED FILL — BUPROPION HCL SR 200 MG TAB: 200 | 30 days supply | Qty: 60 | Fill #2

## 2019-06-17 MED FILL — LABETALOL HCL 200 MG TABS: 200 | 30 days supply | Qty: 60 | Fill #2

## 2019-07-27 MED FILL — LABETALOL HCL 200 MG TABS: 200 | 30 days supply | Qty: 60 | Fill #3

## 2019-07-27 MED FILL — SERTRALINE HCL 50 MG TABLET: 50 | 30 days supply | Qty: 30 | Fill #3

## 2019-07-27 MED FILL — BUPROPION HCL SR 200 MG TAB: 200 | 30 days supply | Qty: 60 | Fill #3

## 2019-09-06 MED FILL — SERTRALINE HCL 50 MG TABLET: 50 | 30 days supply | Qty: 30 | Fill #4

## 2020-05-09 IMAGING — US US RENAL
1 series · 15 of 25 positions shown · non-contrast
Comparison: Renal ultrasound 12/01/2018

CLINICAL DATA: Hematuria, left back pain, 35 weeks pregnant

EXAM:
RENAL / URINARY TRACT ULTRASOUND COMPLETE

[Series 1: us renal · 15 of 33 slices shown]
[im 1/33]
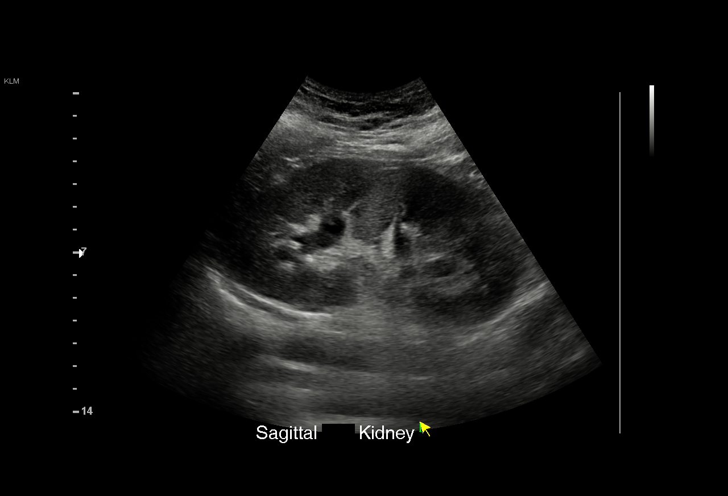
[im 3/33]
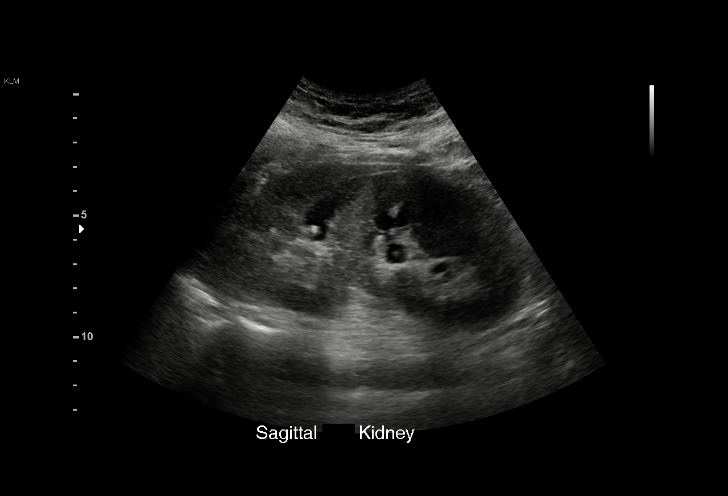
[im 6/33]
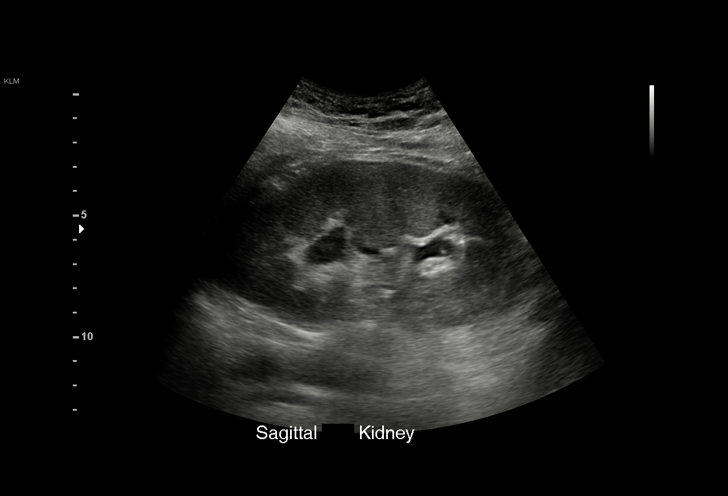
[im 7/33]
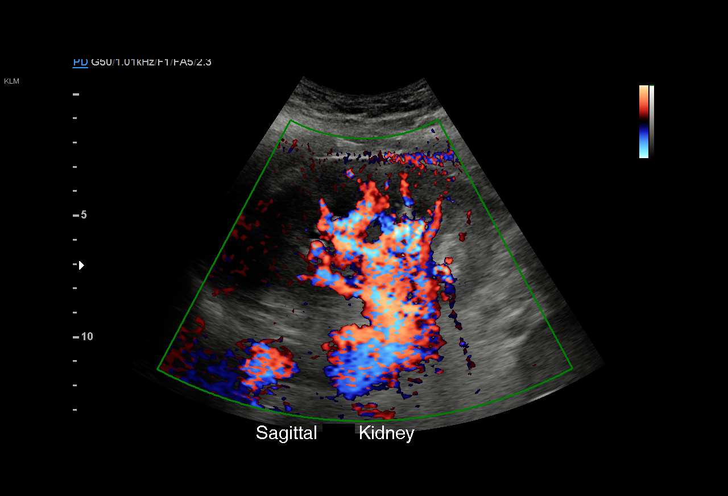
[im 10/33]
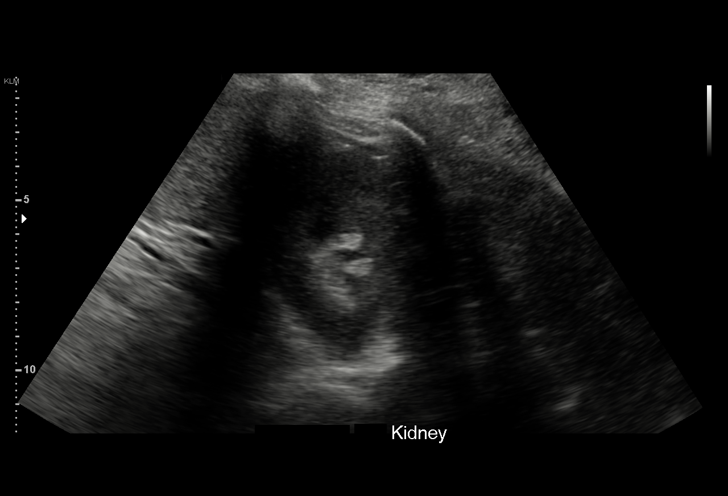
[im 13/33]
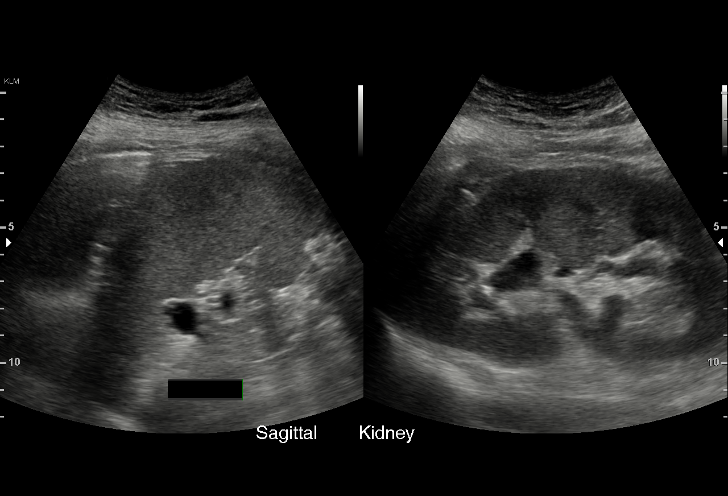
[im 14/33]
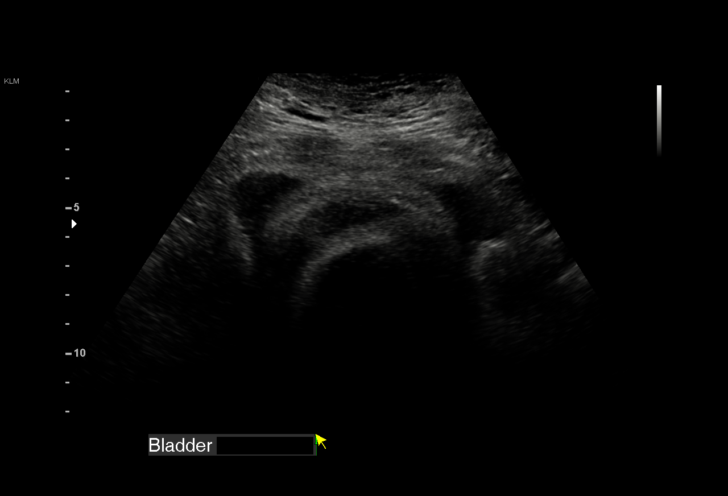
[im 17/33]
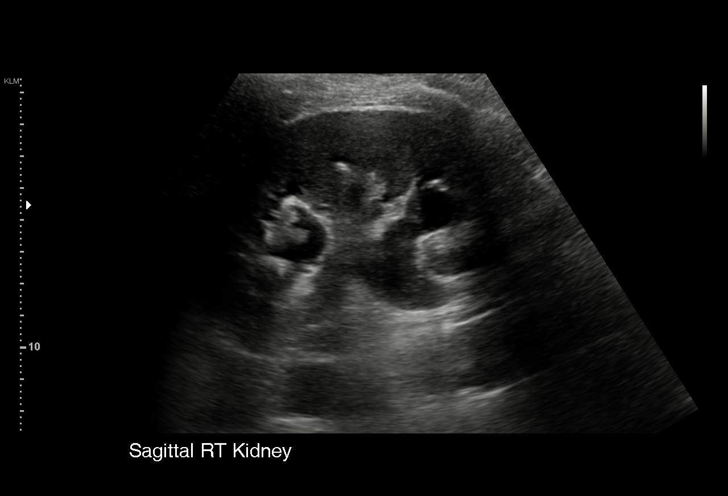
[im 19/33]
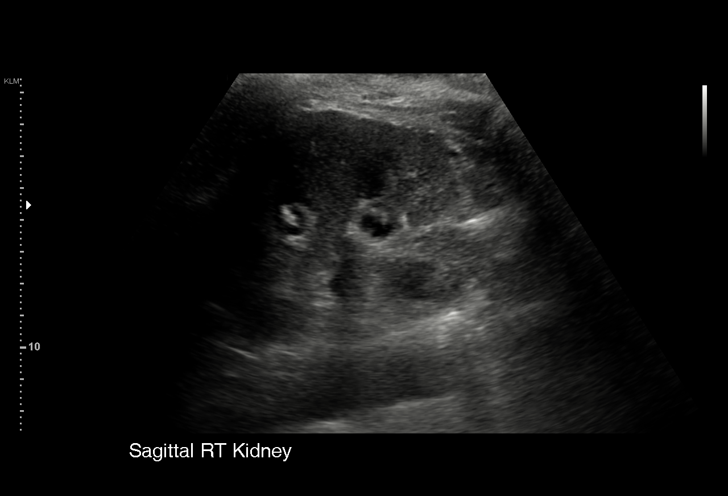
[im 21/33]
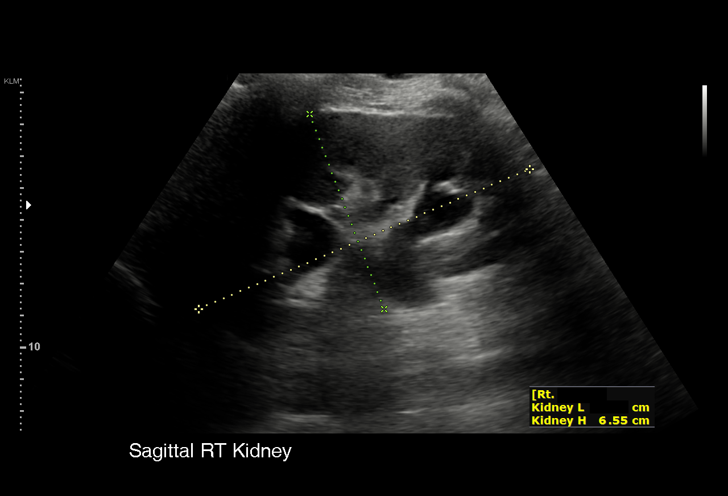
[im 23/33]
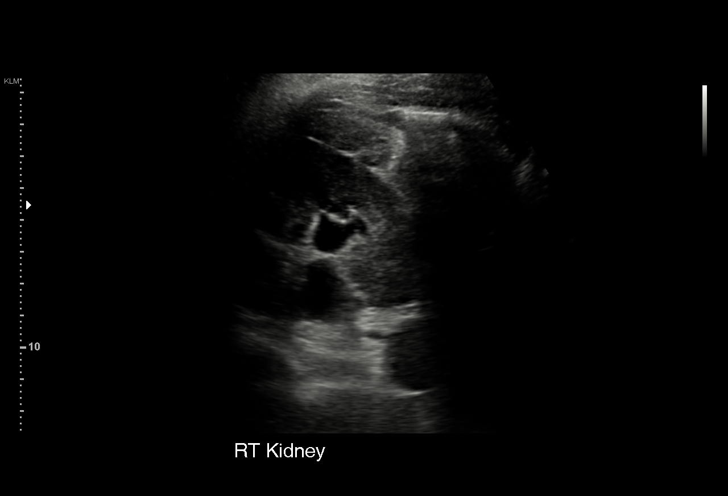
[im 26/33]
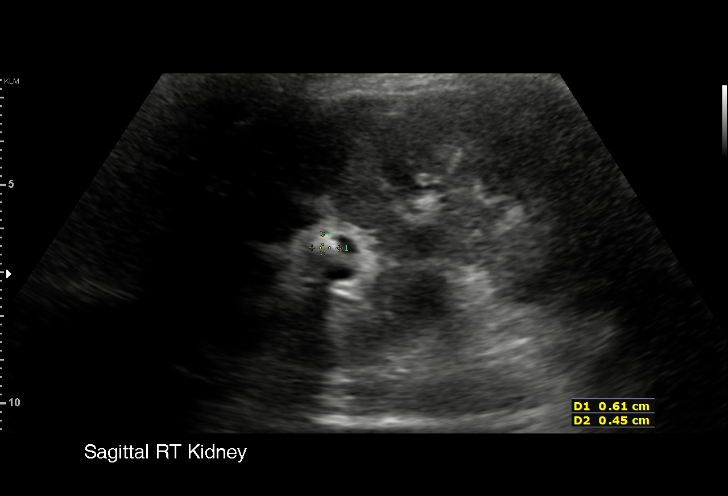
[im 27/33]
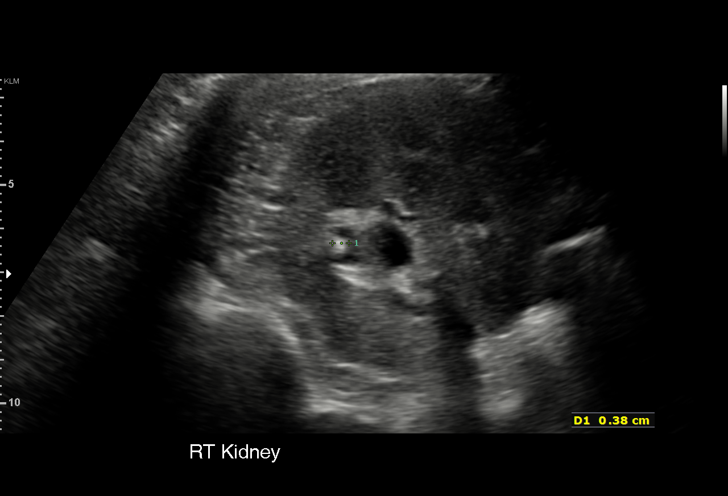
[im 30/33]
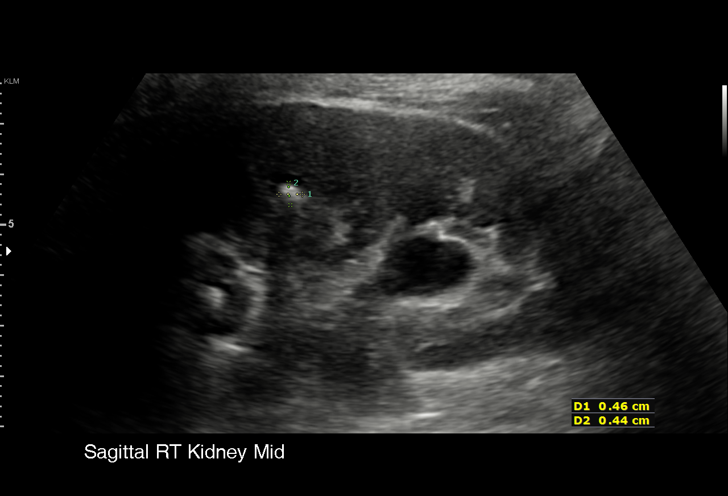
[im 33/33]
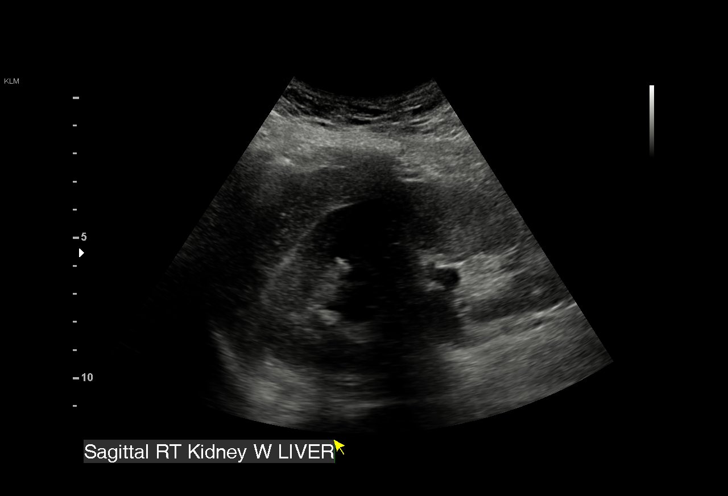

[15 of 25 positions shown; findings below may reference images not displayed]

FINDINGS: Right Kidney:

Renal measurements: 11.3 x 6.6 x 5.7 cm = volume: 222 mL. Moderate
right hydronephrosis. There are 2 small echogenic, shadowing calculi
are present in the upper pole, largest measuring up to 6 mm in size.
No worrisome renal lesion.

Left Kidney:

Renal measurements: 13.2 x 6.1 x 5.1 = volume: 215 mL. Moderate left
hydronephrosis. No echogenic calculi or worrisome renal lesion.

Bladder:

Appears normal for degree of bladder distention.

Other:

None.
IMPRESSION: Nonobstructing calculi are present in the upper pole right kidney.

There is moderate fairly symmetric bilateral hydronephrosis. May
reflect hydronephrosis of pregnancy though a distal obstructing
urolith is not entirely excluded particularly given hematuria.

## 2021-01-13 NOTE — L&D Delivery Note (Signed)
Delivery Note At 2:52 PM a viable and healthy female was delivered via Vaginal, Spontaneous (Presentation: LOA ).  APGAR: 9, 9; weight pending .   Placenta status: spontaneous , intact .  Cord: 3 vessels with the following complications: [place] x one reduced on perineiumNone.  Cord pH: na  Anesthesia: Epidural Episiotomy:na   Lacerations:  na Suture Repair:  na Est. Blood Loss (mL):  100  Mom to postpartum.  Baby to Couplet care / Skin to Skin.  Doyne Ellinger J 09/18/2021, 3:04 PM

## 2021-03-14 LAB — OB RESULTS CONSOLE RPR: RPR: NONREACTIVE

## 2021-03-14 LAB — OB RESULTS CONSOLE RUBELLA ANTIBODY, IGM: Rubella: IMMUNE

## 2021-03-14 LAB — OB RESULTS CONSOLE HEPATITIS B SURFACE ANTIGEN: Hepatitis B Surface Ag: NEGATIVE

## 2021-03-14 LAB — OB RESULTS CONSOLE HIV ANTIBODY (ROUTINE TESTING): HIV: NONREACTIVE

## 2021-04-02 LAB — OB RESULTS CONSOLE GC/CHLAMYDIA
Chlamydia: NEGATIVE
Neisseria Gonorrhea: NEGATIVE

## 2021-04-23 ENCOUNTER — Telehealth: Payer: Self-pay

## 2021-04-23 ENCOUNTER — Other Ambulatory Visit: Payer: Self-pay | Admitting: Obstetrics and Gynecology

## 2021-04-23 DIAGNOSIS — Q8740 Marfan's syndrome, unspecified: Secondary | ICD-10-CM

## 2021-05-15 ENCOUNTER — Other Ambulatory Visit (HOSPITAL_COMMUNITY): Payer: Self-pay | Admitting: Obstetrics and Gynecology

## 2021-05-15 ENCOUNTER — Ambulatory Visit (HOSPITAL_COMMUNITY)
Admission: RE | Admit: 2021-05-15 | Discharge: 2021-05-15 | Disposition: A | Discharge status: Home / Self Care | Payer: No Typology Code available for payment source | Source: Ambulatory Visit | Attending: Obstetrics and Gynecology | Admitting: Obstetrics and Gynecology

## 2021-05-15 DIAGNOSIS — R52 Pain, unspecified: Secondary | ICD-10-CM | POA: Diagnosis not present

## 2021-05-15 NOTE — Progress Notes (Signed)
Lower extremity venous has been completed.  ? ?Preliminary results in CV Proc.  ? ?Carol Pugh ?05/15/2021 3:20 PM    ?

## 2021-05-24 ENCOUNTER — Encounter: Payer: Self-pay | Admitting: *Deleted

## 2021-05-29 ENCOUNTER — Ambulatory Visit: Payer: No Typology Code available for payment source

## 2021-05-29 ENCOUNTER — Other Ambulatory Visit: Payer: Self-pay | Admitting: *Deleted

## 2021-05-29 ENCOUNTER — Encounter: Payer: Self-pay | Admitting: *Deleted

## 2021-05-29 ENCOUNTER — Ambulatory Visit: Payer: No Typology Code available for payment source | Admitting: *Deleted

## 2021-05-29 ENCOUNTER — Ambulatory Visit (HOSPITAL_BASED_OUTPATIENT_CLINIC_OR_DEPARTMENT_OTHER): Payer: No Typology Code available for payment source | Admitting: Obstetrics and Gynecology

## 2021-05-29 ENCOUNTER — Ambulatory Visit (HOSPITAL_BASED_OUTPATIENT_CLINIC_OR_DEPARTMENT_OTHER): Payer: No Typology Code available for payment source

## 2021-05-29 ENCOUNTER — Ambulatory Visit: Payer: No Typology Code available for payment source | Attending: Obstetrics and Gynecology

## 2021-05-29 ENCOUNTER — Ambulatory Visit: Payer: Self-pay | Admitting: Genetics

## 2021-05-29 VITALS — BP 128/75 | HR 88

## 2021-05-29 DIAGNOSIS — Z8279 Family history of other congenital malformations, deformations and chromosomal abnormalities: Secondary | ICD-10-CM | POA: Insufficient documentation

## 2021-05-29 DIAGNOSIS — O99212 Obesity complicating pregnancy, second trimester: Secondary | ICD-10-CM | POA: Insufficient documentation

## 2021-05-29 DIAGNOSIS — O10912 Unspecified pre-existing hypertension complicating pregnancy, second trimester: Secondary | ICD-10-CM

## 2021-05-29 DIAGNOSIS — O99891 Other specified diseases and conditions complicating pregnancy: Secondary | ICD-10-CM | POA: Diagnosis not present

## 2021-05-29 DIAGNOSIS — O321XX0 Maternal care for breech presentation, not applicable or unspecified: Secondary | ICD-10-CM | POA: Insufficient documentation

## 2021-05-29 DIAGNOSIS — Z3A21 21 weeks gestation of pregnancy: Secondary | ICD-10-CM | POA: Diagnosis not present

## 2021-05-29 DIAGNOSIS — Z363 Encounter for antenatal screening for malformations: Secondary | ICD-10-CM | POA: Insufficient documentation

## 2021-05-29 DIAGNOSIS — Q8740 Marfan's syndrome, unspecified: Secondary | ICD-10-CM

## 2021-05-29 DIAGNOSIS — E669 Obesity, unspecified: Secondary | ICD-10-CM

## 2021-05-29 DIAGNOSIS — O26892 Other specified pregnancy related conditions, second trimester: Secondary | ICD-10-CM | POA: Insufficient documentation

## 2021-05-29 DIAGNOSIS — O285 Abnormal chromosomal and genetic finding on antenatal screening of mother: Secondary | ICD-10-CM

## 2021-05-29 DIAGNOSIS — O10012 Pre-existing essential hypertension complicating pregnancy, second trimester: Secondary | ICD-10-CM

## 2021-05-29 DIAGNOSIS — Z6839 Body mass index (BMI) 39.0-39.9, adult: Secondary | ICD-10-CM | POA: Insufficient documentation

## 2021-05-29 NOTE — Progress Notes (Signed)
Maternal-Fetal Medicine  ? ?Name: Ludell Manross ?DOB: 18-Jan-1995 ?MRN: [id number] ?Referring Provider: Brien Few, MD ? ?I had the pleasure of seeing Ms. Bonanno today at the [facility]. She is G2 P1001 at 76-weeks' gestation and is here for fetal anatomy scan and consultation. Patient had genetic counseling today and you will be receiving a separate letter from her. ?Her problems include: ?Marfan syndrome.  Patient had a preconception consultation with me in January 2020 for family history of Marfan syndrome.  Her father had Marfan syndrome and unfortunately died at age 68 following rupture of aortic aneurysm.  After delivery of her first child, patient was screened and was found to have the mutation consistent with Marfan syndrome.  She is being followed by Dr. Marko Plume at [facility]. ?On echocardiography performed in March 2023, the aortic root measurement was 38 mm (reported as normal).  She will be having a follow-up echocardiography next month and another one in the third trimester ?Rest of the cardiac anatomy appears normal.  She does not have pulmonary or ocular involvements. ? ?Chronic hypertension. Patient has chronic hypertension since age 63 and takes labetalol 200 mg twice daily.  Her blood pressures are well controlled. ?She does not have diabetes or any other chronic medical conditions.  She reports having chronic migraine and takes Imitrex as needed. ? ?Past surgical history: Lithotripsies and stent (for renal calculi), arthroscopic right knee surgery. ?Medications: Prenatal vitamins, low-dose aspirin, labetalol. ?Allergies: No known drug allergies. ?Social history: Denies tobacco or drug or alcohol use.  She has been married for years and her husband is in good health. ?Family history: Father had Marfan syndrome.  No history of venous thromboembolism in the family.  She has a brother who does not have Marfan syndrome.  Patient has not screened her son for Marfan  syndrome. ? ?Obstetric history is significant for a term vaginal delivery in 2021 of a female infant weighing 6 pounds and 7 ounces at birth.  Her pregnancy and delivery were uncomplicated. ?GYN history: No history of cervical surgeries. ? ?Ultrasound ?We performed a fetal anatomical survey.  No markers of aneuploidies or fetal structural defects are seen.  Fetal biometry is consistent with the previously established dates.  On transabdominal scan, the cervix looks long and closed. ?Patient understands the limitations of ultrasound in detecting fetal anomalies. ? ?Our concerns include: ?Marfan Syndrome: ? ?Marfan syndrome is inherited in an autosomal dominant fashion and carriers a 50% transmission. Most patients have FBN-1 mutations (chromosome 15). Of particular concern in women who are pregnant or attempting pregnancy, is the presence or absence of aortic disease. If aortic aneurysm is present, pregnancy increases the likelihood of dissection or rupture. This complication increases in patients who have aortic root dilation greater than 40 millimeters although it can occur in women without aortic root dilation.  ?Our patient is beingn followed at [facility] and she had echocardiography that showed aortic root measurement of 38 millimeters (less than 40 millimeters).  ?I reassured her that in the absence of significant aortic root dilation, the risk is very small. ?Patient will have echocardiography in the second and third trimesters. ? ?If aortic root dimensions remain at less than 40 mm, she can attempt vaginal delivery. If it increases to 45 mm or above, cesarean section would be advised. If aortic root diameter is between 40 and 45 mm, we will counsel her and arrive at a shared decision. ? ?The risk of aortic rupture is very low if it remains  less than 40 mm (1%). ? ?Chronic hypertension ?-Adverse outcomes of severe chronic hypertension include maternal stroke, endorgan damage, coagulation disturbances.  Placental abruption is more common. ?-Superimposed preeclampsia occurs in more than 30% of women with chronic hypertension ?I discussed the benefit of low-dose aspirin prophylaxis that helps delaying or preventing preeclampsia. ?-I discussed the safety profile of antihypertensives.  Labetalol can be safely given in pregnancy.  It can be associated with low birthweights.  Alternative medications include nifedipine. ?-I discussed ultrasound protocol of monitoring fetal growth assessment and antenatal testing. ?-Timing of delivery: Provided her blood pressures are well controlled, she can be delivered at 39 weeks' gestation.  Early term delivery is an option if hypertension is not well controlled. ? ? ?Recommendations ?-An appointment was made for her to return in 4 weeks for completion of fetal anatomy. ?-Fetal growth assessments every 4 weeks. ?-Weekly BPP from 32 weeks' gestation till delivery. ?-Echocardiography to monitor aortic root diameter and decide on mode of delivery. ? ?Thank you for consultation.  If you have any questions or concerns, please contact me the [facility].  Consultation including face-to-face (more than 50%) counseling 30 minutes. ? ?

## 2021-05-29 NOTE — Progress Notes (Signed)
Name: JAYDIN JALOMO Indication: Personal history of Marfan Syndrome  DOB: 03/02/1995 Age: 26 y.o.   EDC: 10/09/2021 LMP: 12/25/2020 Referring Provider:  Olivia Mackie, MD  EGA: [redacted]w[redacted]d Genetic Counselor: Teena Dunk, MS, CGC  OB Hx: [id number] Date of Appointment: 05/29/2021  Accompanied by: Her paternal great aunt Face to Face Time: 40 Minutes   Previous Testing Completed: CBC from 03/14/2021 reviewed. MCV within normal limits. It is unlikely that Maleiah is a beta thalassemia carrier or an alpha thalassemia carrier of the double-gene deletion. Individuals with a normal MCV may be single-gene deletion carriers, but it is unlikely that the current pregnancy would be affected with alpha or beta thalassemia major. Hemoglobin fractionation cascade from 03/14/2021 reviewed. No abnormal hemoglobin bands were noted.  Jelisha previously completed Non-Invasive Prenatal Screening (NIPS) in this pregnancy. The result is low risk. This screening significantly reduces the risk that the current pregnancy has Down syndrome, Trisomy 16, Trisomy 61, and common sex chromosome conditions, however, the risk is not zero given the limitations of NIPS. Additionally, there are many genetic conditions that cannot be detected by NIPS.    Medical History:  This is Ruthanne's 2nd pregnancy. She has a living, healthy 27 year old son. Reports she takes labetalol, Wellbutrin, Zoloft, potassium citrate, Zofran, aspirin, Imitrex, and prenatal vitamins. 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 Denies personal history of diabetes, thyroid conditions, and seizures. Denies bleeding, infections, and fevers in this pregnancy. Denies using tobacco, alcohol, or street drugs in this pregnancy.   Family History: A pedigree was created and scanned into Epic under the Media tab. Rodney reported to genetic counseling that her  father was also affected with Marfan syndrome. She reports he died at age 39 during a surgical repair of an aortic aneurysm. Emely reported to genetic counseling her full brother was tested for Marfan syndrome and was found to be negative for the pathogenic variant she carries. Lavella reports her paternal grandfather has an aortic aneurysm.  Maternal ethnicity reported as Caucasian and paternal ethnicity reported as Caucasian. Denies Ashkenazi Jewish ancestry. Family history not remarkable for consanguinity, intellectual disability, autism spectrum disorder, multiple spontaneous abortions, still births, or unexplained neonatal death.     Genetic Counseling:   Personal History of Marfan Syndrome. Merary has a personal history of Marfan syndrome diagnosed through [facility]. Marfan syndrome is an autosomal dominant condition caused by mutations in the FBN1 gene. Approximately 75% of individuals with Marfan syndrome have an affected parent; approximately 25% have a de novo (new) FBN1 pathogenic variant. Marfan syndrome has a high degree of clinical variability, and primary manifestations involve the ocular, skeletal, and cardiovascular systems. Ocular findings include myopia, ectopia lentis, increased risk for retinal detachment, glaucoma, and early cataracts. Skeletal features include bone overgrowth and joint laxity, disproportionately long extremities for size of trunk, pectus anomalies, and scoliosis. The major morbidity and early mortality in Marfan syndrome relate to the cardiovascular system and include dilatation of the aorta, mitral valve prolapse, tricuspid valve prolapse, and enlargement of the proximal pulmonary artery. With proper management, the life expectancy of a person affected with Marfan syndrome approximates that of the general population. Due to the autosomal dominant inheritance pattern of this condition, all offspring of an affected individual have  a 50% risk to also be affected. Given this 50% risk, genetic counseling offered Fumiye the option of amniocentesis for prenatal diagnosis. Possible procedural difficulties  and complications that can arise include maternal infection, cramping, bleeding, fluid leakage, and/or pregnancy loss. The risk for pregnancy loss with an amniocentesis is 1/500. Per the [employer]necologists (ACOG) Practice Bulletin 162, all pregnant women should be offered prenatal assessment for aneuploidy by diagnostic testing regardless of maternal age or other risk factors. If indicated, genetic testing that could be ordered on an amniocentesis sample includes a fetal karyotype, fetal microarray, and testing for specific syndromes. Crissa declined amniocentesis for prenatal diagnosis at this time. We discussed that her baby should be referred for a consultation with pediatric genetics after birth for a clinical evaluation and to discuss genetic testing for Marfan syndrome. Margueritte reported she is aware of this recommendation. Finally, we discussed that Ameirah should have a Maternal Fetal Medicine consultation given her diagnosis of Marfan Syndrome. Jasmine December completed this consultation with Dr. Judeth Cornfield today. Please see separate report for details.      Patient Plan:  Proceed with: Routine prenatal care Informed consent was obtained. All questions were answered.  Declined: Amniocentesis for prenatal diagnosis   Thank you for sharing in the care of Talaysha with [name].  Please do not hesitate to contact us if you have any questions.  Teena Dunk, MS, [facility]

## 2021-05-31 DIAGNOSIS — Q8740 Marfan's syndrome, unspecified: Secondary | ICD-10-CM | POA: Insufficient documentation

## 2021-06-26 ENCOUNTER — Ambulatory Visit: Payer: No Typology Code available for payment source

## 2021-07-17 ENCOUNTER — Other Ambulatory Visit: Payer: Self-pay

## 2021-07-17 ENCOUNTER — Encounter (HOSPITAL_COMMUNITY): Payer: Self-pay | Admitting: Obstetrics and Gynecology

## 2021-07-17 ENCOUNTER — Inpatient Hospital Stay (HOSPITAL_COMMUNITY): Payer: No Typology Code available for payment source

## 2021-07-17 ENCOUNTER — Inpatient Hospital Stay (HOSPITAL_COMMUNITY)
Admission: AD | Admit: 2021-07-17 | Discharge: 2021-07-17 | Disposition: A | Discharge status: Home / Self Care | Payer: No Typology Code available for payment source | Attending: Obstetrics and Gynecology | Admitting: Obstetrics and Gynecology

## 2021-07-17 DIAGNOSIS — Z3689 Encounter for other specified antenatal screening: Secondary | ICD-10-CM

## 2021-07-17 DIAGNOSIS — N200 Calculus of kidney: Secondary | ICD-10-CM | POA: Insufficient documentation

## 2021-07-17 DIAGNOSIS — Z79899 Other long term (current) drug therapy: Secondary | ICD-10-CM | POA: Diagnosis not present

## 2021-07-17 DIAGNOSIS — R319 Hematuria, unspecified: Secondary | ICD-10-CM

## 2021-07-17 DIAGNOSIS — O26833 Pregnancy related renal disease, third trimester: Secondary | ICD-10-CM | POA: Diagnosis present

## 2021-07-17 DIAGNOSIS — Z3A28 28 weeks gestation of pregnancy: Secondary | ICD-10-CM | POA: Insufficient documentation

## 2021-07-17 HISTORY — DX: Marfan syndrome, unspecified: Q87.40

## 2021-07-17 LAB — URINALYSIS, ROUTINE W REFLEX MICROSCOPIC
Bilirubin Urine: NEGATIVE
Glucose, UA: NEGATIVE mg/dL
Ketones, ur: 5 mg/dL — AB
Nitrite: NEGATIVE
Protein, ur: 100 mg/dL — AB
RBC / HPF: >50 RBC/hpf — ABNORMAL HIGH (ref 0–5)
Specific Gravity, Urine: 1.019 (ref 1.005–1.030)
pH: 8 (ref 5.0–8.0)

## 2021-07-17 LAB — COMPREHENSIVE METABOLIC PANEL
ALT: 21 U/L (ref 0–44)
AST: 18 U/L (ref 15–41)
Albumin: 2.7 g/dL — ABNORMAL LOW (ref 3.5–5.0)
Alkaline Phosphatase: 66 U/L (ref 38–126)
Anion gap: 7 (ref 5–15)
BUN: 6 mg/dL (ref 6–20)
CO2: 22 mmol/L (ref 22–32)
Calcium: 8.7 mg/dL — ABNORMAL LOW (ref 8.9–10.3)
Chloride: 108 mmol/L (ref 98–111)
Creatinine, Ser: 0.74 mg/dL (ref 0.44–1.00)
GFR, Estimated: >60 mL/min (ref >60)
Glucose, Bld: 80 mg/dL (ref 70–99)
Potassium: 3.8 mmol/L (ref 3.5–5.1)
Sodium: 137 mmol/L (ref 135–145)
Total Bilirubin: 0.6 mg/dL (ref 0.3–1.2)
Total Protein: 5.7 g/dL — ABNORMAL LOW (ref 6.5–8.1)

## 2021-07-17 LAB — CBC
HCT: 37.8 % (ref 36.0–46.0)
Hemoglobin: 12.2 g/dL (ref 12.0–15.0)
MCH: 30.3 pg (ref 26.0–34.0)
MCHC: 32.3 g/dL (ref 30.0–36.0)
MCV: 93.8 fL (ref 80.0–100.0)
Platelets: 248 10*3/uL (ref 150–400)
RBC: 4.03 MIL/uL (ref 3.87–5.11)
RDW: 13 % (ref 11.5–15.5)
WBC: 8.9 10*3/uL (ref 4.0–10.5)
nRBC: 0 % (ref 0.0–0.2)

## 2021-07-17 MED ORDER — TAMSULOSIN HCL 0.4 MG PO CAPS: 0.4000 mg | ORAL_CAPSULE | Freq: Every day | ORAL | 0 refills | Status: AC

## 2021-07-17 MED ORDER — ACETAMINOPHEN 500 MG PO TABS
1000.0000 mg | ORAL_TABLET | Freq: Once | ORAL | Status: AC
  Administered 2021-07-17: 1000 mg via ORAL
  Filled 2021-07-17: qty 2

## 2021-07-17 MED ORDER — SODIUM CHLORIDE 0.9 % IV SOLN
25.0000 mg | Freq: Once | INTRAVENOUS | Status: AC
  Administered 2021-07-17: 25 mg via INTRAVENOUS
  Filled 2021-07-17: qty 1

## 2021-07-17 MED ORDER — LACTATED RINGERS IV BOLUS
1000.0000 mL | Freq: Once | INTRAVENOUS | Status: AC
  Administered 2021-07-17: 1000 mL via INTRAVENOUS

## 2021-07-17 NOTE — MAU Provider Note (Signed)
History     CSN: [id number]  Arrival date and time: 07/17/21 1532   Event Date/Time   First Provider Initiated Contact with Patient 07/17/21 1906      Chief Complaint  Patient presents with   Nephrolithiasis   Back Pain   Abdominal Pain   Emesis   Hematuria   HPI Carol Pugh is a 26 y.o. G2P1001 at [redacted]w[redacted]d who presents to MAU with chief complaint of vomiting 2/2 known bilateral kidney stones. Patient states she typically has vomiting as she's passing a stone, but she is currently unable to tolerate anything PO. Her provider ordered Phenergan for her but she has not been able to pick it up from the pharmacy.  Patient also reports recurrent frank hematuria 2/2 her kidney stones. She states she is remote from Nephrology evaluation. She is predominantly managed by her OB. She states her last conversation with a Nephrologist was dissatisfying because the MD attributed her kidney stones to her BMI and the conversation was entirely focused on weight reduction. She states she has another Nephrologist in mind but has not been able to schedule care.  Patient reports recurrent abdominal and back pain. Pain score is 2/10 for both sites. She denies aggravating or alleviating factors. She has not taken medication for this complaint.  Patient denies contractions, vaginal bleeding, leaking of fluid, decreased fetal movement, fever, falls, or recent illness.   Patient receives care with [facility].   OB History     Gravida  2   Para  1   Term  1   Preterm  0   AB  0   Living  1      SAB  0   IAB  0   Ectopic  0   Multiple  0   Live Births  1           Past Medical History:  Diagnosis Date   Anxiety    Depression    Distal renal tubular acidosis    Family history of Marfan syndrome    Headache    Heart murmur    History of kidney stones    Hypertension    Kidney stones    Marfan syndrome    Migraine    PCOS (polycystic ovarian syndrome)     Past Surgical  History:  Procedure Laterality Date   EXTRACORPOREAL SHOCK WAVE LITHOTRIPSY Left 06/11/2017   Procedure: LEFT EXTRACORPOREAL SHOCK WAVE LITHOTRIPSY (ESWL);  Surgeon: Heloise Purpura, MD;  Location: [facility];  Service: Urology;  Laterality: Left;   KNEE ARTHROSCOPY WITH ANTERIOR CRUCIATE LIGAMENT (ACL) REPAIR WITH HAMSTRING GRAFT Right 10/19/2017   Procedure: RIGHT KNEE ARTHROSCOPY WITH ANTERIOR CRUCIATE LIGAMENT (ACL) REPAIR WITH AUTOGRAFT HAMSTRING;  Surgeon: Bjorn Pippin, MD;  Location: [facility];  Service: Orthopedics;  Laterality: Right;   MEDIAL PATELLOFEMORAL LIGAMENT REPAIR Right 10/19/2017   Procedure: MEDIAL PATELLA FEMORAL LIGAMENT RECONSTRUCTION RIGHT KNEE;  Surgeon: Bjorn Pippin, MD;  Location: [facility];  Service: Orthopedics;  Laterality: Right;   urinary stent placements      Family History  Problem Relation Age of Onset   Marfan syndrome Father    Ovarian cancer Paternal Grandmother     Social History   Tobacco Use   Smoking status: Former    Packs/day: 0.25    Years: 5.00    Total pack years: 1.25    Types: Cigarettes   Smokeless tobacco: Never  Vaping Use   Vaping Use: Former  Substances: Nicotine  Substance Use Topics   Alcohol use: Not Currently   Drug use: Never    Allergies:  Allergies  Allergen Reactions   Bee Venom Hives and Swelling    Medications Prior to Admission  Medication Sig Dispense Refill Last Dose   aspirin EC 81 MG tablet Take 81 mg by mouth daily. Swallow whole.   07/17/2021   buPROPion (WELLBUTRIN SR) 200 MG 12 hr tablet TAKE 1 TABLET BY MOUTH TWICE DAILY 60 tablet 3 07/17/2021   labetalol (NORMODYNE) 200 MG tablet TAKE 1 TABLET BY MOUTH TWICE DAILY 60 tablet 3 07/17/2021   ondansetron (ZOFRAN) 4 MG tablet Take 4 mg by mouth every 8 (eight) hours as needed for nausea or vomiting.   07/17/2021   Prenatal Vit-Fe Fumarate-FA (MULTIVITAMIN-PRENATAL) 27-0.8 MG TABS tablet Take 1 tablet by mouth daily at 12 noon.    07/17/2021   Butalbital-APAP-Caffeine 50-300-40 MG CAPS Take 1 capsule by mouth as needed. (Patient not taking: Reported on 05/29/2021)      EPINEPHrine 0.3 mg/0.3 mL IJ SOAJ injection Inject into the muscle.      oxyCODONE-acetaminophen (PERCOCET/ROXICET) 5-325 MG tablet Take 1 tablet by mouth as needed for severe pain (kidney stones).      potassium citrate (UROCIT-K) 10 MEQ (1080 MG) SR tablet Take 10 mEq by mouth 3 (three) times daily with meals.      sertraline (ZOLOFT) 50 MG tablet Take 50 mg by mouth daily.      SUMAtriptan (IMITREX) 100 MG tablet Take 100 mg by mouth every 2 (two) hours as needed for migraine. May repeat in 2 hours if headache persists or recurs.      tamsulosin (FLOMAX) 0.4 MG CAPS capsule Take 1 capsule (0.4 mg total) by mouth daily. (Patient not taking: Reported on 05/29/2021) 5 capsule 0     Review of Systems  Gastrointestinal:  Positive for abdominal pain.  Genitourinary:  Positive for hematuria.  Musculoskeletal:  Positive for back pain.  All other systems reviewed and are negative.  Physical Exam   Blood pressure (!) 110/58, pulse 98, temperature 98 F (36.7 C), temperature source Oral, resp. rate 16, weight 114.8 kg, last menstrual period 12/25/2020, SpO2 99 %, unknown if currently breastfeeding.  Physical Exam Vitals and nursing note reviewed. Exam conducted with a chaperone present.  Constitutional:      Appearance: She is well-developed. She is not ill-appearing.  Cardiovascular:     Rate and Rhythm: Normal rate and regular rhythm.     Heart sounds: Normal heart sounds.  Pulmonary:     Effort: Pulmonary effort is normal.     Breath sounds: Normal breath sounds.  Abdominal:     Palpations: Abdomen is soft.     Tenderness: There is no abdominal tenderness. There is no right CVA tenderness or left CVA tenderness.     Comments: Mild TTP right lower back, no accompanying CVAT  Skin:    General: Skin is warm and dry.     Capillary Refill: Capillary  refill takes less than 2 seconds.  Neurological:     Mental Status: She is alert and oriented to person, place, and time.     MAU Course  Procedures  MDM --Reactive tracing: baseline 135, mod var, + accels, no decels --Toco: occasional UI --Patient sleeping in left lateral s/p PO Tylenol --Pertinent negatives: currently negative for Leukocytosis, CVAT, fever, dysuria  Orders Placed This Encounter  Procedures   Culture, OB Urine   US RENAL   Urinalysis,  Routine w reflex microscopic Urine, Clean Catch   CBC   Comprehensive metabolic panel   Insert peripheral IV   Discharge patient   Patient Vitals for the past 24 hrs:  BP Temp Temp src Pulse Resp SpO2 Weight  07/17/21 1928 128/73 -- -- 87 -- -- --  07/17/21 1925 128/73 -- -- -- -- -- --  07/17/21 1604 (!) 110/58 98 F (36.7 C) Oral 98 16 99 % 114.8 kg   Results for orders placed or performed during the hospital encounter of 07/17/21 (from the past 24 hour(s))  Urinalysis, Routine w reflex microscopic Urine, Clean Catch     Status: Abnormal   Collection Time: 07/17/21  4:08 PM  Result Value Ref Range   Color, Urine AMBER (A) YELLOW   APPearance CLOUDY (A) CLEAR   Specific Gravity, Urine 1.019 1.005 - 1.030   pH 8.0 5.0 - 8.0   Glucose, UA NEGATIVE NEGATIVE mg/dL   Hgb urine dipstick MODERATE (A) NEGATIVE   Bilirubin Urine NEGATIVE NEGATIVE   Ketones, ur 5 (A) NEGATIVE mg/dL   Protein, ur 166 (A) NEGATIVE mg/dL   Nitrite NEGATIVE NEGATIVE   Leukocytes,Ua TRACE (A) NEGATIVE   RBC / HPF >50 (H) 0 - 5 RBC/hpf   WBC, UA 0-5 0 - 5 WBC/hpf   Bacteria, UA MANY (A) NONE SEEN   Squamous Epithelial / LPF 21-50 0 - 5   Mucus PRESENT   CBC     Status: None   Collection Time: 07/17/21  5:52 PM  Result Value Ref Range   WBC 8.9 4.0 - 10.5 K/uL   RBC 4.03 3.87 - 5.11 MIL/uL   Hemoglobin 12.2 12.0 - 15.0 g/dL   HCT 06.3 01.6 - 01.0 %   MCV 93.8 80.0 - 100.0 fL   MCH 30.3 26.0 - 34.0 pg   MCHC 32.3 30.0 - 36.0 g/dL   RDW 93.2  35.5 - 73.2 %   Platelets 248 150 - 400 K/uL   nRBC 0.0 0.0 - 0.2 %  Comprehensive metabolic panel     Status: Abnormal   Collection Time: 07/17/21  5:52 PM  Result Value Ref Range   Sodium 137 135 - 145 mmol/L   Potassium 3.8 3.5 - 5.1 mmol/L   Chloride 108 98 - 111 mmol/L   CO2 22 22 - 32 mmol/L   Glucose, Bld 80 70 - 99 mg/dL   BUN 6 6 - 20 mg/dL   Creatinine, Ser 2.02 0.44 - 1.00 mg/dL   Calcium 8.7 (L) 8.9 - 10.3 mg/dL   Total Protein 5.7 (L) 6.5 - 8.1 g/dL   Albumin 2.7 (L) 3.5 - 5.0 g/dL   AST 18 15 - 41 U/L   ALT 21 0 - 44 U/L   Alkaline Phosphatase 66 38 - 126 U/L   Total Bilirubin 0.6 0.3 - 1.2 mg/dL   GFR, Estimated >54 >27 mL/min   Anion gap 7 5 - 15   US RENAL  Result Date: 07/17/2021 CLINICAL DATA:  Right costovertebral angle tenderness and hematuria for 4 days. EXAM: RENAL / URINARY TRACT ULTRASOUND COMPLETE COMPARISON:  Renal ultrasound dated 03/29/2019. FINDINGS: Right Kidney: Renal measurements: 10.0 x 4.6 x 4.6 cm = volume: 109 mL. Echogenicity within normal limits. Three shadowing calculi are noted with the largest measuring 11 mm. No solid mass or hydronephrosis visualized. Left Kidney: Renal measurements: 9.9 x 5.2 x 6.1 cm = volume: 164 mL. Echogenicity within normal limits. Two shadowing calculi are noted with the largest  measuring 10 mm. No solid mass or hydronephrosis visualized. Bladder: Appears normal for degree of bladder distention. Other: None. IMPRESSION: Renal calculi seen on both sides measuring up to 11 mm on the right. No hydronephrosis. Electronically Signed   By: Zerita Boers M.D.   On: 07/17/2021 19:03    Meds ordered this encounter  Medications   lactated ringers bolus 1,000 mL   promethazine (PHENERGAN) 25 mg in sodium chloride 0.9 % 50 mL IVPB   acetaminophen (TYLENOL) tablet 1,000 mg   tamsulosin (FLOMAX) 0.4 MG CAPS capsule    Sig: Take 1 capsule (0.4 mg total) by mouth daily.    Dispense:  30 capsule    Refill:  0    Order Specific  Question:   Supervising Provider    Answer:   [place] Mode [id number]   Assessment and Plan  --26 y.o. G2P1001 at [redacted]w[redacted]d  --Reactive tracing --Recurrent hematuria 2/2 previously diagnosed bilateral kidney stones --No evidence of obstruction on today's imaging --No episodes of vomiting during MAU encounter --Encouraged Flowmax, Phenergan and lemon water for ongoing management of stones --Phenergan sent by outside provider --Patient declined referral to Nephrology --Discharge home in stable condition  Darlina Rumpf, [place], MSN, CNM

## 2021-07-17 NOTE — MAU Note (Addendum)
Patient arrived to MAU referred by [facility] for evaluation. Patient stated that she has been diagnosed with kidney stones. She reports lower back and lower abd pain that is 2/10. Bleeding when when wipes. Pt stated that the blood is not vaginal bleeding, but from the kidney stones which started on Saturday. Patient is also complaining of nausea and vomiting that has gotten worse since tuesday.she stated that she can not keep anything down and she is dehydrated and exhausted.  + FM reported.  Pt denies vaginal bleeding, Leakage of fluid, and/or contractions.

## 2021-07-18 LAB — CULTURE, OB URINE: Culture: <10000 — AB

## 2021-09-01 ENCOUNTER — Inpatient Hospital Stay (HOSPITAL_COMMUNITY): Payer: No Typology Code available for payment source

## 2021-09-01 ENCOUNTER — Encounter (HOSPITAL_COMMUNITY): Payer: Self-pay | Admitting: Obstetrics and Gynecology

## 2021-09-01 ENCOUNTER — Inpatient Hospital Stay (HOSPITAL_COMMUNITY)
Admission: AD | Admit: 2021-09-01 | Discharge: 2021-09-03 | DRG: 818 | Disposition: A | Discharge status: Home / Self Care | Payer: No Typology Code available for payment source | Attending: Obstetrics and Gynecology | Admitting: Obstetrics and Gynecology

## 2021-09-01 DIAGNOSIS — F419 Anxiety disorder, unspecified: Secondary | ICD-10-CM | POA: Diagnosis present

## 2021-09-01 DIAGNOSIS — O99343 Other mental disorders complicating pregnancy, third trimester: Secondary | ICD-10-CM | POA: Diagnosis present

## 2021-09-01 DIAGNOSIS — O26833 Pregnancy related renal disease, third trimester: Principal | ICD-10-CM | POA: Diagnosis present

## 2021-09-01 DIAGNOSIS — Q8740 Marfan's syndrome, unspecified: Secondary | ICD-10-CM

## 2021-09-01 DIAGNOSIS — R109 Unspecified abdominal pain: Secondary | ICD-10-CM | POA: Diagnosis not present

## 2021-09-01 DIAGNOSIS — O10013 Pre-existing essential hypertension complicating pregnancy, third trimester: Secondary | ICD-10-CM | POA: Diagnosis present

## 2021-09-01 DIAGNOSIS — Z3A34 34 weeks gestation of pregnancy: Secondary | ICD-10-CM

## 2021-09-01 DIAGNOSIS — Z87442 Personal history of urinary calculi: Secondary | ICD-10-CM

## 2021-09-01 DIAGNOSIS — N200 Calculus of kidney: Secondary | ICD-10-CM

## 2021-09-01 DIAGNOSIS — Z8279 Family history of other congenital malformations, deformations and chromosomal abnormalities: Secondary | ICD-10-CM

## 2021-09-01 DIAGNOSIS — N132 Hydronephrosis with renal and ureteral calculous obstruction: Secondary | ICD-10-CM | POA: Diagnosis present

## 2021-09-01 DIAGNOSIS — Z87891 Personal history of nicotine dependence: Secondary | ICD-10-CM

## 2021-09-01 DIAGNOSIS — F32A Depression, unspecified: Secondary | ICD-10-CM | POA: Diagnosis present

## 2021-09-01 LAB — CBC WITH DIFFERENTIAL/PLATELET
Abs Immature Granulocytes: 0.1 10*3/uL — ABNORMAL HIGH (ref 0.00–0.07)
Basophils Absolute: 0 10*3/uL (ref 0.0–0.1)
Basophils Relative: 0 %
Eosinophils Absolute: 0.1 10*3/uL (ref 0.0–0.5)
Eosinophils Relative: 1 %
HCT: 37.2 % (ref 36.0–46.0)
Hemoglobin: 12.1 g/dL (ref 12.0–15.0)
Immature Granulocytes: 1 %
Lymphocytes Relative: 30 %
Lymphs Abs: 2.7 10*3/uL (ref 0.7–4.0)
MCH: 29.7 pg (ref 26.0–34.0)
MCHC: 32.5 g/dL (ref 30.0–36.0)
MCV: 91.2 fL (ref 80.0–100.0)
Monocytes Absolute: 0.7 10*3/uL (ref 0.1–1.0)
Monocytes Relative: 8 %
Neutro Abs: 5.4 10*3/uL (ref 1.7–7.7)
Neutrophils Relative %: 60 %
Platelets: 241 10*3/uL (ref 150–400)
RBC: 4.08 MIL/uL (ref 3.87–5.11)
RDW: 13.4 % (ref 11.5–15.5)
WBC: 9 10*3/uL (ref 4.0–10.5)
nRBC: 0 % (ref 0.0–0.2)

## 2021-09-01 LAB — COMPREHENSIVE METABOLIC PANEL
ALT: 18 U/L (ref 0–44)
AST: 17 U/L (ref 15–41)
Albumin: 2.6 g/dL — ABNORMAL LOW (ref 3.5–5.0)
Alkaline Phosphatase: 96 U/L (ref 38–126)
Anion gap: 8 (ref 5–15)
BUN: 8 mg/dL (ref 6–20)
CO2: 22 mmol/L (ref 22–32)
Calcium: 8.9 mg/dL (ref 8.9–10.3)
Chloride: 105 mmol/L (ref 98–111)
Creatinine, Ser: 0.67 mg/dL (ref 0.44–1.00)
GFR, Estimated: >60 mL/min (ref >60)
Glucose, Bld: 83 mg/dL (ref 70–99)
Potassium: 3.9 mmol/L (ref 3.5–5.1)
Sodium: 135 mmol/L (ref 135–145)
Total Bilirubin: 0.7 mg/dL (ref 0.3–1.2)
Total Protein: 5.6 g/dL — ABNORMAL LOW (ref 6.5–8.1)

## 2021-09-01 LAB — URINALYSIS, ROUTINE W REFLEX MICROSCOPIC
Bilirubin Urine: NEGATIVE
Glucose, UA: NEGATIVE mg/dL
Ketones, ur: NEGATIVE mg/dL
Nitrite: NEGATIVE
Protein, ur: NEGATIVE mg/dL
Specific Gravity, Urine: <1.005 — ABNORMAL LOW (ref 1.005–1.030)
pH: 7 (ref 5.0–8.0)

## 2021-09-01 LAB — URINALYSIS, MICROSCOPIC (REFLEX): RBC / HPF: >50 RBC/hpf (ref 0–5)

## 2021-09-01 MED ORDER — ONDANSETRON HCL 4 MG PO TABS
4.0000 mg | ORAL_TABLET | Freq: Three times a day (TID) | ORAL | Status: DC | PRN
Start: 2021-09-01 — End: 2021-09-03

## 2021-09-01 MED ORDER — SODIUM CHLORIDE 0.9 % IV SOLN
25.0000 mg | Freq: Once | INTRAVENOUS | Status: AC
  Administered 2021-09-01: 25 mg via INTRAVENOUS
  Filled 2021-09-01: qty 1

## 2021-09-01 MED ORDER — ASPIRIN 81 MG PO TBEC
81.0000 mg | DELAYED_RELEASE_TABLET | Freq: Every day | ORAL | Status: DC
  Administered 2021-09-02 – 2021-09-03 (×2): 81 mg via ORAL
  Filled 2021-09-01 (×2): qty 1

## 2021-09-01 MED ORDER — LACTATED RINGERS IV BOLUS
1000.0000 mL | Freq: Once | INTRAVENOUS | Status: AC
  Administered 2021-09-01: 1000 mL via INTRAVENOUS

## 2021-09-01 MED ORDER — HYDROMORPHONE HCL 1 MG/ML IJ SOLN
1.0000 mg | INTRAMUSCULAR | Status: DC | PRN
  Administered 2021-09-02 (×2): 1 mg via INTRAVENOUS
  Filled 2021-09-01 (×2): qty 1

## 2021-09-01 MED ORDER — ACETAMINOPHEN 325 MG PO TABS
650.0000 mg | ORAL_TABLET | ORAL | Status: DC | PRN
  Administered 2021-09-02: 650 mg via ORAL
  Filled 2021-09-01: qty 2

## 2021-09-01 MED ORDER — HYDROMORPHONE HCL 1 MG/ML IJ SOLN
1.0000 mg | INTRAMUSCULAR | Status: DC | PRN
  Administered 2021-09-01: 1 mg via INTRAVENOUS
  Filled 2021-09-01: qty 1

## 2021-09-01 MED ORDER — BUPROPION HCL ER (SR) 100 MG PO TB12
200.0000 mg | ORAL_TABLET | Freq: Two times a day (BID) | ORAL | Status: DC
Start: 2021-09-02 — End: 2021-09-02

## 2021-09-01 MED ORDER — SERTRALINE HCL 50 MG PO TABS
50.0000 mg | ORAL_TABLET | Freq: Every day | ORAL | Status: DC
  Administered 2021-09-02 – 2021-09-03 (×2): 50 mg via ORAL
  Filled 2021-09-01 (×2): qty 1

## 2021-09-01 MED ORDER — ACETAMINOPHEN 500 MG PO TABS
1000.0000 mg | ORAL_TABLET | Freq: Once | ORAL | Status: AC
  Administered 2021-09-01: 1000 mg via ORAL
  Filled 2021-09-01: qty 2

## 2021-09-01 MED ORDER — LABETALOL HCL 100 MG PO TABS
200.0000 mg | ORAL_TABLET | Freq: Two times a day (BID) | ORAL | Status: DC
  Administered 2021-09-02: 200 mg via ORAL
  Filled 2021-09-01 (×2): qty 2

## 2021-09-01 MED ORDER — LACTATED RINGERS IV SOLN: INTRAVENOUS | Status: DC

## 2021-09-01 MED ORDER — DOCUSATE SODIUM 100 MG PO CAPS
100.0000 mg | ORAL_CAPSULE | Freq: Every day | ORAL | Status: DC
  Administered 2021-09-02 – 2021-09-03 (×2): 100 mg via ORAL
  Filled 2021-09-01 (×2): qty 1

## 2021-09-01 MED ORDER — PRENATAL MULTIVITAMIN CH
1.0000 | ORAL_TABLET | Freq: Every day | ORAL | Status: DC
  Administered 2021-09-02 – 2021-09-03 (×2): 1 via ORAL
  Filled 2021-09-01 (×2): qty 1

## 2021-09-01 MED ORDER — HYDROMORPHONE HCL 1 MG/ML IJ SOLN
1.0000 mg | Freq: Once | INTRAMUSCULAR | Status: AC
  Administered 2021-09-01: 1 mg via INTRAVENOUS
  Filled 2021-09-01: qty 1

## 2021-09-01 MED ORDER — TAMSULOSIN HCL 0.4 MG PO CAPS
0.4000 mg | ORAL_CAPSULE | Freq: Every day | ORAL | Status: DC
  Administered 2021-09-02 – 2021-09-03 (×2): 0.4 mg via ORAL
  Filled 2021-09-01 (×2): qty 1

## 2021-09-01 MED ORDER — CALCIUM CARBONATE ANTACID 500 MG PO CHEW: 2.0000 | CHEWABLE_TABLET | ORAL | Status: DC | PRN

## 2021-09-01 MED ORDER — ZOLPIDEM TARTRATE 5 MG PO TABS: 5.0000 mg | ORAL_TABLET | Freq: Every evening | ORAL | Status: DC | PRN

## 2021-09-01 NOTE — MAU Provider Note (Signed)
History     CSN: [id number]  Arrival date and time: 09/01/21 1925   Event Date/Time   First Provider Initiated Contact with Patient 09/01/21 2027      Chief Complaint  Patient presents with   Abdominal Pain   Back Pain   HPI  Carol Pugh is a 26 y.o. female G2P1001 @ [redacted]w[redacted]d  here in MAU with complaints of lower back pain. The pain is in her right lower back, the pain comes and goes. The pain wraps around to the front of her abdomen. No fever. The pain does radiate toward the middle of her abdomen. At times the pain in her abdomen feels like pressure. She does have a history of CHTh, and kidney stones.   OB History     Gravida  2   Para  1   Term  1   Preterm  0   AB  0   Living  1      SAB  0   IAB  0   Ectopic  0   Multiple  0   Live Births  1           Past Medical History:  Diagnosis Date   Anxiety    Depression    Distal renal tubular acidosis    Family history of Marfan syndrome    Headache    Heart murmur    History of kidney stones    Hypertension    Kidney stones    Marfan syndrome    Migraine    PCOS (polycystic ovarian syndrome)     Past Surgical History:  Procedure Laterality Date   EXTRACORPOREAL SHOCK WAVE LITHOTRIPSY Left 06/11/2017   Procedure: LEFT EXTRACORPOREAL SHOCK WAVE LITHOTRIPSY (ESWL);  Surgeon: Heloise Purpura, MD;  Location: [facility];  Service: Urology;  Laterality: Left;   KNEE ARTHROSCOPY WITH ANTERIOR CRUCIATE LIGAMENT (ACL) REPAIR WITH HAMSTRING GRAFT Right 10/19/2017   Procedure: RIGHT KNEE ARTHROSCOPY WITH ANTERIOR CRUCIATE LIGAMENT (ACL) REPAIR WITH AUTOGRAFT HAMSTRING;  Surgeon: Bjorn Pippin, MD;  Location: [facility];  Service: Orthopedics;  Laterality: Right;   MEDIAL PATELLOFEMORAL LIGAMENT REPAIR Right 10/19/2017   Procedure: MEDIAL PATELLA FEMORAL LIGAMENT RECONSTRUCTION RIGHT KNEE;  Surgeon: Bjorn Pippin, MD;  Location: [facility];  Service: Orthopedics;  Laterality:  Right;   urinary stent placements      Family History  Problem Relation Age of Onset   Marfan syndrome Father    Ovarian cancer Paternal Grandmother     Social History   Tobacco Use   Smoking status: Former    Packs/day: 0.25    Years: 5.00    Total pack years: 1.25    Types: Cigarettes   Smokeless tobacco: Never  Vaping Use   Vaping Use: Former   Substances: Nicotine  Substance Use Topics   Alcohol use: Not Currently   Drug use: Never    Allergies:  Allergies  Allergen Reactions   Bee Venom Hives and Swelling    Medications Prior to Admission  Medication Sig Dispense Refill Last Dose   aspirin EC 81 MG tablet Take 81 mg by mouth daily. Swallow whole.   09/01/2021   buPROPion (WELLBUTRIN SR) 200 MG 12 hr tablet TAKE 1 TABLET BY MOUTH TWICE DAILY 60 tablet 3 09/01/2021   labetalol (NORMODYNE) 200 MG tablet TAKE 1 TABLET BY MOUTH TWICE DAILY 60 tablet 3 09/01/2021   ondansetron (ZOFRAN) 4 MG tablet Take 4 mg by mouth every 8 (eight)  hours as needed for nausea or vomiting.   09/01/2021   Prenatal Vit-Fe Fumarate-FA (MULTIVITAMIN-PRENATAL) 27-0.8 MG TABS tablet Take 1 tablet by mouth daily at 12 noon.   08/31/2021   promethazine (PHENERGAN) 12.5 MG tablet Take 12.5 mg by mouth every 6 (six) hours as needed for nausea or vomiting.   Past Week   sertraline (ZOLOFT) 50 MG tablet Take 50 mg by mouth daily.   09/01/2021   SUMAtriptan (IMITREX) 100 MG tablet Take 100 mg by mouth every 2 (two) hours as needed for migraine. May repeat in 2 hours if headache persists or recurs.   Past Month   Butalbital-APAP-Caffeine 50-300-40 MG CAPS Take 1 capsule by mouth as needed. (Patient not taking: Reported on 05/29/2021)      EPINEPHrine 0.3 mg/0.3 mL IJ SOAJ injection Inject into the muscle.       Review of Systems Physical Exam   Blood pressure 128/81, pulse 92, temperature 97.7 F (36.5 C), temperature source Oral, resp. rate 18, height 5\' 10"  (1.778 m), weight 117.3 kg, last menstrual  period 12/25/2020, SpO2 97 %, unknown if currently breastfeeding.  Physical Exam  MAU Course  Procedures  MDM ***  Assessment and Plan  ***  Carol Pugh 09/01/2021, 8:30 PM

## 2021-09-01 NOTE — MAU Note (Signed)
.  Carol Pugh is a 26 y.o. at [redacted]w[redacted]d here in MAU reporting: intermittent bilateral lower abdominal pain that radiates to her lower back, this pain started about an hour (4/10). Pain is described as sharp/dull, pain is better with movement. Denies VB or LOF. Reports good FM. Denies HA, visual changes, RUQ pain, or abnormal swelling.   She is unsure if this pain is related to her kidney stones. She has a hx of kidney stones and had an [name] that showed stones about a month ago.   Onset of complaint: today  Pain score: 4/10 Vitals:   09/01/21 1935  BP: (!) 145/78  Pulse: 92  Resp: 18  Temp: 97.7 F (36.5 C)  SpO2: 97%     FXJ:[id number]; pt wearing dress Lab orders placed from triage:  UA

## 2021-09-02 ENCOUNTER — Encounter (HOSPITAL_COMMUNITY)
Admission: AD | Disposition: A | Discharge status: Home / Self Care | Payer: Self-pay | Source: Home / Self Care | Attending: Obstetrics and Gynecology

## 2021-09-02 ENCOUNTER — Observation Stay (HOSPITAL_COMMUNITY): Payer: No Typology Code available for payment source

## 2021-09-02 ENCOUNTER — Observation Stay (HOSPITAL_COMMUNITY): Payer: No Typology Code available for payment source | Admitting: Anesthesiology

## 2021-09-02 ENCOUNTER — Encounter (HOSPITAL_COMMUNITY): Payer: Self-pay | Admitting: Obstetrics and Gynecology

## 2021-09-02 DIAGNOSIS — O10013 Pre-existing essential hypertension complicating pregnancy, third trimester: Secondary | ICD-10-CM | POA: Diagnosis present

## 2021-09-02 DIAGNOSIS — Z87442 Personal history of urinary calculi: Secondary | ICD-10-CM | POA: Diagnosis not present

## 2021-09-02 DIAGNOSIS — Z3A34 34 weeks gestation of pregnancy: Secondary | ICD-10-CM | POA: Diagnosis not present

## 2021-09-02 DIAGNOSIS — Z8279 Family history of other congenital malformations, deformations and chromosomal abnormalities: Secondary | ICD-10-CM | POA: Diagnosis not present

## 2021-09-02 DIAGNOSIS — O99343 Other mental disorders complicating pregnancy, third trimester: Secondary | ICD-10-CM | POA: Diagnosis present

## 2021-09-02 DIAGNOSIS — N132 Hydronephrosis with renal and ureteral calculous obstruction: Secondary | ICD-10-CM | POA: Diagnosis present

## 2021-09-02 DIAGNOSIS — Q8740 Marfan's syndrome, unspecified: Secondary | ICD-10-CM | POA: Diagnosis not present

## 2021-09-02 DIAGNOSIS — R109 Unspecified abdominal pain: Secondary | ICD-10-CM | POA: Diagnosis present

## 2021-09-02 DIAGNOSIS — N200 Calculus of kidney: Secondary | ICD-10-CM

## 2021-09-02 DIAGNOSIS — O26833 Pregnancy related renal disease, third trimester: Secondary | ICD-10-CM | POA: Diagnosis present

## 2021-09-02 DIAGNOSIS — Z87891 Personal history of nicotine dependence: Secondary | ICD-10-CM | POA: Diagnosis not present

## 2021-09-02 DIAGNOSIS — F32A Depression, unspecified: Secondary | ICD-10-CM | POA: Diagnosis present

## 2021-09-02 DIAGNOSIS — F419 Anxiety disorder, unspecified: Secondary | ICD-10-CM | POA: Diagnosis present

## 2021-09-02 HISTORY — PX: CYSTOSCOPY W/ URETERAL STENT PLACEMENT: SHX1429

## 2021-09-02 LAB — TYPE AND SCREEN
ABO/RH(D): A POS
Antibody Screen: NEGATIVE

## 2021-09-02 SURGERY — CYSTOSCOPY, WITH RETROGRADE PYELOGRAM AND URETERAL STENT INSERTION
Anesthesia: General | Laterality: Right

## 2021-09-02 MED ORDER — LABETALOL HCL 100 MG PO TABS
100.0000 mg | ORAL_TABLET | Freq: Once | ORAL | Status: AC
  Administered 2021-09-02: 100 mg via ORAL
  Filled 2021-09-02: qty 1

## 2021-09-02 MED ORDER — OXYCODONE HCL 5 MG PO TABS: 5.0000 mg | ORAL_TABLET | Freq: Once | ORAL | Status: DC | PRN

## 2021-09-02 MED ORDER — DEXAMETHASONE SODIUM PHOSPHATE 10 MG/ML IJ SOLN
INTRAMUSCULAR | Status: AC
  Filled 2021-09-02: qty 1

## 2021-09-02 MED ORDER — LIDOCAINE 2% (20 MG/ML) 5 ML SYRINGE
INTRAMUSCULAR | Status: AC
  Filled 2021-09-02: qty 5

## 2021-09-02 MED ORDER — LIDOCAINE 2% (20 MG/ML) 5 ML SYRINGE
INTRAMUSCULAR | Status: DC | PRN
  Administered 2021-09-02: 50 mg via INTRAVENOUS

## 2021-09-02 MED ORDER — CEFAZOLIN SODIUM 1 G IJ SOLR
INTRAMUSCULAR | Status: AC
  Filled 2021-09-02: qty 20

## 2021-09-02 MED ORDER — CEFAZOLIN SODIUM-DEXTROSE 2-3 GM-%(50ML) IV SOLR
INTRAVENOUS | Status: DC | PRN
  Administered 2021-09-02: 2 g via INTRAVENOUS

## 2021-09-02 MED ORDER — FENTANYL CITRATE (PF) 100 MCG/2ML IJ SOLN: 25.0000 ug | INTRAMUSCULAR | Status: DC | PRN

## 2021-09-02 MED ORDER — ONDANSETRON HCL 4 MG/2ML IJ SOLN
INTRAMUSCULAR | Status: AC
  Filled 2021-09-02: qty 2

## 2021-09-02 MED ORDER — FENTANYL CITRATE (PF) 100 MCG/2ML IJ SOLN
INTRAMUSCULAR | Status: DC | PRN
Start: 2021-09-02 — End: 2021-09-02
  Administered 2021-09-02: 100 ug via INTRAVENOUS

## 2021-09-02 MED ORDER — DEXAMETHASONE SODIUM PHOSPHATE 10 MG/ML IJ SOLN
INTRAMUSCULAR | Status: DC | PRN
  Administered 2021-09-02: 5 mg via INTRAVENOUS

## 2021-09-02 MED ORDER — SODIUM CHLORIDE 0.9 % IR SOLN
Status: DC | PRN
  Administered 2021-09-02: 3000 mL

## 2021-09-02 MED ORDER — CHLORHEXIDINE GLUCONATE 0.12 % MT SOLN
OROMUCOSAL | Status: AC
  Administered 2021-09-02: 15 mL via OROMUCOSAL
  Filled 2021-09-02: qty 15

## 2021-09-02 MED ORDER — ONDANSETRON HCL 4 MG/2ML IJ SOLN: 4.0000 mg | Freq: Four times a day (QID) | INTRAMUSCULAR | Status: DC | PRN

## 2021-09-02 MED ORDER — AMISULPRIDE (ANTIEMETIC) 5 MG/2ML IV SOLN
10.0000 mg | Freq: Once | INTRAVENOUS | Status: DC
  Administered 2021-09-02: 10 mg via INTRAVENOUS

## 2021-09-02 MED ORDER — HYDROMORPHONE HCL 1 MG/ML IJ SOLN
1.0000 mg | INTRAMUSCULAR | Status: DC | PRN
  Administered 2021-09-02 – 2021-09-03 (×4): 1 mg via INTRAVENOUS
  Filled 2021-09-02 (×5): qty 1

## 2021-09-02 MED ORDER — ONDANSETRON HCL 4 MG/2ML IJ SOLN
INTRAMUSCULAR | Status: DC | PRN
  Administered 2021-09-02: 4 mg via INTRAVENOUS

## 2021-09-02 MED ORDER — SUCCINYLCHOLINE CHLORIDE 200 MG/10ML IV SOSY
PREFILLED_SYRINGE | INTRAVENOUS | Status: DC | PRN
  Administered 2021-09-02: 130 mg via INTRAVENOUS

## 2021-09-02 MED ORDER — PROPOFOL 10 MG/ML IV BOLUS
INTRAVENOUS | Status: DC | PRN
  Administered 2021-09-02: 160 mg via INTRAVENOUS

## 2021-09-02 MED ORDER — PROPOFOL 10 MG/ML IV BOLUS
INTRAVENOUS | Status: AC
  Filled 2021-09-02: qty 20

## 2021-09-02 MED ORDER — LABETALOL HCL 200 MG PO TABS
200.0000 mg | ORAL_TABLET | Freq: Every day | ORAL | Status: DC
  Administered 2021-09-02: 200 mg via ORAL
  Filled 2021-09-02: qty 1

## 2021-09-02 MED ORDER — FENTANYL CITRATE (PF) 250 MCG/5ML IJ SOLN
INTRAMUSCULAR | Status: AC
  Filled 2021-09-02: qty 5

## 2021-09-02 MED ORDER — ORAL CARE MOUTH RINSE: 15.0000 mL | Freq: Once | OROMUCOSAL | Status: AC

## 2021-09-02 MED ORDER — OXYCODONE HCL 5 MG/5ML PO SOLN: 5.0000 mg | Freq: Once | ORAL | Status: DC | PRN

## 2021-09-02 MED ORDER — BUPROPION HCL ER (SR) 100 MG PO TB12
200.0000 mg | ORAL_TABLET | Freq: Two times a day (BID) | ORAL | Status: DC
  Administered 2021-09-02 – 2021-09-03 (×4): 200 mg via ORAL
  Filled 2021-09-02 (×5): qty 2

## 2021-09-02 MED ORDER — IOHEXOL 300 MG/ML  SOLN
INTRAMUSCULAR | Status: DC | PRN
  Administered 2021-09-02: 18 mL

## 2021-09-02 MED ORDER — CHLORHEXIDINE GLUCONATE 0.12 % MT SOLN: 15.0000 mL | Freq: Once | OROMUCOSAL | Status: AC

## 2021-09-02 MED ORDER — LACTATED RINGERS IV SOLN: INTRAVENOUS | Status: DC

## 2021-09-02 MED ORDER — AMISULPRIDE (ANTIEMETIC) 5 MG/2ML IV SOLN
INTRAVENOUS | Status: AC
  Filled 2021-09-02: qty 4

## 2021-09-02 SURGICAL SUPPLY — 23 items
BAG DRN RND TRDRP ANRFLXCHMBR (UROLOGICAL SUPPLIES) ×1
BAG URINE DRAIN 2000ML AR STRL (UROLOGICAL SUPPLIES) ×1 IMPLANT
BAG URO CATCHER STRL LF (MISCELLANEOUS) ×1 IMPLANT
CATH FOLEY 2WAY SLVR  5CC 16FR (CATHETERS)
CATH FOLEY 2WAY SLVR 5CC 16FR (CATHETERS) IMPLANT
CATH URET 5FR 28IN OPEN ENDED (CATHETERS) ×1 IMPLANT
GLOVE BIO SURGEON STRL SZ7.5 (GLOVE) ×1 IMPLANT
GOWN STRL REUS W/ TWL LRG LVL3 (GOWN DISPOSABLE) ×1 IMPLANT
GOWN STRL REUS W/ TWL XL LVL3 (GOWN DISPOSABLE) ×1 IMPLANT
GOWN STRL REUS W/TWL LRG LVL3 (GOWN DISPOSABLE) ×1
GOWN STRL REUS W/TWL XL LVL3 (GOWN DISPOSABLE) ×1
GUIDEWIRE ANG ZIPWIRE 038X150 (WIRE) IMPLANT
GUIDEWIRE STR DUAL SENSOR (WIRE) ×1 IMPLANT
KIT TURNOVER KIT B (KITS) ×1 IMPLANT
MANIFOLD NEPTUNE II (INSTRUMENTS) IMPLANT
NS IRRIG 1000ML POUR BTL (IV SOLUTION) IMPLANT
PACK CYSTO (CUSTOM PROCEDURE TRAY) ×1 IMPLANT
STENT URET 6FRX24 CONTOUR (STENTS) IMPLANT
STENT URET 6FRX26 CONTOUR (STENTS) IMPLANT
SYPHON OMNI JUG (MISCELLANEOUS) ×1 IMPLANT
TOWEL GREEN STERILE FF (TOWEL DISPOSABLE) ×1 IMPLANT
TUBE CONNECTING 12X1/4 (SUCTIONS) IMPLANT
WATER STERILE IRR 3000ML UROMA (IV SOLUTION) ×1 IMPLANT

## 2021-09-02 NOTE — Anesthesia Procedure Notes (Signed)
Procedure Name: Intubation Date/Time: 09/02/2021 7:58 PM  Performed by: Barrington Ellison, CRNAPre-anesthesia Checklist: Patient identified, Emergency Drugs available, Suction available and Patient being monitored Patient Re-evaluated:Patient Re-evaluated prior to induction Oxygen Delivery Method: Circle System Utilized Preoxygenation: Pre-oxygenation with 100% oxygen Induction Type: IV induction, Rapid sequence and Cricoid Pressure applied Laryngoscope Size: Mac and 3 Grade View: Grade I Tube type: Oral Tube size: 7.0 mm Number of attempts: 1 Airway Equipment and Method: Stylet and Oral airway Placement Confirmation: ETT inserted through vocal cords under direct vision, positive ETCO2 and breath sounds checked- equal and bilateral Secured at: 22 cm Tube secured with: Tape Dental Injury: Teeth and Oropharynx as per pre-operative assessment

## 2021-09-02 NOTE — Consult Note (Signed)
Urology Consult   Physician requesting consult: Clance Boll, MD  Reason for consult: Right foot pain  History of Present Illness: Carol Pugh is a 26 y.o. female currently at [redacted] weeks gestation who presented to the labor and delivery emergency department with acute right-sided flank pain that started approximately 24 hours ago.  Renal ultrasound revealed moderate right-sided hydronephrosis along with a hyperechoic area in the proximal aspects of the right ureter concerning for a 1 cm obstructing stone.  She has a long history of nephrolithiasis and has required surgery in the past.  Currently, the patient is resting comfortably in bed, but has had intermittent episodes of sharp right-sided flank pain requiring IV pain medication.  She denies any nausea/vomiting, fever/chills, dysuria or gross hematuria.    Past Medical History:  Diagnosis Date   Anxiety    Depression    Distal renal tubular acidosis    Family history of Marfan syndrome    Headache    Heart murmur    History of kidney stones    Hypertension    Kidney stones    Marfan syndrome    Migraine    PCOS (polycystic ovarian syndrome)     Past Surgical History:  Procedure Laterality Date   EXTRACORPOREAL SHOCK WAVE LITHOTRIPSY Left 06/11/2017   Procedure: LEFT EXTRACORPOREAL SHOCK WAVE LITHOTRIPSY (ESWL);  Surgeon: Heloise Purpura, MD;  Location: [facility];  Service: Urology;  Laterality: Left;   FRACTURE SURGERY     KNEE ARTHROSCOPY WITH ANTERIOR CRUCIATE LIGAMENT (ACL) REPAIR WITH HAMSTRING GRAFT Right 10/19/2017   Procedure: RIGHT KNEE ARTHROSCOPY WITH ANTERIOR CRUCIATE LIGAMENT (ACL) REPAIR WITH AUTOGRAFT HAMSTRING;  Surgeon: Bjorn Pippin, MD;  Location: [facility];  Service: Orthopedics;  Laterality: Right;   MEDIAL PATELLOFEMORAL LIGAMENT REPAIR Right 10/19/2017   Procedure: MEDIAL PATELLA FEMORAL LIGAMENT RECONSTRUCTION RIGHT KNEE;  Surgeon: Bjorn Pippin, MD;  Location: [facility];   Service: Orthopedics;  Laterality: Right;   urinary stent placements      Current Hospital Medications:  Home Meds:  Current Meds  Medication Sig   aspirin EC 81 MG tablet Take 81 mg by mouth daily. Swallow whole.   buPROPion (WELLBUTRIN SR) 200 MG 12 hr tablet TAKE 1 TABLET BY MOUTH TWICE DAILY   labetalol (NORMODYNE) 200 MG tablet TAKE 1 TABLET BY MOUTH TWICE DAILY   ondansetron (ZOFRAN) 4 MG tablet Take 4 mg by mouth every 8 (eight) hours as needed for nausea or vomiting.   Prenatal Vit-Fe Fumarate-FA (MULTIVITAMIN-PRENATAL) 27-0.8 MG TABS tablet Take 1 tablet by mouth daily at 12 noon.   promethazine (PHENERGAN) 12.5 MG tablet Take 12.5 mg by mouth every 6 (six) hours as needed for nausea or vomiting.   sertraline (ZOLOFT) 50 MG tablet Take 50 mg by mouth daily.   SUMAtriptan (IMITREX) 100 MG tablet Take 100 mg by mouth every 2 (two) hours as needed for migraine. May repeat in 2 hours if headache persists or recurs.    Scheduled Meds:  aspirin EC  81 mg Oral Daily   buPROPion  200 mg Oral BID   docusate sodium  100 mg Oral Daily   labetalol  200 mg Oral Daily   prenatal multivitamin  1 tablet Oral Q1200   sertraline  50 mg Oral Daily   tamsulosin  0.4 mg Oral Daily   Continuous Infusions:  lactated ringers 125 mL/hr at 09/02/21 1235   PRN Meds:.acetaminophen, calcium carbonate, HYDROmorphone, ondansetron, zolpidem  Allergies:  Allergies  Allergen  Reactions   Bee Venom Anaphylaxis, Hives and Swelling    Family History  Problem Relation Age of Onset   Marfan syndrome Father    Ovarian cancer Paternal Grandmother     Social History:  reports that she has quit smoking. Her smoking use included cigarettes. She has a 1.25 pack-year smoking history. She has never used smokeless tobacco. She reports that she does not currently use alcohol. She reports that she does not use drugs.  ROS: A complete review of systems was performed.  All systems are negative except for  pertinent findings as noted.  Physical Exam:  Vital signs in last 24 hours: Temp:  [97.7 F (36.5 C)] 97.7 F (36.5 C) (08/21 1229) Pulse Rate:  [81-97] 92 (08/21 1229) Resp:  [14-19] 19 (08/21 1229) BP: (91-145)/(50-89) 116/63 (08/21 1229) SpO2:  [96 %-100 %] 99 % (08/21 1229) Weight:  [117.3 kg] 117.3 kg (08/20 1935) Constitutional:  Alert and oriented, No acute distress Cardiovascular: Regular rate and rhythm, No JVD Respiratory: Normal respiratory effort, Lungs clear bilaterally GI: Abdomen is soft, nontender, nondistended, no abdominal masses GU: No CVA tenderness Lymphatic: No lymphadenopathy Neurologic: Grossly intact, no focal deficits Psychiatric: Normal mood and affect  Laboratory Data:  Recent Labs    09/01/21 2041  WBC 9.0  HGB 12.1  HCT 37.2  PLT 241    Recent Labs    09/01/21 2041  NA 135  K 3.9  CL 105  GLUCOSE 83  BUN 8  CALCIUM 8.9  CREATININE 0.67     Results for orders placed or performed during the hospital encounter of 09/01/21 (from the past 24 hour(s))  Urinalysis, Routine w reflex microscopic     Status: Abnormal   Collection Time: 09/01/21  7:54 PM  Result Value Ref Range   Color, Urine STRAW (A) YELLOW   APPearance CLOUDY (A) CLEAR   Specific Gravity, Urine <1.005 (L) 1.005 - 1.030   pH 7.0 5.0 - 8.0   Glucose, UA NEGATIVE NEGATIVE mg/dL   Hgb urine dipstick LARGE (A) NEGATIVE   Bilirubin Urine NEGATIVE NEGATIVE   Ketones, ur NEGATIVE NEGATIVE mg/dL   Protein, ur NEGATIVE NEGATIVE mg/dL   Nitrite NEGATIVE NEGATIVE   Leukocytes,Ua LARGE (A) NEGATIVE  Urinalysis, Microscopic (reflex)     Status: Abnormal   Collection Time: 09/01/21  7:54 PM  Result Value Ref Range   RBC / HPF >50 0 - 5 RBC/hpf   WBC, UA 21-50 0 - 5 WBC/hpf   Bacteria, UA FEW (A) NONE SEEN   Squamous Epithelial / LPF 21-50 0 - 5   Mucus PRESENT   Comprehensive metabolic panel     Status: Abnormal   Collection Time: 09/01/21  8:41 PM  Result Value Ref Range    Sodium 135 135 - 145 mmol/L   Potassium 3.9 3.5 - 5.1 mmol/L   Chloride 105 98 - 111 mmol/L   CO2 22 22 - 32 mmol/L   Glucose, Bld 83 70 - 99 mg/dL   BUN 8 6 - 20 mg/dL   Creatinine, Ser 2.83 0.44 - 1.00 mg/dL   Calcium 8.9 8.9 - 66.2 mg/dL   Total Protein 5.6 (L) 6.5 - 8.1 g/dL   Albumin 2.6 (L) 3.5 - 5.0 g/dL   AST 17 15 - 41 U/L   ALT 18 0 - 44 U/L   Alkaline Phosphatase 96 38 - 126 U/L   Total Bilirubin 0.7 0.3 - 1.2 mg/dL   GFR, Estimated >94 >76 mL/min  Anion gap 8 5 - 15  CBC with Differential/Platelet     Status: Abnormal   Collection Time: 09/01/21  8:41 PM  Result Value Ref Range   WBC 9.0 4.0 - 10.5 K/uL   RBC 4.08 3.87 - 5.11 MIL/uL   Hemoglobin 12.1 12.0 - 15.0 g/dL   HCT 71.2 45.8 - 09.9 %   MCV 91.2 80.0 - 100.0 fL   MCH 29.7 26.0 - 34.0 pg   MCHC 32.5 30.0 - 36.0 g/dL   RDW 83.3 82.5 - 05.3 %   Platelets 241 150 - 400 K/uL   nRBC 0.0 0.0 - 0.2 %   Neutrophils Relative % 60 %   Neutro Abs 5.4 1.7 - 7.7 K/uL   Lymphocytes Relative 30 %   Lymphs Abs 2.7 0.7 - 4.0 K/uL   Monocytes Relative 8 %   Monocytes Absolute 0.7 0.1 - 1.0 K/uL   Eosinophils Relative 1 %   Eosinophils Absolute 0.1 0.0 - 0.5 K/uL   Basophils Relative 0 %   Basophils Absolute 0.0 0.0 - 0.1 K/uL   Immature Granulocytes 1 %   Abs Immature Granulocytes 0.10 (H) 0.00 - 0.07 K/uL  Type and screen [facility]     Status: None   Collection Time: 09/02/21  7:54 AM  Result Value Ref Range   ABO/RH(D) A POS    Antibody Screen NEG    Sample Expiration      09/05/2021,2359 Performed at [facility], [address], [place], [place] [address]    No results found for this or any previous visit (from the past 240 hour(s)).  Renal Function: Recent Labs    09/01/21 2041  CREATININE 0.67   Estimated Creatinine Clearance: 148 mL/min (by C-G formula based on SCr of 0.67 mg/dL).  Radiologic Imaging: US RENAL  Result Date: 09/01/2021 CLINICAL DATA:  Back pain, pelvic  pain, [redacted] weeks pregnant. EXAM: RENAL / URINARY TRACT ULTRASOUND COMPLETE COMPARISON:  07/17/2021. FINDINGS: Right Kidney: Renal measurements: 12.3 x 5.3 x 7.2 cm = volume: 245.9 mL. Echogenicity within normal limits. Multiple renal calculi are noted, the largest measuring 1 cm. There is mild hydronephrosis. A stone is noted in the proximal right ureter measuring 1.0 x 0.4 x 1.0 cm. Left Kidney: Renal measurements: 12.1 x 6.8 x 6.2 cm = volume: 266.9 mL. Echogenicity within normal limits. Multiple renal calculi, largest measuring 1 cm. No mass or hydronephrosis visualized. Bladder: Appears normal for degree of bladder distention. Bilateral ureteral jets are present. Other: Gravid uterus is noted. IMPRESSION: 1. Mild hydronephrosis on the right with a 1 cm calculus in the proximal right ureter. 2. Bilateral nephrolithiasis. 3. Gravid uterus. Electronically Signed   By: Thornell Sartorius M.D.   On: 09/01/2021 22:10    I independently reviewed the above imaging studies.  Impression/Recommendation 26 year old female that is currently at [redacted] weeks gestation with right renal colic likely secondary to an obstructing right proximal ureteral stone  -The risks, benefits and alternatives of cystoscopy with RIGHT JJ stent placement was discussed with the patient.  Risks include, but are not limited to: Preterm labor, fetal loss, bleeding, urinary tract infection, ureteral injury, ureteral stricture disease, chronic pain, urinary symptoms, bladder injury, stent migration, the need for nephrostomy tube placement, MI, CVA, DVT, PE and the inherent risks with general anesthesia.  The patient voices understanding and wishes to proceed.  -Urine culture and sensitivity pending  Rhoderick Moody, MD [facility] 09/02/2021, 4:25 PM

## 2021-09-02 NOTE — Progress Notes (Signed)
Patient seen and examined at bedside s/p surgery with Urology, Dr. Liliane Shi performed cystoscopy with right reteral stent placement for right 1 cm proximal ureteral stone. Uncomplicated procedure performed under general anesthesia. Patient reports some mild right sided discomfort but better than what had been previously. No appreciable FM yet. Patient on EFM/Toco during discussion. Reassuring Category I tracing with 125 bpm baseline, moderate variability, no accelerations, no decelerations. There is some uterine irritability with irregular contractions on Tocometry however patient denies feeling them and holds a normal conversation throughout. Plan for 1 hour of continuous fetal monitoring before returning to q shift monitoring. Patient aware to notify RN if having any change in pain, cramping, or contractions.   Arjen Deringer A Zalia Hautala 09/02/21 10:17 PM

## 2021-09-02 NOTE — Transfer of Care (Signed)
Immediate Anesthesia Transfer of Care Note  Patient: Carol Pugh  Procedure(s) Performed: CYSTOSCOPY WITH RETROGRADE PYELOGRAM/URETERAL STENT PLACEMENT (Right)  Patient Location: PACU  Anesthesia Type:General  Level of Consciousness: awake and oriented  Airway & Oxygen Therapy: Patient Spontanous Breathing  Post-op Assessment: Report given to RN  Post vital signs: Reviewed and stable  Last Vitals:  Vitals Value Taken Time  BP 118/89 09/02/21 2037  Temp    Pulse 92 09/02/21 2038  Resp 17 09/02/21 2038  SpO2 91 % 09/02/21 2038  Vitals shown include unvalidated device data.  Last Pain:  Vitals:   09/02/21 1920  TempSrc: Oral  PainSc: 0-No pain      Patients Stated Pain Goal: 3 (09/02/21 1453)  Complications: No notable events documented.

## 2021-09-02 NOTE — Anesthesia Preprocedure Evaluation (Signed)
Anesthesia Evaluation  Patient identified by MRN, date of birth, ID band Patient awake    Reviewed: Allergy & Precautions, H&P , NPO status , Patient's Chart, lab work & pertinent test results  Airway Mallampati: II   Neck ROM: full    Dental   Pulmonary former smoker,    breath sounds clear to auscultation       Cardiovascular hypertension,  Rhythm:regular Rate:Normal     Neuro/Psych  Headaches, PSYCHIATRIC DISORDERS Anxiety Depression    GI/Hepatic   Endo/Other  obese  Renal/GU Renal diseasestones     Musculoskeletal   Abdominal   Peds  Hematology   Anesthesia Other Findings   Reproductive/Obstetrics Polycystic ovaries                             Anesthesia Physical Anesthesia Plan  ASA: 2  Anesthesia Plan: General   Post-op Pain Management:    Induction: Intravenous  PONV Risk Score and Plan: 3 and Ondansetron, Dexamethasone, Midazolam and Treatment may vary due to age or medical condition  Airway Management Planned: LMA  Additional Equipment:   Intra-op Plan:   Post-operative Plan: Extubation in OR  Informed Consent: I have reviewed the patients History and Physical, chart, labs and discussed the procedure including the risks, benefits and alternatives for the proposed anesthesia with the patient or authorized representative who has indicated his/her understanding and acceptance.     Dental advisory given  Plan Discussed with: CRNA, Anesthesiologist and Surgeon  Anesthesia Plan Comments:         Anesthesia Quick Evaluation

## 2021-09-02 NOTE — H&P (Signed)
Carol Pugh is a 26 y.o. female G2P1001 [redacted]w[redacted]d presenting for right flank pain. Patient had called answering service reporting right sided pain that had been coming and going for several hours. Some radiating of pain anteriorly to the front of her abdomen and some in the low back. Difficult to determine if preterm labor versus other over the phone and was advised to report to MAU for further evaluation. During MAU evaluation flank pain significantly worsened and required IV pain medication intervention. Patient has known long term history of kidney stones and was sent for renal study. Renal US significant for bilateral stones with largest measured 1 cm in the right proximal ureter with mild right hydronephrosis noted. Given size of stones and poor control of patient pain, she was admitted for observation.  Pregnancy complicated by cHTN, anxiety/depression, and recent diagnosis of Marfan's syndrome. HTN has been well controlled on PO Labetalol 100 mg TID. Has also been on risk reducing ASA 81 mg daily. Anxiety/Depression well controlled on Zoloft 50 mg daily. Patient had genetic testing for Marfan's syndrome prior to the pregnancy after father was diagnosed and passed away age 26Y with aortic root aneurysm. She has been seen by genetic counseling, MFM, and Cardiology. Patient had normal fetal anatomy scan with MFM and susequently followed with serial growth scans in our office. Most recent growth at 30w showed EFW 88% (4#5) and scheduled for next growth this week. Patient seen at [facility] Cardiology with Dr. Nicolette Bang for serial aortic root measurements, last done 6/9 stable at 37-38 mm. Patient states she was scheduled to see Urology through [facility] next week but has not had established care with them. Previously seen through [facility] Urologists in the past. Patient estimates this is the 30th kidney stone flare she has had, with multiple lithotripsy procedures done in the past, most recently done in 2019. Usually tries to  self manage and has prescriptions for Flomax and Oxycodone to use PRN at home. Last pregnancy was induced at 37 weeks due to ongoing kidney stone pain and elevations in BP.   Prenatal care at [facility] with Dr. Billy Coast as primary.  OB History     Gravida  2   Para  1   Term  1   Preterm  0   AB  0   Living  1      SAB  0   IAB  0   Ectopic  0   Multiple  0   Live Births  1          Past Medical History:  Diagnosis Date   Anxiety    Depression    Distal renal tubular acidosis    Family history of Marfan syndrome    Headache    Heart murmur    History of kidney stones    Hypertension    Kidney stones    Marfan syndrome    Migraine    PCOS (polycystic ovarian syndrome)    Past Surgical History:  Procedure Laterality Date   EXTRACORPOREAL SHOCK WAVE LITHOTRIPSY Left 06/11/2017   Procedure: LEFT EXTRACORPOREAL SHOCK WAVE LITHOTRIPSY (ESWL);  Surgeon: Heloise Purpura, MD;  Location: [facility];  Service: Urology;  Laterality: Left;   FRACTURE SURGERY     KNEE ARTHROSCOPY WITH ANTERIOR CRUCIATE LIGAMENT (ACL) REPAIR WITH HAMSTRING GRAFT Right 10/19/2017   Procedure: RIGHT KNEE ARTHROSCOPY WITH ANTERIOR CRUCIATE LIGAMENT (ACL) REPAIR WITH AUTOGRAFT HAMSTRING;  Surgeon: Bjorn Pippin, MD;  Location: [facility];  Service: Orthopedics;  Laterality: Right;   MEDIAL PATELLOFEMORAL LIGAMENT REPAIR Right 10/19/2017   Procedure: MEDIAL PATELLA FEMORAL LIGAMENT RECONSTRUCTION RIGHT KNEE;  Surgeon: Bjorn Pippin, MD;  Location: [facility];  Service: Orthopedics;  Laterality: Right;   urinary stent placements     Family History: family history includes Marfan syndrome in her father; Ovarian cancer in her paternal grandmother. Social History:  reports that she has quit smoking. Her smoking use included cigarettes. She has a 1.25 pack-year smoking history. She has never used smokeless tobacco. She reports that she does not currently use alcohol.  She reports that she does not use drugs.     Maternal Diabetes: No Genetic Screening: Normal Maternal Ultrasounds/Referrals: Normal Fetal Ultrasounds or other Referrals:  Referred to Materal Fetal Medicine  Maternal Substance Abuse:  No Significant Maternal Medications:  Meds include: Other:  Significant Maternal Lab Results:  None Other Comments:  None  Review of Systems  Gastrointestinal:  Positive for nausea.  Genitourinary:  Positive for flank pain.  All other systems reviewed and are negative.  Per HPI Maternal Exam:  Uterine Assessment: Contraction frequency is irregular.  Abdomen: Patient reports no abdominal tenderness. Fundal height is 34 week FH.     Fetal Exam Fetal Monitor Review: Baseline rate: 150.  Variability: moderate (6-25 bpm).   Pattern: accelerations present and no decelerations.   Fetal State Assessment: Category I - tracings are normal.   Physical Exam Vitals reviewed.  HENT:     Head: Normocephalic.  Cardiovascular:     Rate and Rhythm: Normal rate.  Pulmonary:     Effort: Pulmonary effort is normal.  Abdominal:     Palpations: Abdomen is soft.     Tenderness: There is no right CVA tenderness or left CVA tenderness.  Genitourinary:    Uterus: Enlarged. Not tender.   Skin:    General: Skin is warm and dry.  Neurological:     General: No focal deficit present.     Mental Status: She is alert.  Psychiatric:        Mood and Affect: Mood normal.        Behavior: Behavior normal.     Dilation: 1 Exam by:: Venia Carbon, NP Blood pressure (!) 91/50, pulse 90, temperature 97.7 F (36.5 C), temperature source Oral, resp. rate 14, height 5\' 10"  (1.778 m), weight 117.3 kg, last menstrual period 12/25/2020, SpO2 96 %, unknown if currently breastfeeding.  Prenatal labs: ABO, Rh:  --/--/A POS (08/21 0754) Antibody: NEG (08/21 0754)  Chemistry Recent Labs  Lab 09/01/21 2041  NA 135  K 3.9  CL 105  CO2 22  GLUCOSE 83  BUN 8  CREATININE  0.67  CALCIUM 8.9  GFRNONAA >60  ANIONGAP 8    Recent Labs  Lab 09/01/21 2041  PROT 5.6*  ALBUMIN 2.6*  AST 17  ALT 18  ALKPHOS 96  BILITOT 0.7   Hematology Recent Labs  Lab 09/01/21 2041  WBC 9.0  RBC 4.08  HGB 12.1  HCT 37.2  MCV 91.2  MCH 29.7  MCHC 32.5  RDW 13.4  PLT 241   Cardiac EnzymesNo results for input(s): "TROPONINI" in the last 168 hours. No results for input(s): "TROPIPOC" in the last 168 hours.  BNPNo results for input(s): "BNP", "PROBNP" in the last 168 hours.  DDimer No results for input(s): "DDIMER" in the last 168 hours.   Assessment/Plan: LAVELLE BERLAND is a 26 y.o. female G2P1001 [redacted]w[redacted]d admitted with right flank pain in setting of nephrolithiasis  Nephrolithiasis affecting pregnancy -Renal US report reviewed with large 1 cm right proximal ureter stone noted. Consult to Urology placed -Continue IVF hydration -IV Dilaudid PRN ordered -PO Flomax daily cHTN -Continue home PO Labetalol regimen and daily bbASA Marfan's syndrome -S/p MFM consultation -Cardiology outpatient following -Stable aortic root measurements for delivery planning Antepartum care -NST q shift -Regular diet and bowel regimen PRN -PNV daily  Kyannah Climer A Roxine Whittinghill 09/02/2021, 9:33 AM

## 2021-09-02 NOTE — Op Note (Signed)
Operative Note  Preoperative diagnosis:  1.  1 cm right UPJ stone  Postoperative diagnosis: 1.  1 cm right UPJ stone  Procedure(s): 1.  Cystoscopy with right ureteral stent placement 2.  Right retrograde pyelogram with intraoperative interpretation fluoroscopic imaging  Surgeon: Rhoderick Moody, MD  Assistants:  None  Anesthesia:  General  Complications:  None  EBL: Less than 5 mL  Specimens: 1.  None  Drains/Catheters: 1.  Right 6 French, 24 cm JJ stent without tether  Intraoperative findings:   Right retrograde pyelogram revealed a filling defect within the proximal aspects of the right ureter, consistent with the suspected stone seen on renal ultrasound.  The right renal pelvis was mildly dilated with no other filling defects.  Indication:  Carol Pugh is a 26 y.o. female with a 1 cm right UPJ stone associated with right renal colic.  She is currently at [redacted] weeks gestation.  She has been consented for the above procedures, voices understanding and wishes to proceed.  Description of procedure:  After informed consent was obtained, the patient was brought to the operating room and general LMA anesthesia was administered. The patient was then placed in the dorsolithotomy position and prepped and draped in the usual sterile fashion. A timeout was performed. A 23 French rigid cystoscope was then inserted into the urethral meatus and advanced into the bladder under direct vision. A complete bladder survey revealed no intravesical pathology.  A 5 French ureteral catheter was then inserted into the right ureteral orifice and a retrograde pyelogram was obtained, with the findings listed above.  A Glidewire was then used to intubate the lumen of the ureteral catheter and was advanced up to the right renal pelvis, under fluoroscopic guidance.  The catheter was then removed, leaving the wire in place.  A 6 French, 24 cm JJ stent was then advanced over the wire and into good  position within the right collecting system, confirming placement via fluoroscopy.  The patient's bladder was drained.  She tolerated the procedure well and was transferred to the postanesthesia in stable condition.  Plan: Plan for tentative follow-up in approximately 6 weeks after delivery of her child to schedule definitive stone treatment

## 2021-09-02 NOTE — Progress Notes (Signed)
Fetal HR 125 at 1736. Moderate variability, 15x15 accels and no decels present.

## 2021-09-03 ENCOUNTER — Other Ambulatory Visit: Payer: Self-pay

## 2021-09-03 ENCOUNTER — Encounter (HOSPITAL_COMMUNITY): Payer: Self-pay | Admitting: Urology

## 2021-09-03 MED ORDER — CYCLOBENZAPRINE HCL 10 MG PO TABS
5.0000 mg | ORAL_TABLET | Freq: Three times a day (TID) | ORAL | Status: DC | PRN
Start: 2021-09-03 — End: 2021-09-03
  Administered 2021-09-03: 5 mg via ORAL
  Filled 2021-09-03: qty 1

## 2021-09-03 MED ORDER — LABETALOL HCL 100 MG PO TABS
100.0000 mg | ORAL_TABLET | Freq: Once | ORAL | Status: AC
  Administered 2021-09-03: 100 mg via ORAL
  Filled 2021-09-03: qty 1

## 2021-09-03 MED ORDER — OXYCODONE-ACETAMINOPHEN 5-325 MG PO TABS
1.0000 | ORAL_TABLET | Freq: Once | ORAL | Status: AC
  Administered 2021-09-03: 2 via ORAL
  Filled 2021-09-03: qty 2

## 2021-09-03 MED ORDER — CYCLOBENZAPRINE HCL 5 MG PO TABS: 5.0000 mg | ORAL_TABLET | Freq: Three times a day (TID) | ORAL | 0 refills | Status: AC | PRN

## 2021-09-03 MED ORDER — TAMSULOSIN HCL 0.4 MG PO CAPS
0.4000 mg | ORAL_CAPSULE | Freq: Every day | ORAL | 0 refills | Status: AC
Start: 2021-09-04 — End: ?

## 2021-09-03 NOTE — Discharge Summary (Signed)
Physician Discharge Summary  Patient ID: Carol Pugh MRN: [id number] DOB/AGE: Jan 21, 1995 26 y.o.  Admit date: 09/01/2021 Discharge date: 09/03/2021  Admission Diagnoses:  Discharge Diagnoses:  Principal Problem:   Nephrolithiasis Active Problems:   Pregnancy complicated by nephrolithiasis in third trimester, antepartum   Discharged Condition: good  Hospital Course: 09/01/21 Patient presented with right flank pain with known history of kidney stones. She had a Renal US which revealed partially obstructing right sided 1 cm kidney stone in the proximal ureter. She was admitted to [facility] specialty care over night for pain management, requiring IV Dilaudid pushes. 09/02/21 HD#2 Patient with continued right flank pain. Urology consultation requested. Patient seen by Dr. Liliane Shi with Urology who consented her for ureteral stent placement. Procedure performed later that evening without complications, see OP note for details. Patient had reassuring fetal monitoring before and after procedure. She did have some increased contractions after procedure, but cervix found to be unchanged from admission,  1 cm dilation but long and posterior. She was continued on IVF hydration and monitored overnight. 09/03/21 HD#3 Some improvement of pain overnight but felt to be manageable and requesting discharge home.  Consults: urology  Significant Diagnostic Studies: labs: CBC    Component Value Date/Time   WBC 9.0 09/01/2021 2041   RBC 4.08 09/01/2021 2041   HGB 12.1 09/01/2021 2041   HCT 37.2 09/01/2021 2041   PLT 241 09/01/2021 2041   MCV 91.2 09/01/2021 2041   MCH 29.7 09/01/2021 2041   MCHC 32.5 09/01/2021 2041   RDW 13.4 09/01/2021 2041   LYMPHSABS 2.7 09/01/2021 2041   MONOABS 0.7 09/01/2021 2041   EOSABS 0.1 09/01/2021 2041   BASOSABS 0.0 09/01/2021 2041   CMP     Component Value Date/Time   NA 135 09/01/2021 2041   K 3.9 09/01/2021 2041   CL 105 09/01/2021 2041   CO2 22 09/01/2021 2041    GLUCOSE 83 09/01/2021 2041   BUN 8 09/01/2021 2041   CREATININE 0.67 09/01/2021 2041   CALCIUM 8.9 09/01/2021 2041   PROT 5.6 (L) 09/01/2021 2041   ALBUMIN 2.6 (L) 09/01/2021 2041   AST 17 09/01/2021 2041   ALT 18 09/01/2021 2041   ALKPHOS 96 09/01/2021 2041   BILITOT 0.7 09/01/2021 2041   GFRNONAA >60 09/01/2021 2041   GFRAA >60 04/12/2019 0810    and radiology: Ultrasound: Renal:   IMPRESSION: 1. Mild hydronephrosis on the right with a 1 cm calculus in the proximal right ureter. 2. Bilateral nephrolithiasis. 3. Gravid uterus.  Treatments: IV hydration and procedures: Right ureteral stent placement by Urology  Discharge Exam: Blood pressure 123/69, pulse 81, temperature 98.2 F (36.8 C), temperature source Oral, resp. rate 17, height 5\' 10"  (1.778 m), weight 117.3 kg, last menstrual period 12/25/2020, SpO2 95 %, unknown if currently breastfeeding. General appearance: alert and no distress Resp: clear to auscultation bilaterally Cardio: regular rate and rhythm Pelvic: cervix normal in appearance, external genitalia normal, no adnexal masses or tenderness, no cervical motion tenderness, and vagina normal without discharge Extremities: extremities normal, atraumatic, no cyanosis or edema Skin: Skin color, texture, turgor normal. No rashes or lesions  Cervix 1 cm long and posterior  Fetal Monitoring: -NST reactive baseline 120 bpm mod var +accels, -decels -Toco: irregular ctxs q 2-8 min  Disposition: Discharge disposition: 01-Home or Self Care       Discharge Instructions     Diet general   Complete by: As directed    Discharge instructions   Complete by: As  directed    Follow up in [facility] office as scheduled on Friday 09/06/21. Please call office with any regular or painful contractions, abdominal pain, pelvic cramping, vaginal bleeding, or leakage of fluids or any decreased fetal movements.   Increase activity slowly   Complete by: As directed       Allergies  as of 09/03/2021       Reactions   Bee Venom Anaphylaxis, Hives, Swelling        Medication List     STOP taking these medications    Butalbital-APAP-Caffeine 50-300-40 MG Caps       TAKE these medications    aspirin EC 81 MG tablet Take 81 mg by mouth daily. Swallow whole.   buPROPion 200 MG 12 hr tablet Commonly known as: WELLBUTRIN SR TAKE 1 TABLET BY MOUTH TWICE DAILY   cyclobenzaprine 5 MG tablet Commonly known as: FLEXERIL Take 1 tablet (5 mg total) by mouth 3 (three) times daily as needed for muscle spasms.   EPINEPHrine 0.3 mg/0.3 mL Soaj injection Commonly known as: EPI-PEN Inject into the muscle.   labetalol 200 MG tablet Commonly known as: NORMODYNE TAKE 1 TABLET BY MOUTH TWICE DAILY   multivitamin-prenatal 27-0.8 MG Tabs tablet Take 1 tablet by mouth daily at 12 noon.   ondansetron 4 MG tablet Commonly known as: ZOFRAN Take 4 mg by mouth every 8 (eight) hours as needed for nausea or vomiting.   promethazine 12.5 MG tablet Commonly known as: PHENERGAN Take 12.5 mg by mouth every 6 (six) hours as needed for nausea or vomiting.   sertraline 50 MG tablet Commonly known as: ZOLOFT Take 50 mg by mouth daily.   SUMAtriptan 100 MG tablet Commonly known as: IMITREX Take 100 mg by mouth every 2 (two) hours as needed for migraine. May repeat in 2 hours if headache persists or recurs.   tamsulosin 0.4 MG Caps capsule Commonly known as: FLOMAX Take 1 capsule (0.4 mg total) by mouth daily. Start taking on: September 04, 2021        Follow-up Information     Rene Paci, MD. Call in 6 week(s).   Specialty: Urology Why: Call to schedule follow-up appointment in 6 weeks Contact information: [address] [place] [address] [phone]                 Signed: Toy Baker 09/03/2021, 11:26 AM

## 2021-09-03 NOTE — Plan of Care (Signed)
  Problem: Education: Goal: Knowledge of disease or condition will improve Outcome: Completed/Met Goal: Knowledge of the prescribed therapeutic regimen will improve Outcome: Completed/Met Goal: Individualized Educational Video(s) Outcome: Completed/Met   Problem: Clinical Measurements: Goal: Complications related to the disease process, condition or treatment will be avoided or minimized Outcome: Completed/Met   Problem: Education: Goal: Knowledge of General Education information will improve Description: Including pain rating scale, medication(s)/side effects and non-pharmacologic comfort measures Outcome: Completed/Met   Problem: Health Behavior/Discharge Planning: Goal: Ability to manage health-related needs will improve Outcome: Completed/Met   Problem: Clinical Measurements: Goal: Ability to maintain clinical measurements within normal limits will improve Outcome: Completed/Met Goal: Will remain free from infection Outcome: Completed/Met Goal: Diagnostic test results will improve Outcome: Completed/Met Goal: Respiratory complications will improve Outcome: Completed/Met Goal: Cardiovascular complication will be avoided Outcome: Completed/Met   Problem: Activity: Goal: Risk for activity intolerance will decrease Outcome: Completed/Met   Problem: Nutrition: Goal: Adequate nutrition will be maintained Outcome: Completed/Met   Problem: Coping: Goal: Level of anxiety will decrease Outcome: Completed/Met   Problem: Elimination: Goal: Will not experience complications related to bowel motility Outcome: Completed/Met Goal: Will not experience complications related to urinary retention Outcome: Completed/Met   Problem: Pain Managment: Goal: General experience of comfort will improve Outcome: Completed/Met   Problem: Safety: Goal: Ability to remain free from injury will improve Outcome: Completed/Met   Problem: Skin Integrity: Goal: Risk for impaired skin  integrity will decrease Outcome: Completed/Met   

## 2021-09-04 NOTE — Anesthesia Postprocedure Evaluation (Signed)
Anesthesia Post Note  Patient: Carol Pugh  Procedure(s) Performed: CYSTOSCOPY WITH RETROGRADE PYELOGRAM/URETERAL STENT PLACEMENT (Right)     Patient location during evaluation: PACU Anesthesia Type: General Level of consciousness: awake and alert Pain management: pain level controlled Vital Signs Assessment: post-procedure vital signs reviewed and stable Respiratory status: spontaneous breathing, nonlabored ventilation, respiratory function stable and patient connected to nasal cannula oxygen Cardiovascular status: blood pressure returned to baseline and stable Postop Assessment: no apparent nausea or vomiting Anesthetic complications: no   No notable events documented.  Last Vitals:  Vitals:   09/03/21 0801 09/03/21 1003  BP: (!) 106/59 123/69  Pulse: 88 81  Resp: 17   Temp: 36.8 C   SpO2: 95%     Last Pain:  Vitals:   09/03/21 1140  TempSrc:   PainSc: 7    Pain Goal: Patients Stated Pain Goal: 3 (09/02/21 1453)                 Ruthvik Barnaby S

## 2021-09-06 LAB — OB RESULTS CONSOLE GBS: GBS: NEGATIVE

## 2021-09-11 ENCOUNTER — Telehealth (HOSPITAL_COMMUNITY): Payer: Self-pay | Admitting: *Deleted

## 2021-09-11 ENCOUNTER — Encounter (HOSPITAL_COMMUNITY): Payer: Self-pay | Admitting: *Deleted

## 2021-09-11 NOTE — Telephone Encounter (Signed)
Preadmission screen  

## 2021-09-12 ENCOUNTER — Inpatient Hospital Stay (HOSPITAL_COMMUNITY)
Admission: AD | Admit: 2021-09-12 | Discharge: 2021-09-20 | DRG: 806 | Disposition: A | Discharge status: Home / Self Care | Payer: No Typology Code available for payment source | Attending: Obstetrics and Gynecology | Admitting: Obstetrics and Gynecology

## 2021-09-12 ENCOUNTER — Encounter (HOSPITAL_COMMUNITY): Payer: Self-pay | Admitting: Obstetrics and Gynecology

## 2021-09-12 DIAGNOSIS — Z3490 Encounter for supervision of normal pregnancy, unspecified, unspecified trimester: Secondary | ICD-10-CM | POA: Diagnosis present

## 2021-09-12 DIAGNOSIS — N202 Calculus of kidney with calculus of ureter: Secondary | ICD-10-CM | POA: Diagnosis present

## 2021-09-12 DIAGNOSIS — Z87442 Personal history of urinary calculi: Secondary | ICD-10-CM

## 2021-09-12 DIAGNOSIS — O26833 Pregnancy related renal disease, third trimester: Principal | ICD-10-CM | POA: Diagnosis present

## 2021-09-12 DIAGNOSIS — Z8279 Family history of other congenital malformations, deformations and chromosomal abnormalities: Secondary | ICD-10-CM

## 2021-09-12 DIAGNOSIS — Q87410 Marfan's syndrome with aortic dilation: Secondary | ICD-10-CM

## 2021-09-12 DIAGNOSIS — O99892 Other specified diseases and conditions complicating childbirth: Secondary | ICD-10-CM | POA: Diagnosis present

## 2021-09-12 DIAGNOSIS — Z87891 Personal history of nicotine dependence: Secondary | ICD-10-CM

## 2021-09-12 DIAGNOSIS — F419 Anxiety disorder, unspecified: Secondary | ICD-10-CM | POA: Diagnosis present

## 2021-09-12 DIAGNOSIS — N200 Calculus of kidney: Secondary | ICD-10-CM

## 2021-09-12 DIAGNOSIS — O10919 Unspecified pre-existing hypertension complicating pregnancy, unspecified trimester: Secondary | ICD-10-CM | POA: Diagnosis present

## 2021-09-12 DIAGNOSIS — O99344 Other mental disorders complicating childbirth: Secondary | ICD-10-CM | POA: Diagnosis present

## 2021-09-12 DIAGNOSIS — O1002 Pre-existing essential hypertension complicating childbirth: Secondary | ICD-10-CM | POA: Diagnosis present

## 2021-09-12 DIAGNOSIS — Q8740 Marfan's syndrome, unspecified: Secondary | ICD-10-CM

## 2021-09-12 DIAGNOSIS — Z3A36 36 weeks gestation of pregnancy: Secondary | ICD-10-CM

## 2021-09-12 DIAGNOSIS — F32A Depression, unspecified: Secondary | ICD-10-CM | POA: Diagnosis present

## 2021-09-12 DIAGNOSIS — F339 Major depressive disorder, recurrent, unspecified: Secondary | ICD-10-CM | POA: Diagnosis present

## 2021-09-12 LAB — CBC
HCT: 35.8 % — ABNORMAL LOW (ref 36.0–46.0)
Hemoglobin: 12.1 g/dL (ref 12.0–15.0)
MCH: 29.6 pg (ref 26.0–34.0)
MCHC: 33.8 g/dL (ref 30.0–36.0)
MCV: 87.5 fL (ref 80.0–100.0)
Platelets: 252 10*3/uL (ref 150–400)
RBC: 4.09 MIL/uL (ref 3.87–5.11)
RDW: 13.7 % (ref 11.5–15.5)
WBC: 8.9 10*3/uL (ref 4.0–10.5)
nRBC: 0 % (ref 0.0–0.2)

## 2021-09-12 LAB — TYPE AND SCREEN
ABO/RH(D): A POS
Antibody Screen: NEGATIVE

## 2021-09-12 MED ORDER — ONDANSETRON HCL 4 MG/2ML IJ SOLN
4.0000 mg | Freq: Four times a day (QID) | INTRAMUSCULAR | Status: DC | PRN
Start: 2021-09-12 — End: 2021-09-18
  Administered 2021-09-13 – 2021-09-17 (×5): 4 mg via INTRAVENOUS
  Filled 2021-09-12 (×5): qty 2

## 2021-09-12 MED ORDER — DIPHENHYDRAMINE HCL 50 MG/ML IJ SOLN: 12.5000 mg | Freq: Four times a day (QID) | INTRAMUSCULAR | Status: DC | PRN

## 2021-09-12 MED ORDER — SERTRALINE HCL 50 MG PO TABS
50.0000 mg | ORAL_TABLET | Freq: Every day | ORAL | Status: DC
  Administered 2021-09-13 – 2021-09-17 (×5): 50 mg via ORAL
  Filled 2021-09-12 (×6): qty 1

## 2021-09-12 MED ORDER — LABETALOL HCL 200 MG PO TABS
200.0000 mg | ORAL_TABLET | Freq: Two times a day (BID) | ORAL | Status: DC
  Administered 2021-09-12: 200 mg via ORAL
  Filled 2021-09-12: qty 1

## 2021-09-12 MED ORDER — ACETAMINOPHEN 325 MG PO TABS: 650.0000 mg | ORAL_TABLET | ORAL | Status: DC | PRN

## 2021-09-12 MED ORDER — HYDROMORPHONE 1 MG/ML IV SOLN
INTRAVENOUS | Status: DC
  Administered 2021-09-12 – 2021-09-13 (×2): 30 mg via INTRAVENOUS
  Administered 2021-09-13: 2.6 mg via INTRAVENOUS
  Administered 2021-09-13: 1.2 mg via INTRAVENOUS
  Administered 2021-09-13: 0.9 mg via INTRAVENOUS
  Administered 2021-09-13: 1.2 mg via INTRAVENOUS
  Administered 2021-09-13 (×2): 0.9 mg via INTRAVENOUS
  Administered 2021-09-14: 0.6 mg via INTRAVENOUS
  Administered 2021-09-14: 30 mg via INTRAVENOUS
  Administered 2021-09-14: 0.9 mg via INTRAVENOUS
  Administered 2021-09-15: 30 mg via INTRAVENOUS
  Administered 2021-09-15: 60 mg via INTRAVENOUS
  Administered 2021-09-15: 30 mg via INTRAVENOUS
  Administered 2021-09-16: 0.6 mg via INTRAVENOUS
  Administered 2021-09-16: 0.9 mg via INTRAVENOUS
  Administered 2021-09-16: 0.3 mg via INTRAVENOUS
  Administered 2021-09-16: 0.9 mg via INTRAVENOUS
  Administered 2021-09-16: 30 mg via INTRAVENOUS
  Administered 2021-09-16: 0.3 mg via INTRAVENOUS
  Administered 2021-09-17: 0.9 mg via INTRAVENOUS
  Administered 2021-09-17: 0.6 mg via INTRAVENOUS
  Administered 2021-09-17: 0.9 mg via INTRAVENOUS
  Administered 2021-09-17 (×2): 1.2 mg via INTRAVENOUS
  Administered 2021-09-17: 30 mg via INTRAVENOUS
  Administered 2021-09-17: 1.2 mg via INTRAVENOUS
  Administered 2021-09-18: 1.5 mg via INTRAVENOUS
  Administered 2021-09-18: 0.9 mg via INTRAVENOUS
  Filled 2021-09-12 (×16): qty 30

## 2021-09-12 MED ORDER — ASPIRIN 81 MG PO CHEW
81.0000 mg | CHEWABLE_TABLET | Freq: Every day | ORAL | Status: DC
  Administered 2021-09-13 – 2021-09-17 (×5): 81 mg via ORAL
  Filled 2021-09-12 (×5): qty 1

## 2021-09-12 MED ORDER — DIPHENHYDRAMINE HCL 12.5 MG/5ML PO ELIX: 12.5000 mg | ORAL_SOLUTION | Freq: Four times a day (QID) | ORAL | Status: DC | PRN

## 2021-09-12 MED ORDER — PRENATAL MULTIVITAMIN CH
1.0000 | ORAL_TABLET | Freq: Every day | ORAL | Status: DC
  Administered 2021-09-13 – 2021-09-17 (×5): 1 via ORAL
  Filled 2021-09-12 (×5): qty 1

## 2021-09-12 MED ORDER — TAMSULOSIN HCL 0.4 MG PO CAPS
0.4000 mg | ORAL_CAPSULE | Freq: Every day | ORAL | Status: DC
  Administered 2021-09-13 – 2021-09-17 (×5): 0.4 mg via ORAL
  Filled 2021-09-12 (×6): qty 1

## 2021-09-12 MED ORDER — SODIUM CHLORIDE 0.9% FLUSH: 9.0000 mL | INTRAVENOUS | Status: DC | PRN

## 2021-09-12 MED ORDER — DOCUSATE SODIUM 100 MG PO CAPS
100.0000 mg | ORAL_CAPSULE | Freq: Every day | ORAL | Status: DC
  Administered 2021-09-12 – 2021-09-14 (×3): 100 mg via ORAL
  Filled 2021-09-12 (×4): qty 1

## 2021-09-12 MED ORDER — ZOLPIDEM TARTRATE 5 MG PO TABS: 5.0000 mg | ORAL_TABLET | Freq: Every evening | ORAL | Status: DC | PRN

## 2021-09-12 MED ORDER — BUPROPION HCL ER (SR) 100 MG PO TB12
200.0000 mg | ORAL_TABLET | Freq: Two times a day (BID) | ORAL | Status: DC
  Administered 2021-09-12 – 2021-09-17 (×11): 200 mg via ORAL
  Filled 2021-09-12 (×14): qty 2

## 2021-09-12 MED ORDER — CALCIUM CARBONATE ANTACID 500 MG PO CHEW
2.0000 | CHEWABLE_TABLET | ORAL | Status: DC | PRN
Start: 2021-09-12 — End: 2021-09-18

## 2021-09-12 MED ORDER — LACTATED RINGERS IV SOLN: INTRAVENOUS | Status: DC

## 2021-09-12 MED ORDER — NALOXONE HCL 0.4 MG/ML IJ SOLN: 0.4000 mg | INTRAMUSCULAR | Status: DC | PRN

## 2021-09-12 NOTE — H&P (Addendum)
Carol Pugh is a 26 y.o. female G2P1001 [redacted]w[redacted]d presenting for right flank pain. Patient seen in office today with inc pain. Some radiating of pain anteriorly to the front of her abdomen and some in the low back. Patient has known long term history of kidney stones and was sent for renal study. Renal US significant for bilateral stones with largest measured 1 cm in the right proximal ureter with mild right hydronephrosis noted. Stent placement done with little relief. Failed PO dilaudid. Urology will not intervene further at this time.Given size of stones and poor control of patient pain, she was admitted for observation.  Pregnancy complicated by cHTN, anxiety/depression, and recent diagnosis of Marfan's syndrome. HTN has been well controlled on PO Labetalol 100 mg TID. Has also been on risk reducing ASA 81 mg daily. Anxiety/Depression well controlled on Zoloft 50 mg daily. Patient had genetic testing for Marfan's syndrome prior to the pregnancy after father was diagnosed and passed away age 45Y with aortic root aneurysm. She has been seen by genetic counseling, MFM, and Cardiology. Patient had normal fetal anatomy scan with MFM and susequently followed with serial growth scans in our office. Most recent growth this week AGA and nl afi. Patient seen at [facility] Cardiology with Dr. Nicolette Bang for serial aortic root measurements, last done 6/9 stable at 37-38 mm. Patient states she was scheduled to see Urology through [facility] next week but has not had established care with them. Previously seen through [facility]rologists in the past. Patient estimates this is the 30th kidney stone flare she has had, with multiple lithotripsy procedures done in the past, most recently done in 2019. Usually tries to self manage and has prescriptions for Flomax and Oxycodone to use PRN at home. Last pregnancy was induced at 37 weeks due to ongoing kidney stone pain and elevations in BP.   Prenatal care at [facility] with Dr. Billy Coast as  primary.  OB History     Gravida  2   Para  1   Term  1   Preterm  0   AB  0   Living  1      SAB  0   IAB  0   Ectopic  0   Multiple  0   Live Births  1          Past Medical History:  Diagnosis Date   Anxiety    Depression    Distal renal tubular acidosis    Family history of Marfan syndrome    Headache    Heart murmur    History of kidney stones    Hypertension    Kidney stones    Marfan syndrome    Migraine    PCOS (polycystic ovarian syndrome)    Past Surgical History:  Procedure Laterality Date   CYSTOSCOPY W/ URETERAL STENT PLACEMENT Right 09/02/2021   Procedure: CYSTOSCOPY WITH RETROGRADE PYELOGRAM/URETERAL STENT PLACEMENT;  Surgeon: Rene Paci, MD;  Location: [facility] OR;  Service: Urology;  Laterality: Right;   EXTRACORPOREAL SHOCK WAVE LITHOTRIPSY Left 06/11/2017   Procedure: LEFT EXTRACORPOREAL SHOCK WAVE LITHOTRIPSY (ESWL);  Surgeon: Heloise Purpura, MD;  Location: [facility] ORS;  Service: Urology;  Laterality: Left;   FRACTURE SURGERY     KNEE ARTHROSCOPY WITH ANTERIOR CRUCIATE LIGAMENT (ACL) REPAIR WITH HAMSTRING GRAFT Right 10/19/2017   Procedure: RIGHT KNEE ARTHROSCOPY WITH ANTERIOR CRUCIATE LIGAMENT (ACL) REPAIR WITH AUTOGRAFT HAMSTRING;  Surgeon: Bjorn Pippin, MD;  Location: [facility];  Service: Orthopedics;  Laterality:  Right;   MEDIAL PATELLOFEMORAL LIGAMENT REPAIR Right 10/19/2017   Procedure: MEDIAL PATELLA FEMORAL LIGAMENT RECONSTRUCTION RIGHT KNEE;  Surgeon: Bjorn Pippin, MD;  Location: [facility];  Service: Orthopedics;  Laterality: Right;   urinary stent placements     Family History: family history includes COPD in her maternal grandmother; Heart attack in her maternal grandmother; Marfan syndrome in her father; Ovarian cancer in her paternal grandmother; Stroke in her maternal grandmother. Social History:  reports that she has quit smoking. Her smoking use included cigarettes. She has a 1.25  pack-year smoking history. She has never used smokeless tobacco. She reports that she does not currently use alcohol. She reports that she does not use drugs.     Maternal Diabetes: No Genetic Screening: Normal Maternal Ultrasounds/Referrals: Normal Fetal Ultrasounds or other Referrals:  Referred to Materal Fetal Medicine  Maternal Substance Abuse:  No Significant Maternal Medications:  Meds include: Other:  Significant Maternal Lab Results:  None Other Comments:  None  Review of Systems  Constitutional: Negative.   Gastrointestinal:  Positive for nausea.  Genitourinary:  Positive for flank pain.  All other systems reviewed and are negative.  Per HPI Maternal Exam:  Uterine Assessment: Contraction strength is mild.  Contraction frequency is irregular.  Abdomen: Patient reports no abdominal tenderness. Fundal height is 34 week FH.   Fetal presentation: vertex Introitus: Normal vulva. Normal vagina.  Ferning test: not done.  Nitrazine test: not done. Amniotic fluid character: not assessed. Pelvis: adequate for delivery.   Cervix: Cervix evaluated by digital exam.     Fetal Exam Fetal Monitor Review: Baseline rate: 150.  Variability: moderate (6-25 bpm).   Pattern: accelerations present and no decelerations.   Fetal State Assessment: Category I - tracings are normal.   Physical Exam Vitals reviewed.  Constitutional:      Appearance: Normal appearance. She is obese.  HENT:     Head: Normocephalic and atraumatic.  Cardiovascular:     Rate and Rhythm: Normal rate and regular rhythm.  Pulmonary:     Effort: Pulmonary effort is normal.     Breath sounds: Normal breath sounds.  Abdominal:     General: Bowel sounds are normal.     Palpations: Abdomen is soft.     Tenderness: There is no right CVA tenderness or left CVA tenderness.  Genitourinary:    General: Normal vulva.     Uterus: Enlarged. Not tender.   Musculoskeletal:        General: Normal range of motion.      Cervical back: Normal range of motion and neck supple.  Skin:    General: Skin is warm and dry.  Neurological:     General: No focal deficit present.     Mental Status: She is alert and oriented to person, place, and time.  Psychiatric:        Mood and Affect: Mood normal.        Behavior: Behavior normal.       Blood pressure 124/70, pulse 99, temperature 98.1 F (36.7 C), temperature source Oral, resp. rate 18, weight 116.8 kg, last menstrual period 12/25/2020, SpO2 100 %, currently breastfeeding.  Prenatal labs: ABO, Rh:  --/--/A POS (08/31 1723) Antibody: NEG (08/31 1723)  Chemistry No results for input(s): "NA", "K", "CL", "CO2", "GLUCOSE", "BUN", "CREATININE", "CALCIUM", "GFRNONAA", "GFRAA", "ANIONGAP" in the last 168 hours.   No results for input(s): "PROT", "ALBUMIN", "AST", "ALT", "ALKPHOS", "BILITOT" in the last 168 hours.  Hematology Recent Labs  Lab  09/12/21 1723  WBC 8.9  RBC 4.09  HGB 12.1  HCT 35.8*  MCV 87.5  MCH 29.6  MCHC 33.8  RDW 13.7  PLT 252    Cardiac EnzymesNo results for input(s): "TROPONINI" in the last 168 hours. No results for input(s): "TROPIPOC" in the last 168 hours.  BNPNo results for input(s): "BNP", "PROBNP" in the last 168 hours.  DDimer No results for input(s): "DDIMER" in the last 168 hours.   Assessment/Plan: Carol Pugh is a 26 y.o. female G2P1001 [redacted]w[redacted]d admitted with right flank pain in setting of nephrolithiasis   Nephrolithiasis affecting pregnancy -Renal US report reviewed with large 1 cm right proximal ureter stone noted. Urology placed stent without relief -Continue IVF hydration -IV Dilaudid PCA ordered  CHTN -Continue home PO Labetalol regimen and daily bbASA Marfan's syndrome -S/p MFM consultation -Cardiology outpatient following at [facility] -Stable aortic root measurements for delivery vaginal at 37w per mfm Antepartum care -NST qd -Regular diet and bowel regimen PRN -PNV daily IOL 37w  60 minutes face  to face ,review of records and consultation Carol Pugh 09/12/2021, 7:05 PM

## 2021-09-13 ENCOUNTER — Encounter (HOSPITAL_COMMUNITY): Payer: Self-pay | Admitting: Obstetrics and Gynecology

## 2021-09-13 ENCOUNTER — Other Ambulatory Visit: Payer: Self-pay | Admitting: Obstetrics and Gynecology

## 2021-09-13 ENCOUNTER — Other Ambulatory Visit: Payer: Self-pay

## 2021-09-13 DIAGNOSIS — F419 Anxiety disorder, unspecified: Secondary | ICD-10-CM | POA: Diagnosis present

## 2021-09-13 DIAGNOSIS — Z8279 Family history of other congenital malformations, deformations and chromosomal abnormalities: Secondary | ICD-10-CM | POA: Diagnosis not present

## 2021-09-13 DIAGNOSIS — O1002 Pre-existing essential hypertension complicating childbirth: Secondary | ICD-10-CM | POA: Diagnosis present

## 2021-09-13 DIAGNOSIS — O99344 Other mental disorders complicating childbirth: Secondary | ICD-10-CM | POA: Diagnosis present

## 2021-09-13 DIAGNOSIS — O26833 Pregnancy related renal disease, third trimester: Secondary | ICD-10-CM | POA: Diagnosis present

## 2021-09-13 DIAGNOSIS — N202 Calculus of kidney with calculus of ureter: Secondary | ICD-10-CM | POA: Diagnosis present

## 2021-09-13 DIAGNOSIS — F32A Depression, unspecified: Secondary | ICD-10-CM | POA: Diagnosis present

## 2021-09-13 DIAGNOSIS — Q87410 Marfan's syndrome with aortic dilation: Secondary | ICD-10-CM | POA: Diagnosis not present

## 2021-09-13 DIAGNOSIS — Z3A36 36 weeks gestation of pregnancy: Secondary | ICD-10-CM | POA: Diagnosis not present

## 2021-09-13 DIAGNOSIS — O99892 Other specified diseases and conditions complicating childbirth: Secondary | ICD-10-CM | POA: Diagnosis present

## 2021-09-13 DIAGNOSIS — Z87442 Personal history of urinary calculi: Secondary | ICD-10-CM | POA: Diagnosis not present

## 2021-09-13 DIAGNOSIS — Z87891 Personal history of nicotine dependence: Secondary | ICD-10-CM | POA: Diagnosis not present

## 2021-09-13 DIAGNOSIS — R109 Unspecified abdominal pain: Secondary | ICD-10-CM | POA: Diagnosis present

## 2021-09-13 MED ORDER — LABETALOL HCL 200 MG PO TABS
200.0000 mg | ORAL_TABLET | Freq: Every day | ORAL | Status: DC
  Administered 2021-09-13 – 2021-09-17 (×4): 200 mg via ORAL
  Filled 2021-09-13 (×5): qty 1

## 2021-09-13 MED ORDER — LABETALOL HCL 100 MG PO TABS
100.0000 mg | ORAL_TABLET | Freq: Every day | ORAL | Status: DC
  Administered 2021-09-13 – 2021-09-17 (×5): 100 mg via ORAL
  Filled 2021-09-13 (×5): qty 1

## 2021-09-13 NOTE — Progress Notes (Signed)
Hospital day #2 pain control for bilateral renal lithiasis G2, P1 at 36 weeks and 2 days  Subjective: Patient notes good fetal movement, intermittent contractions, no vaginal bleeding, no leakage of fluid.  Patient notes continued bilateral flank and lower abdominal pain right greater than left.  Controlled with PCA.  Patient states pushing button frequently but not maxing out her dose.  Patient notes hematuria.  Patient tolerating regular p.o.  Patient notes elevated blood pressure last night as it took a while to get her labetalol.  No headache.  Objective: Vitals:   09/13/21 0941 09/13/21 1029  BP: 120/73   Pulse: (!) 111   Resp: 19 16  Temp: 98 F (36.7 C)   SpO2:  100%   General: Laying in bed laying on right side, no distress Abdomen nontender no fundal tenderness, no right upper quadrant pain Cardiovascular: Regular rate and rhythm Pulmonary: Clear to auscultation bilaterally GU: No bleeding Lower extremity: Nontender, no edema, SCDs in place  8/31 pm NST: Toco: None FH 130s, positive accelerations, no decelerations, 10 beat variability  9/1: A.m. NST: Toco none, FH: Baseline 120s, 5-8 beat variability, rare accelerations in the 130s, no decelerations  Assessment and plan: 26 year old G2, P1 at 36 weeks and 2 days admitted with flank pain in the setting of known nephrolithiasis and right ureteral stent.  Patient under the care of urology who are not planning lithotripsy for removal of the stent to postpartum.  Previous consult with MFM who agrees to delivery at 37 weeks.  Patient with Marfan syndrome and known aortic root dilatation though stable and patient has been cleared by cardiology for attempted vaginal delivery.  Chronic hypertension on low-dose oral labetalol and baby aspirin.  No evidence of preeclampsia.  Patient in-house until delivery for pain control which is adequate with Dilaudid PCA.  Fetal testing not concerning though nonreactive NST this a.m., likely due to  Dilaudid use.  NST was reactive last night we will continue to follow with every shift NST.  Good fetal movement remains reassuring.  Lendon Colonel 09/13/2021 11:27 AM

## 2021-09-14 NOTE — Progress Notes (Signed)
[name] 26 y.o. G2P1001 at [redacted]w[redacted]d HD#3 admitted with flank pain in setting of known nephrolithiasis   S: Patient doing well this morning, was able to sleep comfortably overnight. Dilaudid PCA keeping pain manageable. Denies any noticeable contractions or lower abdominal pain. No VB or LOF. Endorses good FM.   O: Vitals:   09/14/21 0607 09/14/21 0714 09/14/21 1000 09/14/21 1132  BP:  (!) 90/51  (!) 101/59  Pulse:  83  92  Resp: (!) 22 16 16 20   Temp:  97.8 F (36.6 C)  98.4 F (36.9 C)  TempSrc:  Oral  Oral  SpO2: 96% 97% 99% 99%  Weight:      Height:       Physical Exam: -General: AAO, NAD -Cardiovascular: RRR -Respiratory: CTABL -Abdomen: gravid uterus non-tender -Extremities: trace b/l LE edema  Fetal Monitoring:  9/1 PM NST reactive baseline 130 bpm mod var +accels, -decels. Rare intermittent ctx noted on toco 9/2 AM NST reactive baseline 130 bpm mod var +accels, -decels. Rare ctx noted on toco   Carol/P: Carol Pugh 26 y.o. G2P1001 at [redacted]w[redacted]d HD#3 admitted with flank pain and bilateral nephrolithiasis, now stable  Nephrolithiasis affecting pregnancy -s/p stent placement with urology 8/21 -Dilaudid PCA pain control cHTN -Continue home PO Labetalol regimen and daily bbASA Marfan's syndrome -S/p MFM consultation -Cardiology outpatient following -Stable aortic root measurements cleared for vaginal delivery Antepartum care -NST q shift -Regular diet and bowel regimen PRN -PNV daily   Inpatient pain control until delivery, per MFM plan for 37 weeks, IOL posted   Carol Pugh Carol Pugh 09/14/21 12:30 PM

## 2021-09-15 MED ORDER — SODIUM CHLORIDE 0.9 % IV SOLN
12.5000 mg | Freq: Four times a day (QID) | INTRAVENOUS | Status: DC | PRN
  Administered 2021-09-15 – 2021-09-17 (×4): 12.5 mg via INTRAVENOUS
  Filled 2021-09-15 (×3): qty 12.5
  Filled 2021-09-15: qty 0.5

## 2021-09-15 MED ORDER — DOCUSATE SODIUM 100 MG PO CAPS: 100.0000 mg | ORAL_CAPSULE | Freq: Two times a day (BID) | ORAL | Status: DC | PRN

## 2021-09-15 MED ORDER — DOCUSATE SODIUM 100 MG PO CAPS
100.0000 mg | ORAL_CAPSULE | Freq: Two times a day (BID) | ORAL | Status: DC
  Administered 2021-09-15 – 2021-09-17 (×6): 100 mg via ORAL
  Filled 2021-09-15 (×5): qty 1

## 2021-09-15 NOTE — Progress Notes (Signed)
Carol Pugh 26 y.o. G2P1001 at [redacted]w[redacted]d HD#4 admitted with flank pain and known nephrolithiasis   S: Patient doing well this morning, was able to sleep comfortably overnight. Dilaudid PCA keeping pain manageable. Denies any noticeable contractions or lower abdominal pain. No VB or LOF. Endorses good FM.   O: Vitals:   09/15/21 0545 09/15/21 0700 09/15/21 0958 09/15/21 1140  BP:  112/60  (!) 98/49  Pulse:  97  93  Resp: 16   16  Temp:  97.8 F (36.6 C)  97.8 F (36.6 C)  TempSrc:  Oral  Oral  SpO2: 98%  98%   Weight:      Height:        Physical Exam: -General: AAO, NAD -Cardiovascular: RRR -Respiratory: CTABL -Abdomen: gravid uterus non-tender -Extremities: trace b/l LE edema   Fetal Monitoring:  9/3 AM NST reactive baseline 130 bpm mod var +accels, -decels. Rare ctx noted on toco   A/P: Carol Pugh 26 y.o. G2P1001 at [redacted]w[redacted]d HD#4 admitted with flank pain and nephrolithiasis, now clinically stable with pain well managed    Nephrolithiasis affecting pregnancy -s/p stent placement with urology 8/21 -Dilaudid PCA pain control cHTN -Continue home PO Labetalol regimen and daily bbASA Marfan's syndrome -S/p MFM consultation -Cardiology outpatient following -Stable aortic root measurements cleared for vaginal delivery Antepartum care -NST daily -Regular diet and bowel regimen PRN -PNV daily   Inpatient pain control until delivery, per MFM plan for 37 weeks, IOL posted  Carol Pugh 09/15/21 12:35 PM

## 2021-09-16 MED ORDER — POLYETHYLENE GLYCOL 3350 17 G PO PACK
17.0000 g | PACK | Freq: Every day | ORAL | Status: DC | PRN
Start: 2021-09-16 — End: 2021-09-18
  Administered 2021-09-16 – 2021-09-17 (×2): 17 g via ORAL
  Filled 2021-09-16 (×2): qty 1

## 2021-09-16 NOTE — Progress Notes (Signed)
HD#5 admitted with flank pain and known nephrolithiasis   Some ctx yesterday, resolved No lof/ctx this am; no vb; +FM Pain controlled with dilaudid pca, keeps pain under a 5/10, usually a more dull/constant right low back/side pain but then will flare; no new c/o  Patient Vitals for the past 24 hrs:  BP Temp Temp src Pulse Resp SpO2  09/16/21 0929 -- -- -- -- 16 99 %  09/16/21 0848 110/75 98 F (36.7 C) Oral 96 17 --  09/16/21 0605 -- -- -- -- 14 97 %  09/16/21 0254 -- -- -- -- 16 97 %  09/15/21 2133 -- -- -- -- 18 98 %  09/15/21 2013 (!) 125/59 98 F (36.7 C) Oral (!) 105 16 97 %  09/15/21 1808 -- -- -- -- 17 100 %  09/15/21 1518 105/61 97.6 F (36.4 C) Oral 91 16 98 %  09/15/21 1439 -- -- -- -- 18 --  09/15/21 1140 (!) 98/49 97.8 F (36.6 C) Oral 93 16 --   A&ox3 Nml respirations Abd: soft, nt, nd, gravid LE: no edema, nt bilat   NST 9/3 1310: 130s, nml variability, +accels, no decels TOCO: irreg ctx  NST 9/4 1108 FHT: 120s, nml variability, +accels, no decels TOCO: irreg ctx, pt not feeling  A/P: Carol Pugh 26 y.o. G2P1001 at [redacted]w[redacted]d HD#5 admitted with flank pain and nephrolithiasis, now clinically stable with pain well managed     Nephrolithiasis affecting pregnancy -s/p stent placement with urology 8/21 -Dilaudid PCA pain control cHTN -Continue home PO Labetalol regimen (100po q am, 200 q hs)and daily bbASA, watch for hypotension Marfan's syndrome -S/p MFM consultation -Cardiology outpatient following -Stable aortic root measurements cleared for vaginal delivery Antepartum care -NST daily -Regular diet and bowel regimen PRN -PNV daily   Inpatient pain control until delivery, per MFM plan for 37 weeks, 9/6; IOL posted  in chart review, exam, discussion with nursing

## 2021-09-17 NOTE — Progress Notes (Signed)
HD# 6  admitted with flank pain and known nephrolithiasis   Passed one kidney stone today. Does not think its the big one, not much relief from  right low back / hip radiating pain, wants to continue Dilaudid PCA  No lof/ctx. No vb; +FM  BP 108/61   Pulse 96   Temp 98 F (36.7 C) (Oral)   Resp 11   Ht 5\' 10"  (1.778 m)   Wt 116.8 kg   LMP 12/25/2020   SpO2 100%   Breastfeeding Yes   BMI 36.93 kg/m   Patient Vitals for the past 24 hrs:  BP Temp Temp src Pulse Resp SpO2  09/17/21 0924 -- -- -- -- 11 100 %  09/17/21 0805 108/61 -- -- 96 -- 100 %  09/17/21 0730 -- -- -- -- -- 95 %  09/17/21 0725 -- -- -- -- -- 95 %  09/17/21 0720 -- -- -- -- -- 95 %  09/17/21 0715 -- -- -- -- -- 96 %  09/17/21 0710 -- -- -- -- -- 96 %  09/17/21 0705 -- -- -- -- -- 97 %  09/17/21 0700 -- -- -- -- -- 98 %  09/17/21 0559 -- -- -- -- 14 100 %  09/17/21 0546 130/77 -- -- (!) 105 16 97 %  09/17/21 0158 -- -- -- -- 16 97 %  09/16/21 2206 -- -- -- -- 16 100 %  09/16/21 2002 (!) 107/57 98 F (36.7 C) Oral 96 16 99 %  09/16/21 1822 -- -- -- -- 19 100 %  09/16/21 1713 116/68 97.9 F (36.6 C) Oral 88 19 --  09/16/21 1410 -- -- -- -- 18 --  09/16/21 1219 115/61 98.6 F (37 C) Oral 88 15 99 %    A&ox3 Nml respirations Abd: soft, nt, nd, gravid Back - no CVA tenderness  LE: no edema, nt bilat, no cords/ tenderness    NST  9/4 at 5.30 pm  - 130s mod variability + accels no decels (10x10 accels)  9/5 at 9.30 am  -  140s moderate variability, +accels, no decels (accels are 10x10) likely narcotic effect  TOCO: none noted or reported   A/P: 11/4 26 y.o. G2P1001 at 36w 6d HD#6 admitted with flank pain and nephrolithiasis, now clinically stable with pain well managed    Nephrolithiasis affecting pregnancy -s/p stent placement with urology 8/21 -Dilaudid PCA pain control cHTN -Continue home PO Labetalol regimen (100po q am, 200 q hs)and daily bbASA, watch for hypotension Marfan's  syndrome -S/p MFM consultation -Cardiology outpatient following -Stable aortic root measurements cleared for vaginal delivery Antepartum care -NST daily -Regular diet and bowel regimen PRN -PNV daily   Plan per MFM IOL at 37 weeks on 9/6.   11/6 in chart review, exam, discussion with nursing  - MD

## 2021-09-18 ENCOUNTER — Encounter (HOSPITAL_COMMUNITY): Payer: Self-pay | Admitting: Obstetrics and Gynecology

## 2021-09-18 ENCOUNTER — Inpatient Hospital Stay (HOSPITAL_COMMUNITY)
Admission: AD | Admit: 2021-09-18 | Payer: No Typology Code available for payment source | Source: Home / Self Care | Admitting: Obstetrics and Gynecology

## 2021-09-18 ENCOUNTER — Inpatient Hospital Stay (HOSPITAL_COMMUNITY): Payer: No Typology Code available for payment source | Admitting: Anesthesiology

## 2021-09-18 ENCOUNTER — Other Ambulatory Visit: Payer: Self-pay

## 2021-09-18 ENCOUNTER — Inpatient Hospital Stay (HOSPITAL_COMMUNITY): Payer: No Typology Code available for payment source | Attending: Obstetrics and Gynecology

## 2021-09-18 DIAGNOSIS — Z3490 Encounter for supervision of normal pregnancy, unspecified, unspecified trimester: Secondary | ICD-10-CM | POA: Diagnosis present

## 2021-09-18 LAB — CBC
HCT: 36.9 % (ref 36.0–46.0)
Hemoglobin: 12.1 g/dL (ref 12.0–15.0)
MCH: 29.4 pg (ref 26.0–34.0)
MCHC: 32.8 g/dL (ref 30.0–36.0)
MCV: 89.6 fL (ref 80.0–100.0)
Platelets: 240 10*3/uL (ref 150–400)
RBC: 4.12 MIL/uL (ref 3.87–5.11)
RDW: 14.2 % (ref 11.5–15.5)
WBC: 7.7 10*3/uL (ref 4.0–10.5)
nRBC: 0 % (ref 0.0–0.2)

## 2021-09-18 LAB — RPR: RPR Ser Ql: NONREACTIVE

## 2021-09-18 LAB — COMPREHENSIVE METABOLIC PANEL
ALT: 17 U/L (ref 0–44)
AST: 19 U/L (ref 15–41)
Albumin: 2.3 g/dL — ABNORMAL LOW (ref 3.5–5.0)
Alkaline Phosphatase: 97 U/L (ref 38–126)
Anion gap: 9 (ref 5–15)
BUN: <5 mg/dL — ABNORMAL LOW (ref 6–20)
CO2: 20 mmol/L — ABNORMAL LOW (ref 22–32)
Calcium: 8.8 mg/dL — ABNORMAL LOW (ref 8.9–10.3)
Chloride: 109 mmol/L (ref 98–111)
Creatinine, Ser: 0.74 mg/dL (ref 0.44–1.00)
GFR, Estimated: >60 mL/min (ref >60)
Glucose, Bld: 101 mg/dL — ABNORMAL HIGH (ref 70–99)
Potassium: 4.2 mmol/L (ref 3.5–5.1)
Sodium: 138 mmol/L (ref 135–145)
Total Bilirubin: 0.3 mg/dL (ref 0.3–1.2)
Total Protein: 5.2 g/dL — ABNORMAL LOW (ref 6.5–8.1)

## 2021-09-18 LAB — TYPE AND SCREEN
ABO/RH(D): A POS
Antibody Screen: NEGATIVE

## 2021-09-18 MED ORDER — LABETALOL HCL 200 MG PO TABS
200.0000 mg | ORAL_TABLET | Freq: Every day | ORAL | Status: DC
  Administered 2021-09-18 – 2021-09-19 (×2): 200 mg via ORAL
  Filled 2021-09-18 (×2): qty 1

## 2021-09-18 MED ORDER — DIPHENHYDRAMINE HCL 25 MG PO CAPS: 25.0000 mg | ORAL_CAPSULE | Freq: Four times a day (QID) | ORAL | Status: DC | PRN

## 2021-09-18 MED ORDER — PRENATAL MULTIVITAMIN CH
1.0000 | ORAL_TABLET | Freq: Every day | ORAL | Status: DC
  Administered 2021-09-20: 1 via ORAL
  Filled 2021-09-18: qty 1

## 2021-09-18 MED ORDER — ZOLPIDEM TARTRATE 5 MG PO TABS: 5.0000 mg | ORAL_TABLET | Freq: Every evening | ORAL | Status: DC | PRN

## 2021-09-18 MED ORDER — OXYTOCIN-SODIUM CHLORIDE 30-0.9 UT/500ML-% IV SOLN
2.5000 [IU]/h | INTRAVENOUS | Status: DC
  Administered 2021-09-18: 2.5 [IU]/h via INTRAVENOUS

## 2021-09-18 MED ORDER — LACTATED RINGERS IV SOLN
500.0000 mL | Freq: Once | INTRAVENOUS | Status: AC
  Administered 2021-09-18: 500 mL via INTRAVENOUS

## 2021-09-18 MED ORDER — DIPHENHYDRAMINE HCL 50 MG/ML IJ SOLN: 12.5000 mg | INTRAMUSCULAR | Status: DC | PRN

## 2021-09-18 MED ORDER — SOD CITRATE-CITRIC ACID 500-334 MG/5ML PO SOLN
30.0000 mL | ORAL | Status: DC | PRN
Start: 2021-09-18 — End: 2021-09-18

## 2021-09-18 MED ORDER — ONDANSETRON HCL 4 MG/2ML IJ SOLN
4.0000 mg | Freq: Four times a day (QID) | INTRAMUSCULAR | Status: DC | PRN
Start: 2021-09-18 — End: 2021-09-18

## 2021-09-18 MED ORDER — ONDANSETRON HCL 4 MG PO TABS: 4.0000 mg | ORAL_TABLET | Freq: Three times a day (TID) | ORAL | Status: DC | PRN

## 2021-09-18 MED ORDER — TRANEXAMIC ACID-NACL 1000-0.7 MG/100ML-% IV SOLN
INTRAVENOUS | Status: AC
  Administered 2021-09-18: 1000 mg
  Filled 2021-09-18: qty 100

## 2021-09-18 MED ORDER — METHYLERGONOVINE MALEATE 0.2 MG/ML IJ SOLN: 0.2000 mg | INTRAMUSCULAR | Status: DC | PRN

## 2021-09-18 MED ORDER — IBUPROFEN 600 MG PO TABS
600.0000 mg | ORAL_TABLET | Freq: Four times a day (QID) | ORAL | Status: DC
  Administered 2021-09-18 – 2021-09-19 (×3): 600 mg via ORAL
  Filled 2021-09-18 (×3): qty 1

## 2021-09-18 MED ORDER — PROMETHAZINE HCL 25 MG PO TABS: 12.5000 mg | ORAL_TABLET | Freq: Four times a day (QID) | ORAL | Status: DC | PRN

## 2021-09-18 MED ORDER — LABETALOL HCL 100 MG PO TABS
100.0000 mg | ORAL_TABLET | Freq: Every day | ORAL | Status: DC
  Administered 2021-09-19 – 2021-09-20 (×2): 100 mg via ORAL
  Filled 2021-09-18 (×2): qty 1

## 2021-09-18 MED ORDER — EPHEDRINE 5 MG/ML INJ: 10.0000 mg | INTRAVENOUS | Status: DC | PRN

## 2021-09-18 MED ORDER — PHENYLEPHRINE 80 MCG/ML (10ML) SYRINGE FOR IV PUSH (FOR BLOOD PRESSURE SUPPORT): 80.0000 ug | PREFILLED_SYRINGE | INTRAVENOUS | Status: DC | PRN

## 2021-09-18 MED ORDER — OXYTOCIN BOLUS FROM INFUSION
333.0000 mL | Freq: Once | INTRAVENOUS | Status: AC
Start: 2021-09-18 — End: 2021-09-18
  Administered 2021-09-18: 333 mL via INTRAVENOUS

## 2021-09-18 MED ORDER — ACETAMINOPHEN 325 MG PO TABS: 650.0000 mg | ORAL_TABLET | ORAL | Status: DC | PRN

## 2021-09-18 MED ORDER — HYDROMORPHONE HCL 2 MG PO TABS
4.0000 mg | ORAL_TABLET | Freq: Four times a day (QID) | ORAL | Status: DC
  Administered 2021-09-18 – 2021-09-19 (×2): 4 mg via ORAL
  Filled 2021-09-18 (×2): qty 2

## 2021-09-18 MED ORDER — BUPROPION HCL ER (SR) 100 MG PO TB12
200.0000 mg | ORAL_TABLET | Freq: Two times a day (BID) | ORAL | Status: DC
  Administered 2021-09-18 – 2021-09-20 (×4): 200 mg via ORAL
  Filled 2021-09-18 (×5): qty 2

## 2021-09-18 MED ORDER — ONDANSETRON HCL 4 MG/2ML IJ SOLN: 4.0000 mg | INTRAMUSCULAR | Status: DC | PRN

## 2021-09-18 MED ORDER — TERBUTALINE SULFATE 1 MG/ML IJ SOLN: 0.2500 mg | Freq: Once | INTRAMUSCULAR | Status: DC | PRN

## 2021-09-18 MED ORDER — OXYTOCIN-SODIUM CHLORIDE 30-0.9 UT/500ML-% IV SOLN
1.0000 m[IU]/min | INTRAVENOUS | Status: DC
  Administered 2021-09-18: 2 m[IU]/min via INTRAVENOUS
  Filled 2021-09-18: qty 500

## 2021-09-18 MED ORDER — COCONUT OIL OIL: 1.0000 | TOPICAL_OIL | Status: DC | PRN

## 2021-09-18 MED ORDER — BENZOCAINE-MENTHOL 20-0.5 % EX AERO: 1.0000 | INHALATION_SPRAY | CUTANEOUS | Status: DC | PRN

## 2021-09-18 MED ORDER — SERTRALINE HCL 50 MG PO TABS
50.0000 mg | ORAL_TABLET | Freq: Every day | ORAL | Status: DC
  Administered 2021-09-18 – 2021-09-20 (×3): 50 mg via ORAL
  Filled 2021-09-18 (×3): qty 1

## 2021-09-18 MED ORDER — METHYLERGONOVINE MALEATE 0.2 MG PO TABS: 0.2000 mg | ORAL_TABLET | ORAL | Status: DC | PRN

## 2021-09-18 MED ORDER — TAMSULOSIN HCL 0.4 MG PO CAPS
0.4000 mg | ORAL_CAPSULE | Freq: Every day | ORAL | Status: DC
  Administered 2021-09-18 – 2021-09-20 (×3): 0.4 mg via ORAL
  Filled 2021-09-18 (×3): qty 1

## 2021-09-18 MED ORDER — LACTATED RINGERS IV SOLN
500.0000 mL | INTRAVENOUS | Status: DC | PRN
  Administered 2021-09-18: 1000 mL via INTRAVENOUS

## 2021-09-18 MED ORDER — WITCH HAZEL-GLYCERIN EX PADS: 1.0000 | MEDICATED_PAD | CUTANEOUS | Status: DC | PRN

## 2021-09-18 MED ORDER — LABETALOL HCL 100 MG PO TABS: 100.0000 mg | ORAL_TABLET | ORAL | Status: DC

## 2021-09-18 MED ORDER — FENTANYL-BUPIVACAINE-NACL 0.5-0.125-0.9 MG/250ML-% EP SOLN
12.0000 mL/h | EPIDURAL | Status: DC | PRN
  Administered 2021-09-18: 12 mL/h via EPIDURAL
  Filled 2021-09-18: qty 250

## 2021-09-18 MED ORDER — ONDANSETRON HCL 4 MG PO TABS: 4.0000 mg | ORAL_TABLET | ORAL | Status: DC | PRN

## 2021-09-18 MED ORDER — SENNOSIDES-DOCUSATE SODIUM 8.6-50 MG PO TABS
2.0000 | ORAL_TABLET | Freq: Every day | ORAL | Status: DC
  Administered 2021-09-19: 2 via ORAL
  Filled 2021-09-18: qty 2

## 2021-09-18 MED ORDER — SIMETHICONE 80 MG PO CHEW: 80.0000 mg | CHEWABLE_TABLET | ORAL | Status: DC | PRN

## 2021-09-18 MED ORDER — LIDOCAINE-EPINEPHRINE (PF) 2 %-1:200000 IJ SOLN
INTRAMUSCULAR | Status: DC | PRN
  Administered 2021-09-18: 5 mL via EPIDURAL

## 2021-09-18 MED ORDER — TETANUS-DIPHTH-ACELL PERTUSSIS 5-2.5-18.5 LF-MCG/0.5 IM SUSY: 0.5000 mL | PREFILLED_SYRINGE | Freq: Once | INTRAMUSCULAR | Status: DC

## 2021-09-18 MED ORDER — LACTATED RINGERS IV SOLN: INTRAVENOUS | Status: DC

## 2021-09-18 MED ORDER — CYCLOBENZAPRINE HCL 10 MG PO TABS
5.0000 mg | ORAL_TABLET | Freq: Three times a day (TID) | ORAL | Status: DC | PRN
Start: 2021-09-18 — End: 2021-09-20

## 2021-09-18 MED ORDER — LIDOCAINE HCL (PF) 1 % IJ SOLN: 30.0000 mL | INTRAMUSCULAR | Status: DC | PRN

## 2021-09-18 MED ORDER — DIBUCAINE (PERIANAL) 1 % EX OINT: 1.0000 | TOPICAL_OINTMENT | CUTANEOUS | Status: DC | PRN

## 2021-09-18 NOTE — Anesthesia Postprocedure Evaluation (Signed)
Anesthesia Post Note  Patient: Carol Pugh  Procedure(s) Performed: AN AD HOC LABOR EPIDURAL     Patient location during evaluation: Mother Baby Anesthesia Type: Epidural Level of consciousness: awake and alert Pain management: pain level controlled Vital Signs Assessment: post-procedure vital signs reviewed and stable Respiratory status: spontaneous breathing, nonlabored ventilation and respiratory function stable Cardiovascular status: stable Postop Assessment: no headache, no backache and epidural receding Anesthetic complications: no   No notable events documented.  Last Vitals:  Vitals:   09/18/21 1708 09/18/21 1815  BP: 130/79 128/83  Pulse: 87 93  Resp: 20 20  Temp: 36.7 C   SpO2: 100%     Last Pain:  Vitals:   09/18/21 1848  TempSrc:   PainSc: 0-No pain   Pain Goal: Patients Stated Pain Goal: 2 (09/18/21 0741)                 Marrion Coy

## 2021-09-18 NOTE — Anesthesia Procedure Notes (Signed)
Epidural Patient location during procedure: OB Start time: 09/18/2021 8:05 AM End time: 09/18/2021 8:15 AM  Staffing Anesthesiologist: Elmer Picker, MD Performed: anesthesiologist   Preanesthetic Checklist Completed: patient identified, IV checked, risks and benefits discussed, monitors and equipment checked, pre-op evaluation and timeout performed  Epidural Patient position: sitting Prep: DuraPrep and site prepped and draped Patient monitoring: continuous pulse ox, blood pressure, heart rate and cardiac monitor Approach: midline Location: L3-L4 Injection technique: LOR air  Needle:  Needle type: Tuohy  Needle gauge: 17 G Needle length: 9 cm Needle insertion depth: 7 cm Catheter type: closed end flexible Catheter size: 19 Gauge Catheter at skin depth: 12 cm Test dose: negative  Assessment Sensory level: T8 Events: blood not aspirated, injection not painful, no injection resistance, no paresthesia and negative IV test  Additional Notes Patient identified. Risks/Benefits/Options discussed with patient including but not limited to bleeding, infection, nerve damage, paralysis, failed block, incomplete pain control, headache, blood pressure changes, nausea, vomiting, reactions to medication both or allergic, itching and postpartum back pain. Confirmed with bedside nurse the patient's most recent platelet count. Confirmed with patient that they are not currently taking any anticoagulation, have any bleeding history or any family history of bleeding disorders. Patient expressed understanding and wished to proceed. All questions were answered. Sterile technique was used throughout the entire procedure. Please see nursing notes for vital signs. Test dose was given through epidural catheter and negative prior to continuing to dose epidural or start infusion. Warning signs of high block given to the patient including shortness of breath, tingling/numbness in hands, complete motor block, or  any concerning symptoms with instructions to call for help. Patient was given instructions on fall risk and not to get out of bed. All questions and concerns addressed with instructions to call with any issues or inadequate analgesia.  Reason for block:procedure for pain

## 2021-09-18 NOTE — Lactation Note (Signed)
This note was copied from a baby's chart. Lactation Consultation Note  Patient Name: Carol Pugh [id number] Date: 09/18/2021 Reason for consult: Initial assessment;Early term 37-38.6wks Age:26 hours   P2: Early term infant at 37+0 weeks Feeding preference: Breast  "Carol Pugh" was asleep STS on birth parent's chest when I arrived.  Reviewed breast feeding basics.  Encouraged feeding at least every three hours due to gestational age or sooner if baby shows feeding cues. Suggested lots of STS, breast massage and hand expression.  Advised to call her RN/LC for assistance as needed.  Visitor present.   Maternal Data Has patient been taught Hand Expression?: Yes Does the patient have breastfeeding experience prior to this delivery?: Yes How long did the patient breastfeed?: 18 months  Feeding Mother's Current Feeding Choice: Breast Milk  LATCH Score Latch: Grasps breast easily, tongue down, lips flanged, rhythmical sucking.  Audible Swallowing: None  Type of Nipple: Everted at rest and after stimulation  Comfort (Breast/Nipple): Soft / non-tender  Hold (Positioning): Assistance needed to correctly position infant at breast and maintain latch.  LATCH Score: 7   Lactation Tools Discussed/Used    Interventions Interventions: Skin to skin;Assisted with latch  Discharge Pump: Personal  Consult Status Consult Status: Follow-up Date: 09/19/21 Follow-up type: In-patient    Syanne Looney R Tyann Niehaus 09/18/2021, 6:18 PM

## 2021-09-18 NOTE — Lactation Note (Signed)
This note was copied from a baby's chart. Lactation Consultation Note  Patient Name: Carol Pugh [id number] Date: 09/18/2021 Reason for consult: L&D Initial assessment;Early term 37-38.6wks Age:26 hours   P2: Early term infant at 37+0 weeks Feeding preference: Breast  RN completing measurements when I arrived.  Assisted to latch easily and "JW" began sucking actively.  Reassured parents that lactation services will be available on the M/B unit.  Support person at bedside.   Maternal Data    Feeding Mother's Current Feeding Choice: Breast Milk  LATCH Score Latch: Grasps breast easily, tongue down, lips flanged, rhythmical sucking.  Audible Swallowing: None  Type of Nipple: Everted at rest and after stimulation  Comfort (Breast/Nipple): Soft / non-tender  Hold (Positioning): Assistance needed to correctly position infant at breast and maintain latch.  LATCH Score: 7   Lactation Tools Discussed/Used    Interventions Interventions: Skin to skin;Assisted with latch  Discharge    Consult Status Consult Status: Follow-up from L&D    Alanny Rivers R Anel Purohit 09/18/2021, 4:12 PM

## 2021-09-18 NOTE — Anesthesia Preprocedure Evaluation (Addendum)
Anesthesia Evaluation  Patient identified by MRN, date of birth, ID band Patient awake    Reviewed: Allergy & Precautions, NPO status , Patient's Chart, lab work & pertinent test results, reviewed documented beta blocker date and time   Airway Mallampati: II  TM Distance: >3 FB Neck ROM: Full    Dental no notable dental hx. (+) Teeth Intact, Dental Advisory Given   Pulmonary neg pulmonary ROS, former smoker,    Pulmonary exam normal breath sounds clear to auscultation       Cardiovascular hypertension, Pt. on medications and Pt. on home beta blockers Normal cardiovascular exam Rhythm:Regular Rate:Normal     Neuro/Psych  Headaches, PSYCHIATRIC DISORDERS Anxiety Depression    GI/Hepatic negative GI ROS, Neg liver ROS,   Endo/Other  negative endocrine ROS  Renal/GU Renal disease  negative genitourinary   Musculoskeletal negative musculoskeletal ROS (+) Marfan's   Abdominal   Peds  Hematology negative hematology ROS (+)   Anesthesia Other Findings IOL for cHTN and nephrolithiasis   Reproductive/Obstetrics (+) Pregnancy                            Anesthesia Physical Anesthesia Plan  ASA: 3  Anesthesia Plan: Epidural   Post-op Pain Management:    Induction:   PONV Risk Score and Plan: Treatment may vary due to age or medical condition  Airway Management Planned: Natural Airway  Additional Equipment:   Intra-op Plan:   Post-operative Plan:   Informed Consent: I have reviewed the patients History and Physical, chart, labs and discussed the procedure including the risks, benefits and alternatives for the proposed anesthesia with the patient or authorized representative who has indicated his/her understanding and acceptance.       Plan Discussed with: Anesthesiologist  Anesthesia Plan Comments: (Patient identified. Risks, benefits, options discussed with patient including but not  limited to bleeding, infection, nerve damage, paralysis, failed block, incomplete pain control, headache, blood pressure changes, nausea, vomiting, reactions to medication, itching, and post partum back pain. Confirmed with bedside nurse the patient's most recent platelet count. Confirmed with the patient that they are not taking any anticoagulation, have any bleeding history or any family history of bleeding disorders. Patient expressed understanding and wishes to proceed. All questions were answered. )        Anesthesia Quick Evaluation

## 2021-09-18 NOTE — Progress Notes (Signed)
Carol Pugh is a 26 y.o. G2P1001 at [redacted]w[redacted]d by LMP admitted for induction of labor due to chtn , ongoing flank pain secondary to urolithiasis.  Subjective: Pain well controlled  Objective: BP (!) 130/49   Pulse 93   Temp 98.3 F (36.8 C) (Oral)   Resp 16   Ht 5\' 10"  (1.778 m)   Wt 116.8 kg   LMP 12/25/2020   SpO2 98%   Breastfeeding Yes   BMI 36.93 kg/m  I/O last 3 completed shifts: In: 480 [P.O.:480] Out: 3875 [Urine:3875] No intake/output data recorded.  FHT:  FHR: 145 bpm, variability: moderate,  accelerations:  Present,  decelerations:  Absent UC:   irregular, every 6 minutes SVE:    per RN  Labs: Lab Results  Component Value Date   WBC 8.9 09/12/2021   HGB 12.1 09/12/2021   HCT 35.8 (L) 09/12/2021   MCV 87.5 09/12/2021   PLT 252 09/12/2021    Assessment / Plan: IOL for medical indications  Labor: Progressing on Pitocin, will continue to increase then AROM Preeclampsia:  no signs or symptoms of toxicity Fetal Wellbeing:  Category I Pain Control:  Labor support without medications, epidural prn, dc pca I/D:  n/a Anticipated MOD:  NSVD  09/14/2021, MD 09/18/2021, 6:41 AM

## 2021-09-19 LAB — CBC
HCT: 32.4 % — ABNORMAL LOW (ref 36.0–46.0)
Hemoglobin: 10.7 g/dL — ABNORMAL LOW (ref 12.0–15.0)
MCH: 29.5 pg (ref 26.0–34.0)
MCHC: 33 g/dL (ref 30.0–36.0)
MCV: 89.3 fL (ref 80.0–100.0)
Platelets: 182 10*3/uL (ref 150–400)
RBC: 3.63 MIL/uL — ABNORMAL LOW (ref 3.87–5.11)
RDW: 14.1 % (ref 11.5–15.5)
WBC: 9.8 10*3/uL (ref 4.0–10.5)
nRBC: 0 % (ref 0.0–0.2)

## 2021-09-19 MED ORDER — IBUPROFEN 600 MG PO TABS: 600.0000 mg | ORAL_TABLET | Freq: Four times a day (QID) | ORAL | Status: DC

## 2021-09-19 MED ORDER — KETOROLAC TROMETHAMINE 30 MG/ML IJ SOLN
30.0000 mg | Freq: Four times a day (QID) | INTRAMUSCULAR | Status: DC
  Administered 2021-09-19 – 2021-09-20 (×5): 30 mg via INTRAVENOUS
  Filled 2021-09-19 (×4): qty 1

## 2021-09-19 MED ORDER — KETOROLAC TROMETHAMINE 30 MG/ML IJ SOLN
INTRAMUSCULAR | Status: AC
  Filled 2021-09-19: qty 1

## 2021-09-19 MED ORDER — OXYBUTYNIN CHLORIDE 5 MG PO TABS
5.0000 mg | ORAL_TABLET | Freq: Three times a day (TID) | ORAL | Status: DC
  Administered 2021-09-19 – 2021-09-20 (×2): 5 mg via ORAL
  Filled 2021-09-19 (×4): qty 1

## 2021-09-19 NOTE — Progress Notes (Signed)
   PPD #1 S/P NSVD  Live born female  Birth Weight: 7 lb 7.6 oz (3390 g) APGAR: 8, 9  Newborn Delivery   Birth date/time: 09/18/2021 14:52:00 Delivery type: Vaginal, Spontaneous     Baby name: Jed  Delivering provider: Olivia Mackie   Lacerations: None   Circumcision: Yes, planning  Feeding: breast and bottle  Pain control at delivery: Epidural   S:  Reports feeling well, reports moderate back pain related to kidney stone, has taken Toradol             Tolerating PO/No nausea or vomiting             Bleeding is light             Pain controlled with acetaminophen and prescription NSAID's including ketorolac (Toradol)             Up ad lib/ambulatory/voiding without difficulties   O:  A & O x 3, in no apparent distress  Vitals:   09/18/21 2228 09/18/21 2353 09/19/21 0202 09/19/21 0608  BP: (!) 106/58 121/77 122/71 129/88  Pulse: 91 89 89 86  Resp: 18  18 18   Temp:      TempSrc:      SpO2: 100%  100% 100%  Weight:      Height:       Recent Labs    09/18/21 0625 09/19/21 0510  WBC 7.7 9.8  HGB 12.1 10.7*  HCT 36.9 32.4*  PLT 240 182    Blood type: --/--/A POS (09/06 08-06-1972)  Rubella: Immune (03/02 0000)   I&O: I/O last 3 completed shifts: In: -  Out: 1305 [Urine:1200; Blood:105]          No intake/output data recorded.  Gen: AAO x 3, NAD Abdomen: soft, non-tender, non-distended Fundus: firm, non-tender, U-1 Perineum: intact Lochia: small Extremities: no edema, no calf pain or tenderness   A/P:  PPD # 1 26 y.o., 11-21-1991  Principal Problem:   Postpartum care following vaginal delivery 9/6  Doing well - stable status  Routine post partum orders Active Problems:   Depression, recurrent (HCC)  Continue Wellbutrin and Zoloft  Close PP F/U for mood   Chronic hypertension affecting pregnancy  BPs normotensive  Labs stable  Continue PO Labetalol   Marfan syndrome   Kidney stone complicating pregnancy  Nephrology following  Toradol for pain    Encounter for induction of labor   SVD (spontaneous vaginal delivery)  Anticipate discharge tomorrow.   11/6, MSN, CNM 09/19/2021, 9:28 AM

## 2021-09-19 NOTE — Lactation Note (Signed)
Dt Taavon discussed potentially putting this patient on Ditropan if safe while breastfeeding. LC consulted on Ditropan  regarding compatibility with breastfeeding. LC stated it is an L3 and does not transfer into breast milk

## 2021-09-19 NOTE — Social Work (Signed)
MOB was referred for history of depression/anxiety.  * Referral screened out by Clinical Social Worker because none of the following criteria appear to apply: ~ History of anxiety/depression during this pregnancy, or of post-partum depression following prior delivery. ~ Diagnosis of anxiety and/or depression within last 3 years OR * MOB's symptoms currently being treated with medication and/or therapy. Per chart review, Anxiety/Depression well controlled on Zoloft 50 mg daily. MOB completed Edinburgh score 7.  Please contact the Clinical Social Worker if needs arise, by MOB request, or if MOB scores greater than 9/yes to question 10 on Edinburgh Postpartum Depression Screen.   Garek Schuneman, MSW, LCSW [facility]  Clinical Social Worker  [phone] 09/19/2021  8:47 AM  

## 2021-09-19 NOTE — Lactation Note (Signed)
This note was copied from a baby's chart. Lactation Consultation Note  Patient Name: Carol Pugh [id number] Date: 09/19/2021 Reason for consult: Follow-up assessment;Mother's request;Difficult latch;Early term 37-38.6wks;Breastfeeding assistance Age:26 hours  LC called to observe a feeding. Infant latched in cradle on his stomach with signs of milk transfer, increased with breast compression.  Infant still feeding at the end of the visit.  Plan 1. To feed based on cues 8-12 x 24 hr period.  2. Offer breasts and look for signs of milk transfer.  3. If unable to latch, hand express and offer colostrum then try a latch.  All questions answered at the end of the visit.   Maternal Data    Feeding Mother's Current Feeding Choice: Breast Milk  LATCH Score Latch: Repeated attempts needed to sustain latch, nipple held in mouth throughout feeding, stimulation needed to elicit sucking reflex.  Audible Swallowing: Spontaneous and intermittent  Type of Nipple: Everted at rest and after stimulation  Comfort (Breast/Nipple): Soft / non-tender  Hold (Positioning): Assistance needed to correctly position infant at breast and maintain latch.  LATCH Score: 8   Lactation Tools Discussed/Used Tools: Pump;Flanges Breast pump type: Manual Pump Education: Setup, frequency, and cleaning;Milk Storage Reason for Pumping: increase stimulation Pumping frequency: pump after each feeding 10 mins each breast  Interventions Interventions: Breast feeding basics reviewed;Assisted with latch;Skin to skin;Breast massage;Breast compression;Position options;Expressed milk;Hand pump;Education;LC Psychologist, educational;Infant Driven Feeding Algorithm education  Discharge Pump: Manual;Personal  Consult Status Consult Status: Follow-up Date: 09/20/21 Follow-up type: In-patient    Hetal Proano  Nicholson-Springer 09/19/2021, 5:16 PM

## 2021-09-20 NOTE — Discharge Summary (Signed)
Postpartum Discharge Summary  Date of Service updated 09/20/21     Patient Name: Carol Pugh DOB: 1995-01-30 MRN: [id number]  Date of admission: 09/12/2021 Delivery date:09/18/2021  Delivering provider: Brien Few  Date of discharge: 09/20/2021  Admitting diagnosis: Kidney stone complicating pregnancy [F02.774, N20.0] Encounter for induction of labor [Z34.90] Intrauterine pregnancy: [redacted]w[redacted]d    Secondary diagnosis:  Principal Problem:   Postpartum care following vaginal delivery 9/6 Active Problems:   Depression, recurrent ([place]   Chronic hypertension affecting pregnancy   Marfan syndrome   Kidney stone complicating pregnancy   Encounter for induction of labor   SVD (spontaneous vaginal delivery)  Additional problems: N/A    Discharge diagnosis: Term Pregnancy Delivered                                              Post partum procedures: N/A Augmentation: AROM Complications: None  Hospital course: Induction of Labor With Vaginal Delivery   26y.o. yo G2P2002 at 343w0das admitted to the hospital 09/12/2021 for induction of labor.  Indication for induction:  Kidney stones complicating pregnancy .  Patient had an uncomplicated labor course as follows: Membrane Rupture Time/Date: 8:15 AM ,09/18/2021   Delivery Method:Vaginal, Spontaneous  Episiotomy: None  Lacerations:  None  Details of delivery can be found in separate delivery note.  Patient had a routine postpartum course. Patient is discharged home 09/20/21.  Newborn Data: Birth date:09/18/2021  Birth time:2:52 PM  Gender:Female  Living status:Living  Apgars:8 ,9  Weight:3390 g   Magnesium Sulfate received: No BMZ received: No Rhophylac:N/A MMR:N/A   Physical exam  Vitals:   09/19/21 1712 09/19/21 2149 09/20/21 0557 09/20/21 1059  BP: 128/74 117/66 124/84 120/73  Pulse: 97 90 75 91  Resp:  18 18   Temp:  98.1 F (36.7 C) 98.2 F (36.8 C)   TempSrc:  Oral Oral   SpO2: 97% 99%  99%  Weight:      Height:        General: alert and no distress Lochia: appropriate Uterine Fundus: firm Incision: N/A DVT Evaluation: No evidence of DVT seen on physical exam. Labs: Lab Results  Component Value Date   WBC 9.8 09/19/2021   HGB 10.7 (L) 09/19/2021   HCT 32.4 (L) 09/19/2021   MCV 89.3 09/19/2021   PLT 182 09/19/2021      Latest Ref Rng & Units 09/18/2021    6:28 AM  CMP  Glucose 70 - 99 mg/dL 101   BUN 6 - 20 mg/dL <5   Creatinine 0.44 - 1.00 mg/dL 0.74   Sodium 135 - 145 mmol/L 138   Potassium 3.5 - 5.1 mmol/L 4.2   Chloride 98 - 111 mmol/L 109   CO2 22 - 32 mmol/L 20   Calcium 8.9 - 10.3 mg/dL 8.8   Total Protein 6.5 - 8.1 g/dL 5.2   Total Bilirubin 0.3 - 1.2 mg/dL 0.3   Alkaline Phos 38 - 126 U/L 97   AST 15 - 41 U/L 19   ALT 0 - 44 U/L 17    Edinburgh Score:    09/18/2021    5:08 PM  Edinburgh Postnatal Depression Scale Screening Tool  I have been able to laugh and see the funny side of things. 0  I have looked forward with enjoyment to things. 0  I have blamed myself  unnecessarily when things went wrong. 3  I have been anxious or worried for no good reason. 2  I have felt scared or panicky for no good reason. 1  Things have been getting on top of me. 1  I have been so unhappy that I have had difficulty sleeping. 0  I have felt sad or miserable. 0  I have been so unhappy that I have been crying. 0  The thought of harming myself has occurred to me. 0  Edinburgh Postnatal Depression Scale Total 7      After visit meds:  Allergies as of 09/20/2021       Reactions   Bee Venom Anaphylaxis, Hives, Swelling   Ciprofloxacin Other (See Comments)   Severe Marfan interaction        Medication List     STOP taking these medications    aspirin EC 81 MG tablet       TAKE these medications    buPROPion 200 MG 12 hr tablet Commonly known as: WELLBUTRIN SR TAKE 1 TABLET BY MOUTH TWICE DAILY   cyclobenzaprine 5 MG tablet Commonly known as: FLEXERIL Take 1 tablet (5  mg total) by mouth 3 (three) times daily as needed for muscle spasms.   docusate sodium 100 MG capsule Commonly known as: COLACE Take 100 mg by mouth 2 (two) times daily as needed for mild constipation.   EPINEPHrine 0.3 mg/0.3 mL Soaj injection Commonly known as: EPI-PEN Inject into the muscle.   HYDROmorphone 4 MG tablet Commonly known as: DILAUDID Take 4 mg by mouth every 6 (six) hours. pain   labetalol 200 MG tablet Commonly known as: NORMODYNE TAKE 1 TABLET BY MOUTH TWICE DAILY What changed:  how much to take when to take this additional instructions   multivitamin-prenatal 27-0.8 MG Tabs tablet Take 1 tablet by mouth daily at 12 noon.   ondansetron 4 MG tablet Commonly known as: ZOFRAN Take 4 mg by mouth every 8 (eight) hours as needed for nausea or vomiting.   promethazine 12.5 MG tablet Commonly known as: PHENERGAN Take 12.5 mg by mouth every 6 (six) hours as needed for nausea or vomiting.   sertraline 50 MG tablet Commonly known as: ZOLOFT Take 50 mg by mouth daily.   SUMAtriptan 100 MG tablet Commonly known as: IMITREX Take 100 mg by mouth every 2 (two) hours as needed for migraine. May repeat in 2 hours if headache persists or recurs.   tamsulosin 0.4 MG Caps capsule Commonly known as: FLOMAX Take 1 capsule (0.4 mg total) by mouth daily.               Discharge Care Instructions  (From admission, onward)           Start     Ordered   09/20/21 0000  Discharge wound care:       Comments: Sitz baths 2 times /day with warm water x 1 week. May add herbals: 1 ounce dried comfrey leaf* 1 ounce calendula flowers 1 ounce lavender flowers  Supplies can be found online at [employer] sources at [employer], [employer]  1/2 ounce dried uva ursi leaves 1/2 ounce witch hazel blossoms (if you can find them) 1/2 ounce dried sage leaf 1/2 cup sea salt Directions: Bring 2 quarts of water to a boil. Turn off heat, and place 1 ounce  (approximately 1 large handful) of the above mixed herbs (not the salt) into the pot. Steep, covered, for 30 minutes.  Strain the  liquid well with a fine mesh strainer, and discard the herb material. Add 2 quarts of liquid to the tub, along with the 1/2 cup of salt. This medicinal liquid can also be made into compresses and peri-rinses.   09/20/21 1231             Discharge home in stable condition Infant Feeding: Breast Infant Disposition:home with mother Discharge instruction: per After Visit Summary and Postpartum booklet. Activity: Advance as tolerated. Pelvic rest for 6 weeks.  Diet: routine diet Anticipated Birth Control: Unsure Postpartum Appointment:6 weeks Additional Postpartum F/U:  N/A Future Appointments:No future appointments. Follow up Visit:  Patient scheduled for 6 week postpartum visit with Dr. Ronita Hipps Patient is also scheduled for outpatient Urology follow up    09/20/2021 Tenzin Pavon A Averill Pons, DO

## 2021-09-20 NOTE — Lactation Note (Signed)
This note was copied from a baby's chart. Lactation Consultation Note  Patient Name: Carol Pugh [id number] Date: 09/20/2021 Reason for consult: Follow-up assessment;Early term 37-38.6wks Age:26 hours  P2, Baby sleeping.  Mother pumped 60 ml. Discussed early term feeding behavior, wake or pump as needed. Reviewed engorgement care and monitoring voids/stools.   Feeding Mother's Current Feeding Choice: Breast Milk   Lactation Tools Discussed/Used Pumped volume: 60 mL  Interventions Interventions: Education  Discharge Discharge Education: Engorgement and breast care;Warning signs for feeding baby Pump: DEBP;Personal (Medela)  Consult Status Consult Status: Complete Date: 09/20/21    [name] [facility] 09/20/2021, 11:37 AM

## 2021-09-26 ENCOUNTER — Telehealth (HOSPITAL_COMMUNITY): Payer: Self-pay | Admitting: *Deleted

## 2021-09-26 NOTE — Progress Notes (Signed)
Opened telephone encounter in error.

## 2021-09-30 ENCOUNTER — Other Ambulatory Visit: Payer: Self-pay | Admitting: Urology

## 2021-10-07 ENCOUNTER — Encounter (HOSPITAL_BASED_OUTPATIENT_CLINIC_OR_DEPARTMENT_OTHER): Payer: Self-pay | Admitting: Urology

## 2021-10-08 ENCOUNTER — Encounter (HOSPITAL_BASED_OUTPATIENT_CLINIC_OR_DEPARTMENT_OTHER): Payer: Self-pay | Admitting: Urology

## 2021-10-08 NOTE — Progress Notes (Addendum)
Addendum:  Chart reviewed w/ anesthesia, Dr Roanna Banning MDA.  Dr Roanna Banning stated ok to proceed.   Spoke w/ via phone for pre-op interview--- pt Lab needs dos----   [employer], (ask mda if urine preg needed pt will be 4 weeks postpartum)            Lab results------ current 12 lead ekg tracing received via fax from cardiologist, Dr Marko Plume, placed with chart COVID test -----patient states asymptomatic no test needed Arrive at ------- 1130 on 10-18-2021 NPO after MN NO Solid Food.  Clear liquids from MN until--- 1030 Med rec completed Medications to take morning of surgery ----- wellbutrin, zoloft, labetalol, flomax Diabetic medication ----- n/a Patient instructed no nail polish to be worn day of surgery Patient instructed to bring photo id and insurance card day of surgery Patient aware to have Driver (ride ) / caregiver  for 24 hours after surgery -- husband, zachary Patient Special Instructions ----- n/a Pre-Op special Istructions ----- pt had SVD 09-18-2021 Patient verbalized understanding of instructions that were given at this phone interview. Patient denies shortness of breath, chest pain, fever, cough at this phone interview.    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  Pt will be 4 wks postpartum dos.  PCP: Dr Argentina Donovan Cardiologist : primary -- Elwanda [place] PA [facility] 08-21-2021 epic) Aortopathy clinic:  Dr Ali Lowe [facility] 09-04-2021 epic) EKG :  03-20-2021 [facility]  (12 lead tracing w/ chart) Echo : 09-04-2021  [facility] Stress test: no Cardiac Cath :  no Activity level: denies sob w/ any activity

## 2021-10-18 ENCOUNTER — Ambulatory Visit (HOSPITAL_BASED_OUTPATIENT_CLINIC_OR_DEPARTMENT_OTHER): Payer: No Typology Code available for payment source | Admitting: Anesthesiology

## 2021-10-18 ENCOUNTER — Encounter (HOSPITAL_BASED_OUTPATIENT_CLINIC_OR_DEPARTMENT_OTHER)
Admission: RE | Disposition: A | Discharge status: Home / Self Care | Payer: Self-pay | Source: Home / Self Care | Attending: Urology

## 2021-10-18 ENCOUNTER — Encounter (HOSPITAL_BASED_OUTPATIENT_CLINIC_OR_DEPARTMENT_OTHER): Payer: Self-pay | Admitting: Urology

## 2021-10-18 ENCOUNTER — Ambulatory Visit (HOSPITAL_BASED_OUTPATIENT_CLINIC_OR_DEPARTMENT_OTHER)
Admission: RE | Admit: 2021-10-18 | Discharge: 2021-10-18 | Disposition: A | Discharge status: Home / Self Care | Payer: No Typology Code available for payment source | Attending: Urology | Admitting: Urology

## 2021-10-18 ENCOUNTER — Other Ambulatory Visit: Payer: Self-pay

## 2021-10-18 DIAGNOSIS — Z6832 Body mass index (BMI) 32.0-32.9, adult: Secondary | ICD-10-CM | POA: Diagnosis not present

## 2021-10-18 DIAGNOSIS — Z87891 Personal history of nicotine dependence: Secondary | ICD-10-CM | POA: Diagnosis not present

## 2021-10-18 DIAGNOSIS — K219 Gastro-esophageal reflux disease without esophagitis: Secondary | ICD-10-CM | POA: Diagnosis not present

## 2021-10-18 DIAGNOSIS — N202 Calculus of kidney with calculus of ureter: Secondary | ICD-10-CM | POA: Diagnosis present

## 2021-10-18 DIAGNOSIS — N200 Calculus of kidney: Secondary | ICD-10-CM

## 2021-10-18 DIAGNOSIS — I10 Essential (primary) hypertension: Secondary | ICD-10-CM | POA: Diagnosis not present

## 2021-10-18 DIAGNOSIS — E669 Obesity, unspecified: Secondary | ICD-10-CM | POA: Insufficient documentation

## 2021-10-18 DIAGNOSIS — Q8740 Marfan's syndrome, unspecified: Secondary | ICD-10-CM | POA: Insufficient documentation

## 2021-10-18 DIAGNOSIS — Z01818 Encounter for other preprocedural examination: Secondary | ICD-10-CM

## 2021-10-18 HISTORY — DX: Presence of spectacles and contact lenses: Z97.3

## 2021-10-18 HISTORY — DX: Marfan syndrome with aortic dilation: Q87.410

## 2021-10-18 HISTORY — PX: CYSTOSCOPY/URETEROSCOPY/HOLMIUM LASER/STENT PLACEMENT: SHX6546

## 2021-10-18 HISTORY — DX: Gastro-esophageal reflux disease without esophagitis: K21.9

## 2021-10-18 HISTORY — DX: Generalized anxiety disorder: F41.1

## 2021-10-18 HISTORY — DX: Major depressive disorder, single episode, unspecified: F32.9

## 2021-10-18 HISTORY — DX: Calculus of kidney: N20.0

## 2021-10-18 HISTORY — DX: Migraine with aura, not intractable, with status migrainosus: G43.101

## 2021-10-18 HISTORY — DX: Personal history of other diseases of urinary system: Z87.448

## 2021-10-18 LAB — POCT I-STAT, CHEM 8
BUN: 11 mg/dL (ref 6–20)
Calcium, Ion: 1.18 mmol/L (ref 1.15–1.40)
Chloride: 106 mmol/L (ref 98–111)
Creatinine, Ser: 1 mg/dL (ref 0.44–1.00)
Glucose, Bld: 79 mg/dL (ref 70–99)
HCT: 43 % (ref 36.0–46.0)
Hemoglobin: 14.6 g/dL (ref 12.0–15.0)
Potassium: 3.9 mmol/L (ref 3.5–5.1)
Sodium: 142 mmol/L (ref 135–145)
TCO2: 23 mmol/L (ref 22–32)

## 2021-10-18 LAB — POCT PREGNANCY, URINE: Preg Test, Ur: NEGATIVE

## 2021-10-18 SURGERY — CYSTOSCOPY/URETEROSCOPY/HOLMIUM LASER/STENT PLACEMENT
Anesthesia: General | Site: Pelvis | Laterality: Bilateral

## 2021-10-18 MED ORDER — DEXAMETHASONE SODIUM PHOSPHATE 10 MG/ML IJ SOLN
INTRAMUSCULAR | Status: DC | PRN
  Administered 2021-10-18: 10 mg via INTRAVENOUS

## 2021-10-18 MED ORDER — CEFAZOLIN SODIUM-DEXTROSE 2-4 GM/100ML-% IV SOLN
2.0000 g | INTRAVENOUS | Status: AC
  Administered 2021-10-18: 2 g via INTRAVENOUS

## 2021-10-18 MED ORDER — HYDROMORPHONE HCL 4 MG PO TABS: 4.0000 mg | ORAL_TABLET | Freq: Four times a day (QID) | ORAL | 0 refills | Status: AC

## 2021-10-18 MED ORDER — CEFAZOLIN SODIUM-DEXTROSE 2-4 GM/100ML-% IV SOLN
INTRAVENOUS | Status: AC
  Filled 2021-10-18: qty 100

## 2021-10-18 MED ORDER — GLYCOPYRROLATE PF 0.2 MG/ML IJ SOSY
PREFILLED_SYRINGE | INTRAMUSCULAR | Status: AC
  Filled 2021-10-18: qty 1

## 2021-10-18 MED ORDER — ONDANSETRON HCL 4 MG/2ML IJ SOLN
INTRAMUSCULAR | Status: DC | PRN
  Administered 2021-10-18: 4 mg via INTRAVENOUS

## 2021-10-18 MED ORDER — ONDANSETRON HCL 4 MG/2ML IJ SOLN
INTRAMUSCULAR | Status: AC
  Filled 2021-10-18: qty 2

## 2021-10-18 MED ORDER — SODIUM CHLORIDE 0.9 % IR SOLN
Status: DC | PRN
  Administered 2021-10-18: 3000 mL

## 2021-10-18 MED ORDER — PROPOFOL 10 MG/ML IV BOLUS
INTRAVENOUS | Status: AC
  Filled 2021-10-18: qty 20

## 2021-10-18 MED ORDER — MIDAZOLAM HCL 2 MG/2ML IJ SOLN
INTRAMUSCULAR | Status: AC
  Filled 2021-10-18: qty 2

## 2021-10-18 MED ORDER — PHENYLEPHRINE 80 MCG/ML (10ML) SYRINGE FOR IV PUSH (FOR BLOOD PRESSURE SUPPORT)
PREFILLED_SYRINGE | INTRAVENOUS | Status: DC | PRN
  Administered 2021-10-18 (×2): 80 ug via INTRAVENOUS

## 2021-10-18 MED ORDER — FENTANYL CITRATE (PF) 100 MCG/2ML IJ SOLN
INTRAMUSCULAR | Status: AC
  Filled 2021-10-18: qty 2

## 2021-10-18 MED ORDER — ACETAMINOPHEN 500 MG PO TABS
ORAL_TABLET | ORAL | Status: AC
  Filled 2021-10-18: qty 2

## 2021-10-18 MED ORDER — ACETAMINOPHEN 500 MG PO TABS
1000.0000 mg | ORAL_TABLET | Freq: Once | ORAL | Status: AC
  Administered 2021-10-18: 1000 mg via ORAL

## 2021-10-18 MED ORDER — 0.9 % SODIUM CHLORIDE (POUR BTL) OPTIME
TOPICAL | Status: DC | PRN
  Administered 2021-10-18: 500 mL

## 2021-10-18 MED ORDER — MIDAZOLAM HCL 5 MG/5ML IJ SOLN
INTRAMUSCULAR | Status: DC | PRN
  Administered 2021-10-18: 2 mg via INTRAVENOUS

## 2021-10-18 MED ORDER — LIDOCAINE 2% (20 MG/ML) 5 ML SYRINGE
INTRAMUSCULAR | Status: DC | PRN
  Administered 2021-10-18: 60 mg via INTRAVENOUS

## 2021-10-18 MED ORDER — PROPOFOL 10 MG/ML IV BOLUS
INTRAVENOUS | Status: DC | PRN
  Administered 2021-10-18: 200 mg via INTRAVENOUS
  Administered 2021-10-18: 150 mg via INTRAVENOUS

## 2021-10-18 MED ORDER — LACTATED RINGERS IV SOLN: INTRAVENOUS | Status: DC

## 2021-10-18 MED ORDER — PROMETHAZINE HCL 25 MG/ML IJ SOLN: 6.2500 mg | INTRAMUSCULAR | Status: DC | PRN

## 2021-10-18 MED ORDER — PHENYLEPHRINE 80 MCG/ML (10ML) SYRINGE FOR IV PUSH (FOR BLOOD PRESSURE SUPPORT)
PREFILLED_SYRINGE | INTRAVENOUS | Status: AC
  Filled 2021-10-18: qty 10

## 2021-10-18 MED ORDER — GLYCOPYRROLATE PF 0.2 MG/ML IJ SOSY
PREFILLED_SYRINGE | INTRAMUSCULAR | Status: DC | PRN
  Administered 2021-10-18: .2 mg via INTRAVENOUS

## 2021-10-18 MED ORDER — DEXAMETHASONE SODIUM PHOSPHATE 10 MG/ML IJ SOLN
INTRAMUSCULAR | Status: AC
  Filled 2021-10-18: qty 1

## 2021-10-18 MED ORDER — IOHEXOL 300 MG/ML  SOLN
INTRAMUSCULAR | Status: DC | PRN
  Administered 2021-10-18: 12 mL

## 2021-10-18 MED ORDER — FENTANYL CITRATE (PF) 100 MCG/2ML IJ SOLN: 25.0000 ug | INTRAMUSCULAR | Status: DC | PRN

## 2021-10-18 MED ORDER — EPHEDRINE SULFATE-NACL 50-0.9 MG/10ML-% IV SOSY
PREFILLED_SYRINGE | INTRAVENOUS | Status: DC | PRN
  Administered 2021-10-18: 10 mg via INTRAVENOUS
  Administered 2021-10-18: 5 mg via INTRAVENOUS
  Administered 2021-10-18: 10 mg via INTRAVENOUS

## 2021-10-18 MED ORDER — FENTANYL CITRATE (PF) 100 MCG/2ML IJ SOLN
INTRAMUSCULAR | Status: DC | PRN
  Administered 2021-10-18: 100 ug via INTRAVENOUS
  Administered 2021-10-18: 25 ug via INTRAVENOUS

## 2021-10-18 MED ORDER — EPHEDRINE 5 MG/ML INJ
INTRAVENOUS | Status: AC
  Filled 2021-10-18: qty 5

## 2021-10-18 SURGICAL SUPPLY — 21 items
BAG DRAIN URO-CYSTO SKYTR STRL (DRAIN) ×1 IMPLANT
BAG DRN UROCATH (DRAIN) ×1
BASKET ZERO TIP NITINOL 2.4FR (BASKET) IMPLANT
BSKT STON RTRVL ZERO TP 2.4FR (BASKET) ×1
CATH URETL OPEN END 6FR 70 (CATHETERS) IMPLANT
CLOTH BEACON ORANGE TIMEOUT ST (SAFETY) ×1 IMPLANT
FIBER LASER FLEXIVA 365 (UROLOGICAL SUPPLIES) IMPLANT
GLOVE BIO SURGEON STRL SZ7.5 (GLOVE) ×1 IMPLANT
GOWN STRL REUS W/TWL XL LVL3 (GOWN DISPOSABLE) IMPLANT
GUIDEWIRE STR DUAL SENSOR (WIRE) ×1 IMPLANT
IV NS IRRIG 3000ML ARTHROMATIC (IV SOLUTION) ×1 IMPLANT
KIT TURNOVER CYSTO (KITS) ×1 IMPLANT
MANIFOLD NEPTUNE II (INSTRUMENTS) ×1 IMPLANT
NS IRRIG 500ML POUR BTL (IV SOLUTION) ×1 IMPLANT
PACK CYSTO (CUSTOM PROCEDURE TRAY) ×1 IMPLANT
SHEATH URETERAL 12FRX35CM (MISCELLANEOUS) IMPLANT
STENT URET 6FRX24 CONTOUR (STENTS) IMPLANT
TRACTIP FLEXIVA PULS ID 200XHI (Laser) IMPLANT
TRACTIP FLEXIVA PULSE ID 200 (Laser) ×1
TUBE CONNECTING 12X1/4 (SUCTIONS) IMPLANT
TUBING UROLOGY SET (TUBING) IMPLANT

## 2021-10-18 NOTE — Op Note (Signed)
Operative Note  Preoperative diagnosis:  1.  Bilateral renal calculi  Postoperative diagnosis: 1.  Bilateral renal calculi  Procedure(s): 1.  Cystoscopy with bilateral retrograde pyelogram, bilateral ureteroscopy with laser lithotripsy, left ureteroscopic stone extraction, right ureteral stent exchange, left ureteral stent placement  Surgeon: Link Snuffer, MD  Assistants: None  Anesthesia: Normal  Complications: None immediate  EBL: Minimal  Specimens: 1.  Renal calculus  Drains/Catheters: 1.  Bilateral 6 x 24 double-J ureteral stent  Intraoperative findings: 1.  Normal urethra and bladder 2.  Right ureteroscopy revealed no ureteral calculi.  She had 1 large 1 cm calculus that was laser fragmented to tiny fragments on dust settings.  Smaller calculus in the upper pole fragmented to tiny fragments on dust settings.  Retrograde pyelogram revealed no filling defect and no hydronephrosis after treatment of stone. 3.  Left ureteroscopy revealed no ureteral calculi.  She had 2 upper pole calculi, midpole calculus, and the larger lower pole calculus.  Upper pole and midpole calculi were fragmented to tiny fragments on dust settings and the lower pole calculus was laser fragmented and basket extracted.  Retrograde pyelogram revealed no evidence of filling defect and no hydronephrosis after treatment of the stone.  Indication: 26 year old female status post urgent right ureteral stent during pregnancy for an obstructing right renal pelvic calculus and severe pain.  She presents for definitive management of her stone.  Description of procedure:  The patient was identified and consent was obtained.  The patient was taken to the operating room and placed in the supine position.  The patient was placed under enteral anesthesia.  Perioperative antibiotics were administered.  The patient was placed in dorsal lithotomy.  Patient was prepped and draped in a standard sterile fashion and a timeout was  performed.  A 21 French rigid cystoscope was advanced into the urethra and into the bladder.  The stent on the right was grasped and pulled just beyond the meatus.  A wire was advanced through the stent up to the kidney under fluoroscopic guidance.  Stent was withdrawn and wire was secured to the drape.  Semirigid ureteroscopy was performed alongside the wire up to the renal pelvis.  No ureteral calculi were seen.  I passed a second wire through the scope and into the kidney under fluoroscopic guidance.  Scope was withdrawn.  I then advanced an 11 x 13 ureteral access sheath over the wire under continuous fluoroscopic guidance up to the renal pelvis.  Inner sheath and wire were withdrawn.  Digital ureteroscopy was performed and the stones were treated on dust settings.  All stones were laser fragmented to tiny fragments on dust settings until no clinically significant stone fragments remained.  I then shot a retrograde pyelogram through the scope with no abnormal findings.  I withdrew the scope along with the access sheath visualizing the entire ureter upon removal.  There were no ureteral calculi and no ureteral injury was identified.  I backloaded the wire onto rigid cystoscope and advanced that into the bladder followed by routine placement of a 6 x 24 double-J ureteral stent.  Fluoroscopy confirmed proximal placement and direct visualization confirmed a good coil within the bladder.  I drained the bladder.  I advanced a sensor wire up the left ureter and into the kidney under fluoroscopic guidance.  A second wire was advanced alongside this and into the kidney under fluoroscopic guidance.  Scope was withdrawn.  One of the wires was secured to the drape as a safety wire.  The other wire was used to advance an 11 x 13 ureteral access sheath over the wire under continuous fluoroscopic guidance up to the renal pelvis.  Inner sheath and wire were withdrawn.  Digital ureteroscopy was performed and several stones  were laser fragmented on dust settings to tiny fragments.  Lower pole calculus was difficult to access with the scope and was fragmented to smaller fragments and then was able to be basket extracted until no clinically significant stone fragments remained.  I collected one of the stones for specimen.  I reinspected the kidney and did not identify any other clinically significant stone fragments.  A couple of the smaller fragments were basket extracted until no other fragments large enough for basket extraction remained.  I shot a retrograde pyelogram through the scope with no abnormal findings.  I withdrew the scope along with the access sheath visualizing the entire ureter upon removal.  There were no ureteral calculi and no ureteral injury was identified.  I backloaded the wire onto rigid cystoscope and advanced that into the bladder followed by routine placement of a 6 x 24 double-J ureteral stent.  Fluoroscopy confirmed proximal placement and direct visualization confirmed a good coil within the bladder.  I drained the bladder and withdrew the scope.  Patient tolerated the procedure well was stable postoperative.  Plan: Follow-up in 1 week for stent removal

## 2021-10-18 NOTE — Transfer of Care (Signed)
Immediate Anesthesia Transfer of Care Note  Patient: Carol Pugh  Procedure(s) Performed: CYSTOSCOPY/URETEROSCOPY/HOLMIUM LASER/STENT PLACEMENT BILATERAL RETROGRADE PYELOGRAM  RIGHT STENT EXCHANGE/LEFT STENT PLACEMENT (Bilateral: Pelvis)  Patient Location: PACU  Anesthesia Type:General  Level of Consciousness: drowsy, patient cooperative and responds to stimulation  Airway & Oxygen Therapy: Patient Spontanous Breathing  Post-op Assessment: Report given to RN and Post -op Vital signs reviewed and stable  Post vital signs: Reviewed and stable  Last Vitals:  Vitals Value Taken Time  BP 143/95 10/18/21 1447  Temp    Pulse 73 10/18/21 1449  Resp 23 10/18/21 1449  SpO2 100 % 10/18/21 1449  Vitals shown include unvalidated device data.  Last Pain:  Vitals:   10/18/21 1205  TempSrc: Oral         Complications: No notable events documented.

## 2021-10-18 NOTE — H&P (Signed)
CC/HPI: Patient is followed by Dr. Alvis Lemmings and is a multiple stone former. 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 She is [redacted] weeks pregnant. She can not pads 5/10 stones when she passes them. She passed 2 in the last 36 hours. Pain on right flank a little bit better but still present radiating to right lower quadrant. She felt nauseated this morning but no vomiting. No fever   There is no other aggravating or relieving factors  There is no other associated signs and symptoms  The severity of the symptoms is moderate  The symptoms are ongoing and bothersome   Stones brought in and sent for analysis and urine sent for culture   Patient underwent renal ultrasound. She had approximately 6 smaller stones on the right and approximately 4 on the left quickly reviewed. I did not see any hydronephrosis but perhaps a little bit of fullness on the right with some of the views. Renal cortex looked healthy. Reassurance given. Follow up with her urologist and control medication with pain medicine and speak to gynecologist because she is pregnant for pain management if needed. Hopefully pain will improve passing 2 stones in the last 36 hours   She will call her gynecologist   12/01/2018: Seen last on 10/30. Her u/s showed some fullness on the right likely physiological due to ongoing pregnancy. Now at 18 weeks. Stone analysis from that visit resulted mostly as a calcium phosphate stone. Urine culture was positive and patient was treated for infection with cephalexin. She returns today for repeat exam, repeat renal ultrasound. Denies any interval stone material passage. Last week she had an episode of flank pain that lasted about 1 hour and then resolved without needing to take any pain medication. No recurrence since then. Grossly voiding at her baseline without bothersome frequency/urgency, trouble starting her stream, dysuria, visible blood in the urine. Denies any interval fevers or chills, nausea/vomiting.    04/05/19: "Jamal" is [redacted] weeks pregnant and presents today with right flank pain. She has been seen at [facility] a few times for this over the last 2 weeks and the last time she was there she was given oxycodone for home to use for pain as well as nausea medication. Her renal US showed bilateral hydro likely related to pregnancy, but a ureteral stone was not excluded. She has been tolerating PO intake and urinating well. She does endorse some burning with urination as well as nausea. She denies visible hematuria, fevers, and chills. She understands limitations of radiology and procedures due to pregnancy and just wants reassurance that once she gives birth she will be able to have an intervention to decrease her discomfort.   09/25/2021: 26 year old female who is approximately 10 days postpartum presents today for follow-up regarding her 1 cm right-sided UPJ stone. Urgently stented due to pain 1 month ago. She presents today to discuss definitive stone intervention. She states that she has been fairly uncomfortable with her stent, however oxybutynin, Flomax and tramadol have helped. She is no longer needing Dilaudid for pain. She would like to proceed with bilateral ureteroscopy for goal of stone free. She did pass 2 stones while hospitalized. 1 prior to delivery and 1 after delivery and she brought these with her for analysis.     ALLERGIES: No Allergies    MEDICATIONS: Percocet  Aspir 81  Azo  Butalbital-Acetaminophen-Caffe  Epi Pen  Labetalol Hcl  Prenatal  Promethazine Hcl  Propranolol Hcl Er 60 mg capsule, extended release 24hr  Wellbutrin Xl  GU PSH: ESWL - 2019       PSH Notes: ureteroscopy  oral surgery     NON-GU PSH: No Non-GU PSH    GU PMH: Renal calculus - 2019, - 2019    NON-GU PMH: Cardiac murmur, unspecified Depression Hypertension    FAMILY HISTORY: Aortic Aneurysm - Father Kidney Stones - Grandmother   SOCIAL HISTORY: Marital Status: Married Preferred  Language: English; Race: White Current Smoking Status: Patient smokes.   Tobacco Use Assessment Completed: Used Tobacco in last 30 days? Drinks 3 caffeinated drinks per day. Patient's occupation Psychologist, educational.    REVIEW OF SYSTEMS:    GU Review Female:   Patient reports stream starts and stops, trouble starting your stream, and have to strain to urinate. Patient denies frequent urination, hard to postpone urination, burning /pain with urination, get up at night to urinate, leakage of urine, and being pregnant.  Gastrointestinal (Upper):   Patient denies nausea, vomiting, and indigestion/ heartburn.  Gastrointestinal (Lower):   Patient denies diarrhea and constipation.  Constitutional:   Patient denies fever, night sweats, weight loss, and fatigue.  Skin:   Patient denies skin rash/ lesion and itching.  Eyes:   Patient denies blurred vision and double vision.  Ears/ Nose/ Throat:   Patient denies sore throat and sinus problems.  Hematologic/Lymphatic:   Patient denies swollen glands and easy bruising.  Cardiovascular:   Patient denies leg swelling and chest pains.  Respiratory:   Patient denies cough and shortness of breath.  Endocrine:   Patient denies excessive thirst.  Musculoskeletal:   Patient denies back pain and joint pain.  Neurological:   Patient denies headaches and dizziness.  Psychologic:   Patient denies depression and anxiety.   Notes: Patient had stent's put in for kidney stones prior to her having her baby. She was told to keep the stent's in until after she deliverers and then she would have surgery to remove kidney stones.     VITAL SIGNS:      09/25/2021 03:14 PM  BP 132/89 mmHg  Pulse 106 /min  Temperature 98.0 F / 36.6 C   MULTI-SYSTEM PHYSICAL EXAMINATION:    Constitutional: Patient is postpartum and has a gravid abdomen.  Lymphatic: No enlargement, no tenderness of axillae, groin, neck lymph nodes.  Gastrointestinal: No mass, no tenderness, no rigidity, non  obese abdomen.      Complexity of Data:  Source Of History:  Patient  Records Review:   Previous Doctor Records, Previous Patient Records  Urine Test Review:   Urinalysis  X-Ray Review: KUB: Reviewed Films. Reviewed Report. Discussed With Patient.     PROCEDURES:         KUB - [id number]  A single view of the abdomen is obtained. Bilateral renal shadows are well visualized. There are multiple opacities within each renal shadow. There is a stent that appears to be in appropriate position within the right ureter. There are no opacities noted on either expected anatomical position of either ureter. Within the right renal shadow the largest opacity measures approximately 8 mm in the right lower pole. There are approximately 6 other opacities noted within the left renal shadow      . Patient confirmed No Neulasta OnPro Device.          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 Her stent is in good position. She is now postpartum and would like to undergo a bilateral ureteroscopy for goal of stone free. Stone intervention was discussed in detail today. For ureteroscopy, the patient understands that there is a chance for a staged procedure. Patient also understands that there is risk for bleeding, infection, injury to surrounding organs, and general risks of anesthesia. The patient also understands the placement of a stent and the risks of stent placement including, risk for infection, the risk for pain, and the risk for injury.    Signed by Daine Gravel, NP on 09/25/21 at 4:51 PM (EDT

## 2021-10-18 NOTE — Anesthesia Preprocedure Evaluation (Addendum)
Anesthesia Evaluation  Patient identified by MRN, date of birth, ID band Patient awake    Reviewed: Allergy & Precautions, NPO status , Patient's Chart, lab work & pertinent test results, reviewed documented beta blocker date and time   History of Anesthesia Complications Negative for: history of anesthetic complications  Airway Mallampati: II  TM Distance: >3 FB Neck ROM: Full    Dental  (+) Dental Advisory Given, Teeth Intact   Pulmonary former smoker,    Pulmonary exam normal        Cardiovascular hypertension, Pt. on home beta blockers and Pt. on medications Normal cardiovascular exam   '23 TTE - Aortic root measures within normal limits (37 mm) . Ascending aorta measures upper limits of normal. Normal EF, no significant valvulopathy      Neuro/Psych  Headaches, PSYCHIATRIC DISORDERS Anxiety Depression    GI/Hepatic Neg liver ROS, GERD  Controlled,  Endo/Other   Obesity   Renal/GU Renal disease     Musculoskeletal  Marfan syndrome    Abdominal   Peds  Hematology negative hematology ROS (+)   Anesthesia Other Findings   Reproductive/Obstetrics  PCOS 4 wks postpartum                            Anesthesia Physical Anesthesia Plan  ASA: 2  Anesthesia Plan: General   Post-op Pain Management: Tylenol PO (pre-op)*   Induction: Intravenous  PONV Risk Score and Plan: 3 and Treatment may vary due to age or medical condition, Ondansetron, Dexamethasone and Midazolam  Airway Management Planned: LMA  Additional Equipment: None  Intra-op Plan:   Post-operative Plan: Extubation in OR  Informed Consent: I have reviewed the patients History and Physical, chart, labs and discussed the procedure including the risks, benefits and alternatives for the proposed anesthesia with the patient or authorized representative who has indicated his/her understanding and acceptance.     Dental  advisory given  Plan Discussed with: CRNA and Anesthesiologist  Anesthesia Plan Comments:        Anesthesia Quick Evaluation

## 2021-10-18 NOTE — Anesthesia Procedure Notes (Signed)
Procedure Name: LMA Insertion Date/Time: 10/18/2021 1:24 PM  Performed by: Rogers Blocker, CRNAPre-anesthesia Checklist: Patient identified, Emergency Drugs available, Suction available and Patient being monitored Patient Re-evaluated:Patient Re-evaluated prior to induction Oxygen Delivery Method: Circle System Utilized Preoxygenation: Pre-oxygenation with 100% oxygen Induction Type: IV induction Ventilation: Mask ventilation without difficulty LMA: LMA inserted LMA Size: 4.0 Number of attempts: 1 Placement Confirmation: positive ETCO2 Tube secured with: Tape Dental Injury: Teeth and Oropharynx as per pre-operative assessment

## 2021-10-18 NOTE — Anesthesia Postprocedure Evaluation (Signed)
Anesthesia Post Note  Patient: Carol Pugh  Procedure(s) Performed: CYSTOSCOPY/URETEROSCOPY/HOLMIUM LASER/STENT PLACEMENT BILATERAL RETROGRADE PYELOGRAM  RIGHT STENT EXCHANGE/LEFT STENT PLACEMENT (Bilateral: Pelvis)     Patient location during evaluation: PACU Anesthesia Type: General Level of consciousness: awake and alert Pain management: pain level controlled Vital Signs Assessment: post-procedure vital signs reviewed and stable Respiratory status: spontaneous breathing, nonlabored ventilation, respiratory function stable and patient connected to nasal cannula oxygen Cardiovascular status: blood pressure returned to baseline and stable Postop Assessment: no apparent nausea or vomiting Anesthetic complications: no   No notable events documented.  Last Vitals:  Vitals:   10/18/21 1500 10/18/21 1515  BP: (!) 137/97 (!) 144/101  Pulse: 68 65  Resp: 18 15  Temp:    SpO2: 100% 99%    Last Pain:  Vitals:   10/18/21 1515  TempSrc:   PainSc: 0-No pain                 Effie Berkshire

## 2021-10-18 NOTE — Discharge Instructions (Addendum)
[facility] [phone] Post Ureteroscopy With or Without Stent Instructions  Definitions:  Ureter: The duct that transports urine from the kidney to the bladder. Stent:   A plastic hollow tube that is placed into the ureter, from the kidney to the                 bladder to prevent the ureter from swelling shut.  GENERAL INSTRUCTIONS:  Despite the fact that no skin incisions were used, the area around the ureter and bladder is raw and irritated. The stent is a foreign body which will further irritate the bladder wall. This irritation is manifested by increased frequency of urination, both day and night, and by an increase in the urge to urinate. In some, the urge to urinate is present almost always. Sometimes the urge is strong enough that you may not be able to stop yourself from urinating. The only real cure is to remove the stent and then give time for the bladder wall to heal which can't be done until the danger of the ureter swelling shut has passed, which varies.  You may see some blood in your urine while the stent is in place and a few days afterwards. Do not be alarmed, even if the urine was clear for a while. Get off your feet and drink lots of fluids until clearing occurs. If you start to pass clots or don't improve, call us.  DIET: You may return to your normal diet immediately. Because of the raw surface of your bladder, alcohol, spicy foods, acid type foods and drinks with caffeine may cause irritation or frequency and should be used in moderation. To keep your urine flowing freely and to avoid constipation, drink plenty of fluids during the day ( 8-10 glasses ). Tip: Avoid cranberry juice because it is very acidic.  ACTIVITY: Your physical activity doesn't need to be restricted. However, if you are very active, you may see some blood in your urine. We suggest that you reduce your activity under these circumstances until the bleeding has stopped.  BOWELS: It is  important to keep your bowels regular during the postoperative period. Straining with bowel movements can cause bleeding. A bowel movement every other day is reasonable. Use a mild laxative if needed, such as Milk of Magnesia 2-3 tablespoons, or 2 Dulcolax tablets. Call if you continue to have problems. If you have been taking narcotics for pain, before, during or after your surgery, you may be constipated. Take a laxative if necessary.   MEDICATION: You should resume your pre-surgery medications unless told not to. You may take oxybutynin or flomax if prescribed for bladder spasms or discomfort from the stent Take pain medication as directed for pain refractory to conservative management  PROBLEMS YOU SHOULD REPORT TO US: Fevers over 100.5 Fahrenheit. Heavy bleeding, or clots ( See above notes about blood in urine ). Inability to urinate. Drug reactions ( hives, rash, nausea, vomiting, diarrhea ). Severe burning or pain with urination that is not improving.   Post Anesthesia Home Care Instructions  Activity: Get plenty of rest for the remainder of the day. A responsible individual must stay with you for 24 hours following the procedure.  For the next 24 hours, DO NOT: -Drive a car -Operate machinery -Drink alcoholic beverages -Take any medication unless instructed by your physician -Make any legal decisions or sign important papers.  Meals: Start with liquid foods such as gelatin or soup. Progress to regular foods as tolerated. Avoid greasy, spicy,   heavy foods. If nausea and/or vomiting occur, drink only clear liquids until the nausea and/or vomiting subsides. Call your physician if vomiting continues.  Special Instructions/Symptoms: Your throat may feel dry or sore from the anesthesia or the breathing tube placed in your throat during surgery. If this causes discomfort, gargle with warm salt water. The discomfort should disappear within 24 hours.      No acetaminophen/Tylenol  until after 6:15 pm today if needed.

## 2021-10-21 ENCOUNTER — Encounter (HOSPITAL_BASED_OUTPATIENT_CLINIC_OR_DEPARTMENT_OTHER): Payer: Self-pay | Admitting: Urology
